# Patient Record
Sex: Female | Born: 1990 | Race: White | Hispanic: Yes | Smoking: Former smoker
Health system: Southern US, Community
[De-identification: ages and names within clinical notes are randomized; demographics above are authoritative.]

## PROBLEM LIST (undated history)

## (undated) ENCOUNTER — Inpatient Hospital Stay (HOSPITAL_COMMUNITY): Payer: Self-pay

## (undated) DIAGNOSIS — J45909 Unspecified asthma, uncomplicated: Secondary | ICD-10-CM

## (undated) DIAGNOSIS — O139 Gestational [pregnancy-induced] hypertension without significant proteinuria, unspecified trimester: Secondary | ICD-10-CM

## (undated) DIAGNOSIS — G43909 Migraine, unspecified, not intractable, without status migrainosus: Secondary | ICD-10-CM

## (undated) DIAGNOSIS — M1990 Unspecified osteoarthritis, unspecified site: Secondary | ICD-10-CM

## (undated) HISTORY — DX: Gestational (pregnancy-induced) hypertension without significant proteinuria, unspecified trimester: O13.9

## (undated) HISTORY — PX: TONSILLECTOMY/ADENOIDECTOMY/TURBINATE REDUCTION: SHX6126

## (undated) HISTORY — PX: TONSILLECTOMY AND ADENOIDECTOMY: SUR1326

---
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
## 2011-01-30 ENCOUNTER — Other Ambulatory Visit: Payer: Self-pay | Admitting: Family Medicine

## 2011-01-30 DIAGNOSIS — Z3682 Encounter for antenatal screening for nuchal translucency: Secondary | ICD-10-CM

## 2011-01-30 LAB — HIV ANTIBODY (ROUTINE TESTING W REFLEX): HIV: NONREACTIVE

## 2011-01-30 LAB — RUBELLA ANTIBODY, IGM: Rubella: IMMUNE

## 2011-01-30 LAB — HEPATITIS B SURFACE ANTIGEN: Hepatitis B Surface Ag: NEGATIVE

## 2011-01-30 LAB — ABO/RH: RH Type: POSITIVE

## 2011-02-15 ENCOUNTER — Encounter (HOSPITAL_COMMUNITY): Payer: Self-pay

## 2011-02-15 ENCOUNTER — Ambulatory Visit (HOSPITAL_COMMUNITY)
Admission: RE | Admit: 2011-02-15 | Discharge: 2011-02-15 | Disposition: A | Discharge status: Home / Self Care | Payer: Medicaid Other | Source: Ambulatory Visit | Attending: Family Medicine | Admitting: Family Medicine

## 2011-02-15 ENCOUNTER — Ambulatory Visit (HOSPITAL_COMMUNITY): Admission: RE | Admit: 2011-02-15 | Payer: Medicaid Other | Source: Ambulatory Visit

## 2011-02-15 ENCOUNTER — Other Ambulatory Visit: Payer: Self-pay | Admitting: Family Medicine

## 2011-02-15 VITALS — BP 119/80 | HR 65 | Wt 172.0 lb

## 2011-02-15 DIAGNOSIS — Z3682 Encounter for antenatal screening for nuchal translucency: Secondary | ICD-10-CM

## 2011-02-15 DIAGNOSIS — Z3689 Encounter for other specified antenatal screening: Secondary | ICD-10-CM | POA: Insufficient documentation

## 2011-02-15 NOTE — Progress Notes (Signed)
Ultrasound in [facility]/OBGYN/EPIC.  Follow up U/S scheduled 

## 2011-03-01 ENCOUNTER — Ambulatory Visit (HOSPITAL_COMMUNITY)
Admission: RE | Admit: 2011-03-01 | Discharge: 2011-03-01 | Disposition: A | Discharge status: Home / Self Care | Payer: Medicaid Other | Source: Ambulatory Visit | Attending: Family Medicine | Admitting: Family Medicine

## 2011-03-01 ENCOUNTER — Other Ambulatory Visit (HOSPITAL_COMMUNITY): Payer: Self-pay | Admitting: Obstetrics and Gynecology

## 2011-03-01 ENCOUNTER — Ambulatory Visit (HOSPITAL_COMMUNITY): Admission: RE | Admit: 2011-03-01 | Payer: Medicaid Other | Source: Ambulatory Visit

## 2011-03-01 VITALS — BP 126/84 | HR 76

## 2011-03-01 DIAGNOSIS — Z3682 Encounter for antenatal screening for nuchal translucency: Secondary | ICD-10-CM

## 2011-03-01 DIAGNOSIS — O351XX0 Maternal care for (suspected) chromosomal abnormality in fetus, not applicable or unspecified: Secondary | ICD-10-CM | POA: Insufficient documentation

## 2011-03-01 DIAGNOSIS — O3510X0 Maternal care for (suspected) chromosomal abnormality in fetus, unspecified, not applicable or unspecified: Secondary | ICD-10-CM | POA: Insufficient documentation

## 2011-03-01 DIAGNOSIS — Z3689 Encounter for other specified antenatal screening: Secondary | ICD-10-CM | POA: Insufficient documentation

## 2011-03-01 NOTE — Progress Notes (Signed)
Ultrasound in [facility]/OBGYN/EPIC.  Follow up U/S scheduled 

## 2011-03-05 LAB — CBC
HCT: 38.8
Hemoglobin: 13.4
MCV: 90.1
Platelets: 320
RDW: 13.1

## 2011-03-07 LAB — HEPATIC FUNCTION PANEL
ALT: 67 — ABNORMAL HIGH
AST: 81 — ABNORMAL HIGH
Bilirubin, Direct: <0.1
Total Bilirubin: 0.6

## 2011-03-07 LAB — CBC
HCT: 38.4
Hemoglobin: 13.2
MCV: 88.2
Platelets: 283
RDW: 13.5

## 2011-03-07 LAB — DIFFERENTIAL
Basophils Absolute: 0
Eosinophils Absolute: 0.2
Eosinophils Relative: 1
Lymphocytes Relative: 17 — ABNORMAL LOW
Lymphs Abs: 2.3
Monocytes Absolute: 0.8

## 2011-03-07 LAB — POCT I-STAT, CHEM 8
Calcium, Ion: 1.17
Glucose, Bld: 105 — ABNORMAL HIGH
HCT: 40
Hemoglobin: 13.6

## 2011-03-07 LAB — POCT PREGNANCY, URINE: Operator id: 277751

## 2011-03-07 NOTE — Progress Notes (Signed)
Encounter addended by: Marlana Latus, RN on: 03/07/2011 10:09 AM<BR>     Documentation filed: Episodes, Chief Complaint Section

## 2011-03-08 ENCOUNTER — Ambulatory Visit (HOSPITAL_COMMUNITY): Payer: Medicaid Other

## 2011-03-11 ENCOUNTER — Ambulatory Visit (HOSPITAL_COMMUNITY)
Admission: RE | Admit: 2011-03-11 | Discharge: 2011-03-11 | Disposition: A | Discharge status: Home / Self Care | Payer: Medicaid Other | Source: Ambulatory Visit | Attending: Family Medicine | Admitting: Family Medicine

## 2011-03-11 ENCOUNTER — Other Ambulatory Visit: Payer: Self-pay

## 2011-03-11 DIAGNOSIS — O3510X0 Maternal care for (suspected) chromosomal abnormality in fetus, unspecified, not applicable or unspecified: Secondary | ICD-10-CM | POA: Insufficient documentation

## 2011-03-11 DIAGNOSIS — Z3682 Encounter for antenatal screening for nuchal translucency: Secondary | ICD-10-CM

## 2011-03-11 DIAGNOSIS — Z3689 Encounter for other specified antenatal screening: Secondary | ICD-10-CM | POA: Insufficient documentation

## 2011-03-11 DIAGNOSIS — Z331 Pregnant state, incidental: Secondary | ICD-10-CM

## 2011-03-11 DIAGNOSIS — O351XX0 Maternal care for (suspected) chromosomal abnormality in fetus, not applicable or unspecified: Secondary | ICD-10-CM | POA: Insufficient documentation

## 2011-03-11 NOTE — Progress Notes (Signed)
Ultrasound in [facility]/OBGYN/EPIC.  Follow up U/S scheduled 

## 2011-03-11 NOTE — Progress Notes (Signed)
Encounter addended by: Marlana Latus, RN on: 03/11/2011  1:14 PM<BR>     Documentation filed: Charges VN

## 2011-04-22 ENCOUNTER — Other Ambulatory Visit: Payer: Self-pay | Admitting: Family Medicine

## 2011-04-22 ENCOUNTER — Ambulatory Visit (HOSPITAL_COMMUNITY)
Admission: RE | Admit: 2011-04-22 | Discharge: 2011-04-22 | Disposition: A | Discharge status: Home / Self Care | Payer: Medicaid Other | Source: Ambulatory Visit | Attending: Family Medicine | Admitting: Family Medicine

## 2011-04-22 ENCOUNTER — Other Ambulatory Visit (HOSPITAL_COMMUNITY): Payer: Self-pay | Admitting: Family Medicine

## 2011-04-22 DIAGNOSIS — E669 Obesity, unspecified: Secondary | ICD-10-CM | POA: Insufficient documentation

## 2011-04-22 DIAGNOSIS — Z331 Pregnant state, incidental: Secondary | ICD-10-CM

## 2011-04-22 DIAGNOSIS — O358XX0 Maternal care for other (suspected) fetal abnormality and damage, not applicable or unspecified: Secondary | ICD-10-CM | POA: Insufficient documentation

## 2011-04-22 DIAGNOSIS — Z1389 Encounter for screening for other disorder: Secondary | ICD-10-CM | POA: Insufficient documentation

## 2011-04-22 DIAGNOSIS — Z363 Encounter for antenatal screening for malformations: Secondary | ICD-10-CM | POA: Insufficient documentation

## 2011-05-17 ENCOUNTER — Encounter (HOSPITAL_COMMUNITY): Payer: Self-pay | Admitting: Physical Medicine and Rehabilitation

## 2011-05-17 ENCOUNTER — Emergency Department (HOSPITAL_COMMUNITY): Payer: Medicaid Other

## 2011-05-17 ENCOUNTER — Emergency Department (HOSPITAL_COMMUNITY)
Admission: EM | Admit: 2011-05-17 | Discharge: 2011-05-17 | Disposition: A | Discharge status: Home / Self Care | Payer: Medicaid Other

## 2011-05-17 ENCOUNTER — Emergency Department (HOSPITAL_COMMUNITY)
Admission: EM | Admit: 2011-05-17 | Discharge: 2011-05-17 | Disposition: A | Discharge status: Home / Self Care | Payer: Medicaid Other | Attending: Emergency Medicine | Admitting: Emergency Medicine

## 2011-05-17 DIAGNOSIS — R059 Cough, unspecified: Secondary | ICD-10-CM | POA: Insufficient documentation

## 2011-05-17 DIAGNOSIS — R05 Cough: Secondary | ICD-10-CM | POA: Insufficient documentation

## 2011-05-17 DIAGNOSIS — J069 Acute upper respiratory infection, unspecified: Secondary | ICD-10-CM | POA: Insufficient documentation

## 2011-05-17 DIAGNOSIS — R509 Fever, unspecified: Secondary | ICD-10-CM | POA: Insufficient documentation

## 2011-05-17 DIAGNOSIS — R109 Unspecified abdominal pain: Secondary | ICD-10-CM | POA: Insufficient documentation

## 2011-05-17 DIAGNOSIS — O99891 Other specified diseases and conditions complicating pregnancy: Secondary | ICD-10-CM | POA: Insufficient documentation

## 2011-05-17 DIAGNOSIS — J40 Bronchitis, not specified as acute or chronic: Secondary | ICD-10-CM | POA: Insufficient documentation

## 2011-05-17 DIAGNOSIS — R6889 Other general symptoms and signs: Secondary | ICD-10-CM | POA: Insufficient documentation

## 2011-05-17 LAB — BASIC METABOLIC PANEL
BUN: 6 mg/dL (ref 6–23)
CO2: 23 mEq/L (ref 19–32)
Chloride: 100 mEq/L (ref 96–112)
GFR calc Af Amer: >90 mL/min (ref >90)
Glucose, Bld: 101 mg/dL — ABNORMAL HIGH (ref 70–99)
Potassium: 3.7 mEq/L (ref 3.5–5.1)

## 2011-05-17 LAB — URINALYSIS, ROUTINE W REFLEX MICROSCOPIC
Bilirubin Urine: NEGATIVE
Ketones, ur: 15 mg/dL — AB
Nitrite: NEGATIVE
Protein, ur: NEGATIVE mg/dL
Urobilinogen, UA: 1 mg/dL (ref 0.0–1.0)

## 2011-05-17 LAB — URINE MICROSCOPIC-ADD ON

## 2011-05-17 MED ORDER — SODIUM CHLORIDE 0.9 % IV BOLUS (SEPSIS)
1000.0000 mL | Freq: Once | INTRAVENOUS | Status: AC
  Administered 2011-05-17: 1000 mL via INTRAVENOUS

## 2011-05-17 MED ORDER — BENZONATATE 100 MG PO CAPS: 100.0000 mg | ORAL_CAPSULE | Freq: Three times a day (TID) | ORAL | Status: AC

## 2011-05-17 NOTE — ED Provider Notes (Signed)
History     CSN: [id number] Arrival date & time: 05/17/2011  3:23 PM   First MD Initiated Contact with Patient 05/17/11 1752      Chief Complaint  Patient presents with  . URI  . Abdominal Pain    (Consider location/radiation/quality/duration/timing/severity/associated sxs/prior treatment) Patient is a 20 y.o. female presenting with URI and abdominal pain. The history is provided by the patient.  URI The primary symptoms include fever, cough and abdominal pain. Primary symptoms do not include headaches, nausea, vomiting or rash.  The illness is not associated with chills or congestion.  Abdominal Pain The primary symptoms of the illness include abdominal pain and fever. The primary symptoms of the illness do not include shortness of breath, nausea, vomiting or dysuria.  Symptoms associated with the illness do not include chills, diaphoresis or back pain.   the patient is a 20 year old, female, who is 82 weeks estimated gestational age, who presents to the hospital complaining of cough and sneezing, which is causing her to have abdominal pain.  She had a fever.  Prior to arrival.  She denies vomiting, diarrhea, or urinary tract symptoms.  She denies pain in her abdomen except when she coughs or sneezes.  She only takes prenatal vitamins.  She has no allergies to medications.  History reviewed. No pertinent past medical history.  Past Surgical History  Procedure Date  . Cholecystectomy   . Tonsillectomy     History reviewed. No pertinent family history.  History  Substance Use Topics  . Smoking status: Former Games developer  . Smokeless tobacco: Not on file  . Alcohol Use: No    OB History    Grav Para Term Preterm Abortions TAB SAB Ect Mult Living   2 1 1  0 0 0 0 0 0 1      Review of Systems  Constitutional: Positive for fever. Negative for chills and diaphoresis.  HENT: Negative for congestion and neck pain.   Eyes: Negative for redness.  Respiratory: Positive for cough.  Negative for chest tightness and shortness of breath.   Cardiovascular: Negative for chest pain.  Gastrointestinal: Positive for abdominal pain. Negative for nausea and vomiting.       Abdominal pain, only occurs when she coughs or sneezes  Genitourinary: Negative for dysuria.  Musculoskeletal: Negative for back pain.  Skin: Negative for rash.  Neurological: Negative for light-headedness, numbness and headaches.  Psychiatric/Behavioral: Negative for confusion.    Allergies  Review of patient's allergies indicates no known allergies.  Home Medications   Current Outpatient Rx  Name Route Sig Dispense Refill  . PRENATAL VITAMINS PO Oral Take by mouth.        BP 137/92  Pulse 112  Temp(Src) 98.1 F (36.7 C) (Oral)  Resp 18  SpO2 99%  LMP 11/23/2010  Physical Exam  Vitals reviewed. Constitutional: She is oriented to person, place, and time. She appears well-developed and well-nourished.  HENT:  Head: Normocephalic and atraumatic.  Eyes: Pupils are equal, round, and reactive to light.  Neck: Normal range of motion.  Cardiovascular: Regular rhythm and normal heart sounds.   No murmur heard.      Tachycardic  Pulmonary/Chest: Effort normal and breath sounds normal. No respiratory distress. She has no wheezes. She has no rales.  Abdominal:       gravid  Musculoskeletal: Normal range of motion. She exhibits no edema and no tenderness.  Neurological: She is alert and oriented to person, place, and time. No cranial nerve deficit.  Skin:  Skin is warm and dry. No rash noted. No erythema.  Psychiatric: She has a normal mood and affect. Her behavior is normal.    ED Course  Procedures (including critical care time)  20 year old, female, who is pregnant at approximately 72 weeks estimated gestational age.  Comes in with flulike symptoms.  We'll perform a chest x-ray to make sure she does not have a pneumonia since he tachycardic, old give her IV fluids, also do a blood and urine  tests, and determine whether or not.  She has signs of dehydration and ketones.  Labs Reviewed  URINALYSIS, ROUTINE W REFLEX MICROSCOPIC - Abnormal; Notable for the following:    Color, Urine AMBER (*) BIOCHEMICALS MAY BE AFFECTED BY COLOR   Ketones, ur 15 (*)    Leukocytes, UA TRACE (*)    All other components within normal limits  POCT PREGNANCY, URINE  URINE MICROSCOPIC-ADD ON  BASIC METABOLIC PANEL   I spoke with Dr. Vickey Sages.  She confirmed that I can give benzonatate to the pt to suppress her cough.    2 L IVF given.    MDM  URI Dehydration Pregnancy Nontoxic.  No signs of pneumonia.  No respiratory distress        Nicholes Stairs, MD 05/17/11 2204

## 2011-05-17 NOTE — ED Notes (Signed)
Pt presents to department for evaluation of cough, congestion, and abdominal pain. Symptoms started while sleeping around 4am. Pt states she has abdominal pain with coughing. Also states "I have a fever inside of me." states body aches and chills. Pt states she is x23 weeks pregnant. She is alert and oriented x4. No signs of distress at the time.

## 2011-05-17 NOTE — ED Notes (Signed)
FAMILY MEMBER IN WAITING CALLED BACK TO RM.

## 2011-06-11 NOTE — L&D Delivery Note (Signed)
Delivery Note At 6:51 PM a viable and healthy female was delivered via Vaginal, Spontaneous Delivery (Presentation: Left Occiput Anterior). After delivery of head, left axilla was grasped and rotated obliquely, at that point the anterior shoulder released and the baby delivered. APGAR: 9,9 ; weight 8lb 1 oz.  Placenta status: intact, .  Cord: 3 vessel with the following complications: none  Anesthesia: Epidural  Episiotomy: none Lacerations: 1st degree Est. Blood Loss (mL): 100  Mom to postpartum.  Baby to nursery-stable.  Drenda Freeze Cresenzo-Dishmon supervised the delivery.  Ala Dach 08/28/2011, 7:05 PM

## 2011-08-07 ENCOUNTER — Inpatient Hospital Stay (HOSPITAL_COMMUNITY)
Admission: AD | Admit: 2011-08-07 | Discharge: 2011-08-07 | Disposition: A | Discharge status: Home Health | Payer: Medicaid Other | Source: Ambulatory Visit | Attending: Obstetrics and Gynecology | Admitting: Obstetrics and Gynecology

## 2011-08-07 ENCOUNTER — Encounter (HOSPITAL_COMMUNITY): Payer: Self-pay | Admitting: *Deleted

## 2011-08-07 DIAGNOSIS — O479 False labor, unspecified: Secondary | ICD-10-CM

## 2011-08-07 DIAGNOSIS — R109 Unspecified abdominal pain: Secondary | ICD-10-CM | POA: Insufficient documentation

## 2011-08-07 DIAGNOSIS — O99891 Other specified diseases and conditions complicating pregnancy: Secondary | ICD-10-CM | POA: Insufficient documentation

## 2011-08-07 DIAGNOSIS — R1084 Generalized abdominal pain: Secondary | ICD-10-CM

## 2011-08-07 LAB — URINALYSIS, ROUTINE W REFLEX MICROSCOPIC
Bilirubin Urine: NEGATIVE
Glucose, UA: NEGATIVE mg/dL
Hgb urine dipstick: NEGATIVE
Ketones, ur: NEGATIVE mg/dL
Specific Gravity, Urine: <1.005 — ABNORMAL LOW (ref 1.005–1.030)
pH: 6 (ref 5.0–8.0)

## 2011-08-07 MED ORDER — NIFEDIPINE 10 MG PO CAPS: 10.0000 mg | ORAL_CAPSULE | Freq: Once | ORAL | Status: DC

## 2011-08-07 MED ORDER — NIFEDIPINE 10 MG PO CAPS
10.0000 mg | ORAL_CAPSULE | Freq: Once | ORAL | Status: AC
  Administered 2011-08-07: 10 mg via ORAL
  Filled 2011-08-07: qty 1

## 2011-08-07 NOTE — ED Provider Notes (Signed)
History     Chief Complaint  Patient presents with  . Abdominal Pain   HPI chief complaint is vaginal discharge. This 21 year old gravida 2 para 1001 at 34 weeks 3 days is seen for increased vaginal discharge gush of fluid or bleeding. She denies uterine contractions, she is concerned about the secretions. Last sexual activity is greater than one month. She has no history of preterm deliveries pregnancy care has been regular at the health department and was reported as normal without risk noted .  Pertinent Gynecological History: Menses: Pregnant Bleeding: None  History reviewed. No pertinent past medical history.  Past Surgical History  Procedure Date  . Cholecystectomy   . Tonsillectomy     History reviewed. No pertinent family history.  History  Substance Use Topics  . Smoking status: Former Games developer  . Smokeless tobacco: Never Used  . Alcohol Use: No    Allergies: No Known Allergies  Prescriptions prior to admission  Medication Sig Dispense Refill  . Prenatal Vit-Fe Fumarate-FA (PRENATAL MULTIVITAMIN) TABS Take 1 tablet by mouth every morning.        ROS Physical Exam    Blood pressure 134/89, pulse 96, resp. rate 20, last menstrual period 11/23/2010.  Physical ExamPhysical Examination: General appearance - alert, well appearing, and in no distress Mental status - normal mood, behavior, speech, dress, motor activity, and thought processes Abdomen - soft, nontender, nondistended, no masses or organomegaly Nontender uterus. Fetal monitoring shows reactive pattern category 1 fetal heart tracing there is no uterine activity Pelvic - VULVA: normal appearing vulva with no masses, tenderness or lesions, VAGINA: normal appearing vagina with normal color and discharge, no lesions, vaginal discharge - clear, white and copious, CERVIX: normal appearing cervix without discharge or lesions, soft multiparous digital exam shows cervix to be 1-2 cm posteriorly oriented soft 20  percent effaced, GC chlamydia and group B strep cultures were collected  UTERUS: enlarged to 34 week size, nontender   MAU Course  Procedures fetal monitoring and speculum exam, collection of cultures GC chlamydia and group B strep   MDM   Assessment and Plan   pregnancy 34 weeks 3 days, cervical ripening late preterm gestation, with no evidence of preterm labor, no evidence of membrane rupture.  Recommendations pelvic rest with avoidance of sexual activity Limited activity, weekly followup through the health department   reevaluation promptly here in MAU for any contractions bleeding or fluid Tiana Sivertson V 08/07/2011, 10:56 PM

## 2011-08-07 NOTE — Progress Notes (Signed)
Pt G2 P1 at 34.3wks having lower abd pain that comes and goes, denies bleeding.  Reports clear mucous today.

## 2011-08-09 LAB — GC/CHLAMYDIA PROBE AMP, GENITAL: Chlamydia, DNA Probe: NEGATIVE

## 2011-08-10 LAB — CULTURE, BETA STREP (GROUP B ONLY)

## 2011-08-21 LAB — STREP B DNA PROBE: GBS: NEGATIVE

## 2011-08-26 ENCOUNTER — Inpatient Hospital Stay (HOSPITAL_COMMUNITY)
Admission: AD | Admit: 2011-08-26 | Discharge: 2011-08-26 | Disposition: A | Discharge status: Home / Self Care | Payer: Medicaid Other | Source: Ambulatory Visit | Attending: Obstetrics & Gynecology | Admitting: Obstetrics & Gynecology

## 2011-08-26 ENCOUNTER — Encounter (HOSPITAL_COMMUNITY): Payer: Self-pay

## 2011-08-26 DIAGNOSIS — O133 Gestational [pregnancy-induced] hypertension without significant proteinuria, third trimester: Secondary | ICD-10-CM

## 2011-08-26 DIAGNOSIS — R1013 Epigastric pain: Secondary | ICD-10-CM | POA: Insufficient documentation

## 2011-08-26 DIAGNOSIS — O139 Gestational [pregnancy-induced] hypertension without significant proteinuria, unspecified trimester: Secondary | ICD-10-CM | POA: Insufficient documentation

## 2011-08-26 LAB — URIC ACID: Uric Acid, Serum: 3.6 mg/dL (ref 2.4–7.0)

## 2011-08-26 LAB — PROTEIN / CREATININE RATIO, URINE
Creatinine, Urine: 123.5 mg/dL
Protein Creatinine Ratio: 0.08 (ref 0.00–0.15)
Total Protein, Urine: 9.9 mg/dL

## 2011-08-26 LAB — URINALYSIS, ROUTINE W REFLEX MICROSCOPIC
Glucose, UA: NEGATIVE mg/dL
Hgb urine dipstick: NEGATIVE
Leukocytes, UA: NEGATIVE
Protein, ur: NEGATIVE mg/dL
pH: 7 (ref 5.0–8.0)

## 2011-08-26 LAB — COMPREHENSIVE METABOLIC PANEL
ALT: 16 U/L (ref 0–35)
AST: 18 U/L (ref 0–37)
Calcium: 9.4 mg/dL (ref 8.4–10.5)
Creatinine, Ser: 0.52 mg/dL (ref 0.50–1.10)
GFR calc Af Amer: >90 mL/min (ref >90)
Sodium: 135 mEq/L (ref 135–145)
Total Protein: 6.2 g/dL (ref 6.0–8.3)

## 2011-08-26 LAB — CBC
HCT: 36.2 % (ref 36.0–46.0)
Hemoglobin: 12.3 g/dL (ref 12.0–15.0)
MCH: 29.7 pg (ref 26.0–34.0)
MCHC: 34 g/dL (ref 30.0–36.0)
RBC: 4.14 MIL/uL (ref 3.87–5.11)

## 2011-08-26 MED ORDER — FAMOTIDINE 20 MG PO TABS
20.0000 mg | ORAL_TABLET | ORAL | Status: AC
  Administered 2011-08-26: 20 mg via ORAL
  Filled 2011-08-26: qty 1

## 2011-08-26 NOTE — MAU Note (Signed)
Patient states she had a sudden onset of upper abdominal pain about 30 minutes ago. Denies any contractions, leaking, or bleeding. Reports good fetal movement. No headaches but has had some "bubbles" before her eyes.

## 2011-08-26 NOTE — MAU Provider Note (Signed)
History     CSN: [id number]  Arrival date and time: 08/26/11 1830   First Provider Initiated Contact with Patient 08/26/11 1954     20 [id number].N.[id number]@ [redacted]w[redacted]d Chief Complaint  Patient presents with  . Abdominal Pain   HPI Pt presents with epigastric pain in upper abdomen/sternal area radiating to RUQ of abdomen.  This pain started about 1.5 hours ago.  She had "floating dots" in her vision 1 week ago that resolved.  She reports good fetal movement and denies contractions, LOF, vaginal bleeding, vaginal itching/burning/unusual discharge, urinary symptoms, h/a, dizziness, n/v, or fever/chills.    OB History    Grav Para Term Preterm Abortions TAB SAB Ect Mult Living   2 1 1  0 0 0 0 0 0 1      Past Medical History  Diagnosis Date  . No pertinent past medical history     Past Surgical History  Procedure Date  . Cholecystectomy   . Tonsillectomy   . Nasal septum surgery     Family History  Problem Relation Age of Onset  . Anesthesia problems Neg Hx     History  Substance Use Topics  . Smoking status: Former Games developer  . Smokeless tobacco: Never Used  . Alcohol Use: No    Allergies: No Known Allergies  Prescriptions prior to admission  Medication Sig Dispense Refill  . Prenatal Vit-Fe Fumarate-FA (PRENATAL MULTIVITAMIN) TABS Take 1 tablet by mouth every morning.        Review of Systems  Constitutional: Negative for fever, chills and malaise/fatigue.  Eyes: Negative for blurred vision.  Respiratory: Positive for shortness of breath. Negative for cough.   Cardiovascular: Negative for chest pain.  Gastrointestinal: Positive for heartburn and abdominal pain. Negative for nausea and vomiting.  Genitourinary: Negative for dysuria, urgency and frequency.  Musculoskeletal: Negative.   Neurological: Negative for dizziness and headaches.  Psychiatric/Behavioral: Negative for depression.   Physical Exam   Blood pressure 137/98, pulse 94, temperature 98.5 F (36.9 C),  temperature source Oral, resp. rate 20, height 5\' 5"  (1.651 m), weight 87.998 kg (194 lb), last menstrual period 11/23/2010, SpO2 98.00%.  Physical Exam  Nursing note and vitals reviewed. Constitutional: She is oriented to person, place, and time. She appears well-developed and well-nourished.  Neck: Normal range of motion.  Cardiovascular: Normal rate, regular rhythm and normal heart sounds.   Respiratory: Effort normal and breath sounds normal.  GI: Soft.  Musculoskeletal: Normal range of motion.  Neurological: She is alert and oriented to person, place, and time. She has normal reflexes.  Skin: Skin is warm and dry.  Psychiatric: She has a normal mood and affect. Her behavior is normal. Judgment and thought content normal.    MAU Course  Procedures U/A, P/C ratio, CBC, CMP    Assessment and Plan    LEFTWICH-KIRBY, LISA 08/26/2011, 7:54 PM   Care assumed by Dorathy Kinsman, CNM  Care of pt assumed at 2100.  Results for orders placed during the hospital encounter of 08/26/11 (from the past 24 hour(s))  URINALYSIS, ROUTINE W REFLEX MICROSCOPIC     Status: Normal   Collection Time   08/26/11  6:50 PM      Component Value Range   Color, Urine YELLOW  YELLOW    APPearance CLEAR  CLEAR    Specific Gravity, Urine 1.020  1.005 - 1.030    pH 7.0  5.0 - 8.0    Glucose, UA NEGATIVE  NEGATIVE (mg/dL)   Hgb urine dipstick NEGATIVE  NEGATIVE    Bilirubin Urine NEGATIVE  NEGATIVE    Ketones, ur NEGATIVE  NEGATIVE (mg/dL)   Protein, ur NEGATIVE  NEGATIVE (mg/dL)   Urobilinogen, UA 1.0  0.0 - 1.0 (mg/dL)   Nitrite NEGATIVE  NEGATIVE    Leukocytes, UA NEGATIVE  NEGATIVE   PROTEIN / CREATININE RATIO, URINE     Status: Normal   Collection Time   08/26/11  6:50 PM      Component Value Range   Creatinine, Urine 123.50     Total Protein, Urine 9.9     PROTEIN CREATININE RATIO 0.08  0.00 - 0.15   COMPREHENSIVE METABOLIC PANEL     Status: Abnormal   Collection Time   08/26/11  7:26 PM       Component Value Range   Sodium 135  135 - 145 (mEq/L)   Potassium 3.7  3.5 - 5.1 (mEq/L)   Chloride 101  96 - 112 (mEq/L)   CO2 22  19 - 32 (mEq/L)   Glucose, Bld 101 (*) 70 - 99 (mg/dL)   BUN 8  6 - 23 (mg/dL)   Creatinine, Ser 0.98  0.50 - 1.10 (mg/dL)   Calcium 9.4  8.4 - 11.9 (mg/dL)   Total Protein 6.2  6.0 - 8.3 (g/dL)   Albumin 2.5 (*) 3.5 - 5.2 (g/dL)   AST 18  0 - 37 (U/L)   ALT 16  0 - 35 (U/L)   Alkaline Phosphatase 303 (*) 39 - 117 (U/L)   Total Bilirubin 0.2 (*) 0.3 - 1.2 (mg/dL)   GFR calc non Af Amer >90  >90 (mL/min)   GFR calc Af Amer >90  >90 (mL/min)  CBC     Status: Normal   Collection Time   08/26/11  7:26 PM      Component Value Range   WBC 8.9  4.0 - 10.5 (K/uL)   RBC 4.14  3.87 - 5.11 (MIL/uL)   Hemoglobin 12.3  12.0 - 15.0 (g/dL)   HCT 14.7  82.9 - 56.2 (%)   MCV 87.4  78.0 - 100.0 (fL)   MCH 29.7  26.0 - 34.0 (pg)   MCHC 34.0  30.0 - 36.0 (g/dL)   RDW 13.0  86.5 - 78.4 (%)   Platelets 247  150 - 400 (K/uL)  URIC ACID     Status: Normal   Collection Time   08/26/11  7:26 PM      Component Value Range   Uric Acid, Serum 3.6  2.4 - 7.0 (mg/dL)   Pt reports mild, sharp, intermittent RUQ pain.   Patient Vitals for the past 24 hrs:  BP Temp Temp src Pulse Resp SpO2 Height Weight  08/26/11 2032 139/98 mmHg - - 89  - - - -  08/26/11 2017 142/112 mmHg - - 103  - - - -  08/26/11 2002 134/98 mmHg - - 104  - - - -  08/26/11 1947 128/91 mmHg - - 125  - - - -  08/26/11 1932 137/98 mmHg - - 94  - - - -  08/26/11 1917 145/92 mmHg - - 94  - - - -  08/26/11 1903 136/95 mmHg - - 104  - - - -  08/26/11 1841 137/102 mmHg 98.5 F (36.9 C) Oral 76  20  98 % 5\' 5"  (1.651 m) 87.998 kg (194 lb)   Dilation: 4 Effacement (%): 80 Cervical Position: Posterior Station: -3 Presentation: Vertex Exam by:: Ivonne Andrew, CNM  1. Gestational hypertension w/o  significant proteinuria in 3rd trimester    Discussed labs, BPs w/ Dr. Debroah Loop. Will send home w/ 24 hour urine  collections kit.  Instructed to return to [facility] 08/28/11. PIH and labor precautions Dot Been 08/26/2011 9:48 PM

## 2011-08-28 ENCOUNTER — Encounter (HOSPITAL_COMMUNITY): Payer: Self-pay | Admitting: Anesthesiology

## 2011-08-28 ENCOUNTER — Inpatient Hospital Stay (HOSPITAL_COMMUNITY)
Admission: AD | Admit: 2011-08-28 | Discharge: 2011-08-30 | DRG: 775 | Disposition: A | Discharge status: Home / Self Care | Payer: Medicaid Other | Attending: Obstetrics & Gynecology | Admitting: Obstetrics & Gynecology

## 2011-08-28 ENCOUNTER — Inpatient Hospital Stay (HOSPITAL_COMMUNITY): Payer: Medicaid Other | Admitting: Anesthesiology

## 2011-08-28 ENCOUNTER — Encounter (HOSPITAL_COMMUNITY): Payer: Self-pay | Admitting: *Deleted

## 2011-08-28 LAB — CBC
HCT: 36 % (ref 36.0–46.0)
MCHC: 34.4 g/dL (ref 30.0–36.0)
Platelets: 232 10*3/uL (ref 150–400)
RDW: 13.2 % (ref 11.5–15.5)
WBC: 10.1 10*3/uL (ref 4.0–10.5)

## 2011-08-28 LAB — COMPREHENSIVE METABOLIC PANEL
ALT: 17 U/L (ref 0–35)
AST: 19 U/L (ref 0–37)
Albumin: 2.4 g/dL — ABNORMAL LOW (ref 3.5–5.2)
Alkaline Phosphatase: 320 U/L — ABNORMAL HIGH (ref 39–117)
BUN: 8 mg/dL (ref 6–23)
Chloride: 101 mEq/L (ref 96–112)
Potassium: 4 mEq/L (ref 3.5–5.1)
Sodium: 134 mEq/L — ABNORMAL LOW (ref 135–145)
Total Bilirubin: 0.3 mg/dL (ref 0.3–1.2)
Total Protein: 5.9 g/dL — ABNORMAL LOW (ref 6.0–8.3)

## 2011-08-28 LAB — PROTEIN, URINE, 24 HOUR
Protein, 24H Urine: 80 mg/d (ref 50–100)
Urine Total Volume-UPROT: 2650 mL

## 2011-08-28 LAB — CREATININE CLEARANCE, URINE, 24 HOUR
Collection Interval-CRCL: 24 hours
Creatinine, 24H Ur: 1128 mg/d (ref 700–1800)
Creatinine: 0.53 mg/dL (ref 0.50–1.10)
Urine Total Volume-CRCL: 2650 mL

## 2011-08-28 MED ORDER — OXYTOCIN 20 UNITS IN LACTATED RINGERS INFUSION - SIMPLE: 125.0000 mL/h | Freq: Once | INTRAVENOUS | Status: DC

## 2011-08-28 MED ORDER — METHYLERGONOVINE MALEATE 0.2 MG/ML IJ SOLN: 0.2000 mg | INTRAMUSCULAR | Status: DC | PRN

## 2011-08-28 MED ORDER — OXYTOCIN BOLUS FROM INFUSION
500.0000 mL | Freq: Once | INTRAVENOUS | Status: DC
  Filled 2011-08-28: qty 500

## 2011-08-28 MED ORDER — OXYCODONE-ACETAMINOPHEN 5-325 MG PO TABS: 1.0000 | ORAL_TABLET | ORAL | Status: DC | PRN

## 2011-08-28 MED ORDER — LACTATED RINGERS IV SOLN: INTRAVENOUS | Status: DC

## 2011-08-28 MED ORDER — DIPHENHYDRAMINE HCL 25 MG PO CAPS: 25.0000 mg | ORAL_CAPSULE | Freq: Four times a day (QID) | ORAL | Status: DC | PRN

## 2011-08-28 MED ORDER — LACTATED RINGERS IV SOLN: 500.0000 mL | INTRAVENOUS | Status: DC | PRN

## 2011-08-28 MED ORDER — EPHEDRINE 5 MG/ML INJ: 10.0000 mg | INTRAVENOUS | Status: DC | PRN

## 2011-08-28 MED ORDER — SENNOSIDES-DOCUSATE SODIUM 8.6-50 MG PO TABS
2.0000 | ORAL_TABLET | Freq: Every day | ORAL | Status: DC
  Administered 2011-08-28 – 2011-08-29 (×2): 2 via ORAL

## 2011-08-28 MED ORDER — EPHEDRINE 5 MG/ML INJ
10.0000 mg | INTRAVENOUS | Status: DC | PRN
  Filled 2011-08-28: qty 4

## 2011-08-28 MED ORDER — FENTANYL 2.5 MCG/ML BUPIVACAINE 1/10 % EPIDURAL INFUSION (WH - ANES)
14.0000 mL/h | INTRAMUSCULAR | Status: DC
  Administered 2011-08-28: 14 mL/h via EPIDURAL
  Filled 2011-08-28: qty 60

## 2011-08-28 MED ORDER — OXYTOCIN BOLUS FROM INFUSION: 500.0000 mL | Freq: Once | INTRAVENOUS | Status: DC

## 2011-08-28 MED ORDER — ONDANSETRON HCL 4 MG/2ML IJ SOLN: 4.0000 mg | INTRAMUSCULAR | Status: DC | PRN

## 2011-08-28 MED ORDER — PHENYLEPHRINE 40 MCG/ML (10ML) SYRINGE FOR IV PUSH (FOR BLOOD PRESSURE SUPPORT): 80.0000 ug | PREFILLED_SYRINGE | INTRAVENOUS | Status: DC | PRN

## 2011-08-28 MED ORDER — IBUPROFEN 600 MG PO TABS: 600.0000 mg | ORAL_TABLET | Freq: Four times a day (QID) | ORAL | Status: DC | PRN

## 2011-08-28 MED ORDER — LIDOCAINE HCL (PF) 1 % IJ SOLN
30.0000 mL | INTRAMUSCULAR | Status: DC | PRN
  Filled 2011-08-28: qty 30

## 2011-08-28 MED ORDER — OXYCODONE-ACETAMINOPHEN 5-325 MG PO TABS
1.0000 | ORAL_TABLET | ORAL | Status: DC | PRN
  Administered 2011-08-28: 1 via ORAL
  Filled 2011-08-28: qty 1

## 2011-08-28 MED ORDER — ACETAMINOPHEN 325 MG PO TABS: 650.0000 mg | ORAL_TABLET | ORAL | Status: DC | PRN

## 2011-08-28 MED ORDER — ONDANSETRON HCL 4 MG/2ML IJ SOLN: 4.0000 mg | Freq: Four times a day (QID) | INTRAMUSCULAR | Status: DC | PRN

## 2011-08-28 MED ORDER — TETANUS-DIPHTH-ACELL PERTUSSIS 5-2.5-18.5 LF-MCG/0.5 IM SUSP: 0.5000 mL | Freq: Once | INTRAMUSCULAR | Status: DC

## 2011-08-28 MED ORDER — LACTATED RINGERS IV SOLN
500.0000 mL | Freq: Once | INTRAVENOUS | Status: AC
  Administered 2011-08-28: 1000 mL via INTRAVENOUS

## 2011-08-28 MED ORDER — FENTANYL 2.5 MCG/ML BUPIVACAINE 1/10 % EPIDURAL INFUSION (WH - ANES)
INTRAMUSCULAR | Status: DC | PRN
  Administered 2011-08-28: 14 mL/h via EPIDURAL

## 2011-08-28 MED ORDER — SIMETHICONE 80 MG PO CHEW: 80.0000 mg | CHEWABLE_TABLET | ORAL | Status: DC | PRN

## 2011-08-28 MED ORDER — LIDOCAINE HCL (PF) 1 % IJ SOLN: 30.0000 mL | INTRAMUSCULAR | Status: DC | PRN

## 2011-08-28 MED ORDER — IBUPROFEN 600 MG PO TABS
600.0000 mg | ORAL_TABLET | Freq: Four times a day (QID) | ORAL | Status: DC | PRN
  Administered 2011-08-28: 600 mg via ORAL
  Filled 2011-08-28: qty 1

## 2011-08-28 MED ORDER — FLEET ENEMA 7-19 GM/118ML RE ENEM: 1.0000 | ENEMA | Freq: Every day | RECTAL | Status: DC | PRN

## 2011-08-28 MED ORDER — CITRIC ACID-SODIUM CITRATE 334-500 MG/5ML PO SOLN: 30.0000 mL | ORAL | Status: DC | PRN

## 2011-08-28 MED ORDER — ONDANSETRON HCL 4 MG PO TABS: 4.0000 mg | ORAL_TABLET | ORAL | Status: DC | PRN

## 2011-08-28 MED ORDER — OXYTOCIN 20 UNITS IN LACTATED RINGERS INFUSION - SIMPLE
1.0000 m[IU]/min | INTRAVENOUS | Status: DC
  Administered 2011-08-28: 2 m[IU]/min via INTRAVENOUS
  Administered 2011-08-28: 999 m[IU]/min via INTRAVENOUS
  Filled 2011-08-28: qty 1000

## 2011-08-28 MED ORDER — METHYLERGONOVINE MALEATE 0.2 MG PO TABS: 0.2000 mg | ORAL_TABLET | ORAL | Status: DC | PRN

## 2011-08-28 MED ORDER — DIPHENHYDRAMINE HCL 50 MG/ML IJ SOLN: 12.5000 mg | INTRAMUSCULAR | Status: DC | PRN

## 2011-08-28 MED ORDER — LACTATED RINGERS IV SOLN
INTRAVENOUS | Status: DC
  Administered 2011-08-28 (×2): via INTRAVENOUS

## 2011-08-28 MED ORDER — DIBUCAINE 1 % RE OINT: 1.0000 "application " | TOPICAL_OINTMENT | RECTAL | Status: DC | PRN

## 2011-08-28 MED ORDER — PHENYLEPHRINE 40 MCG/ML (10ML) SYRINGE FOR IV PUSH (FOR BLOOD PRESSURE SUPPORT)
80.0000 ug | PREFILLED_SYRINGE | INTRAVENOUS | Status: DC | PRN
  Filled 2011-08-28: qty 5

## 2011-08-28 MED ORDER — PRENATAL MULTIVITAMIN CH
1.0000 | ORAL_TABLET | Freq: Every day | ORAL | Status: DC
  Administered 2011-08-29 – 2011-08-30 (×2): 1 via ORAL
  Filled 2011-08-28 (×2): qty 1

## 2011-08-28 MED ORDER — BISACODYL 10 MG RE SUPP: 10.0000 mg | Freq: Every day | RECTAL | Status: DC | PRN

## 2011-08-28 MED ORDER — WITCH HAZEL-GLYCERIN EX PADS: 1.0000 "application " | MEDICATED_PAD | CUTANEOUS | Status: DC | PRN

## 2011-08-28 MED ORDER — TERBUTALINE SULFATE 1 MG/ML IJ SOLN: 0.2500 mg | Freq: Once | INTRAMUSCULAR | Status: DC | PRN

## 2011-08-28 MED ORDER — BENZOCAINE-MENTHOL 20-0.5 % EX AERO: 1.0000 "application " | INHALATION_SPRAY | CUTANEOUS | Status: DC | PRN

## 2011-08-28 MED ORDER — LIDOCAINE HCL (PF) 1 % IJ SOLN
INTRAMUSCULAR | Status: DC | PRN
  Administered 2011-08-28: 6 mL
  Administered 2011-08-28: 8 mL

## 2011-08-28 MED ORDER — HYDROCODONE-ACETAMINOPHEN 5-325 MG PO TABS
ORAL_TABLET | ORAL | Status: AC
  Filled 2011-08-28: qty 1

## 2011-08-28 MED ORDER — IBUPROFEN 600 MG PO TABS
600.0000 mg | ORAL_TABLET | Freq: Four times a day (QID) | ORAL | Status: DC
  Administered 2011-08-29 – 2011-08-30 (×5): 600 mg via ORAL
  Filled 2011-08-28: qty 2
  Filled 2011-08-28 (×3): qty 1

## 2011-08-28 MED ORDER — FLEET ENEMA 7-19 GM/118ML RE ENEM: 1.0000 | ENEMA | RECTAL | Status: DC | PRN

## 2011-08-28 MED ORDER — LABETALOL HCL 5 MG/ML IV SOLN
10.0000 mg | INTRAVENOUS | Status: DC
  Filled 2011-08-28 (×25): qty 4

## 2011-08-28 MED ORDER — LABETALOL HCL 5 MG/ML IV SOLN
10.0000 mg | INTRAVENOUS | Status: DC | PRN
  Administered 2011-08-28: 10 mg via INTRAVENOUS
  Filled 2011-08-28: qty 4

## 2011-08-28 MED ORDER — LANOLIN HYDROUS EX OINT: TOPICAL_OINTMENT | CUTANEOUS | Status: DC | PRN

## 2011-08-28 MED ORDER — ZOLPIDEM TARTRATE 5 MG PO TABS: 5.0000 mg | ORAL_TABLET | Freq: Every evening | ORAL | Status: DC | PRN

## 2011-08-28 NOTE — ED Provider Notes (Addendum)
  History     CSN: [id number]  Arrival date and time: 08/28/11 4098   None     Chief Complaint  Patient presents with  . Labor Eval   HPI Mrs. Greenstein [id number] presents with SOB and with a specimen for a 24 urine collection. Mrs Bazaldua was seen her on 3/18 with RUQ pain and high blood pressures. At this point she was sent home to collect a 24 urine which brings her here today. She describes starting to feel short of breath at 2:40 am this morning (3 hours ago). She denies hxt of asthma or sleep apnea. She also denies any vaginal bleeding or rupture of membranes. She also describes feeling round ligament pain as well as cervical pressure. On 3/18, she was dilated to 4 cm. She has brought her 24 hour urine specimen today.   OB History    Grav Para Term Preterm Abortions TAB SAB Ect Mult Living   2 1 1  0 0 0 0 0 0 1      Past Medical History  Diagnosis Date  . No pertinent past medical history     Past Surgical History  Procedure Date  . Cholecystectomy   . Tonsillectomy   . Nasal septum surgery     Family History  Problem Relation Age of Onset  . Anesthesia problems Neg Hx     History  Substance Use Topics  . Smoking status: Former Games developer  . Smokeless tobacco: Never Used  . Alcohol Use: No    Allergies: No Known Allergies  Prescriptions prior to admission  Medication Sig Dispense Refill  . Prenatal Vit-Fe Fumarate-FA (PRENATAL MULTIVITAMIN) TABS Take 1 tablet by mouth every morning.        Review of Systems  All other systems reviewed and are negative.   Physical Exam   Blood pressure 112/71, pulse 104, temperature 97.5 F (36.4 C), temperature source Oral, resp. rate 20, height 5\' 5"  (1.651 m), weight 192 lb (87.091 kg), last menstrual period 11/23/2010, SpO2 99.00%.  Physical Exam  Constitutional: She appears well-developed and well-nourished. No distress.  HENT:  Head: Normocephalic and atraumatic.  Cardiovascular: Normal rate, regular rhythm and  normal heart sounds.  Exam reveals no gallop.   No murmur heard. Respiratory: Effort normal and breath sounds normal. No respiratory distress. She has no wheezes. She has no rales.  Skin: She is not diaphoretic.    MAU Course  Procedures  Labs and 24 hour urine pending.  Assessment and Plan  1. IUP  37. 3 2. Shortness of Breath   3. Elevated BPs   Labs and 24 hour urine pending. Plan dicussed with Dr. Marice Potter.   Iverson Alamin  08/28/2011, 6:02 AM

## 2011-08-28 NOTE — Anesthesia Preprocedure Evaluation (Signed)
Anesthesia Evaluation  Patient identified by MRN, date of birth, ID band Patient awake    Reviewed: Allergy & Precautions, H&P , NPO status , Patient's Chart, lab work & pertinent test results  Airway Mallampati: II TM Distance: >3 FB Neck ROM: full    Dental No notable dental hx.    Pulmonary neg pulmonary ROS,  breath sounds clear to auscultation  Pulmonary exam normal       Cardiovascular negative cardio ROS      Neuro/Psych negative neurological ROS  negative psych ROS   GI/Hepatic negative GI ROS, Neg liver ROS,   Endo/Other  negative endocrine ROS  Renal/GU negative Renal ROS  negative genitourinary   Musculoskeletal negative musculoskeletal ROS (+)   Abdominal Normal abdominal exam  (+)   Peds negative pediatric ROS (+)  Hematology negative hematology ROS (+)   Anesthesia Other Findings   Reproductive/Obstetrics (+) Pregnancy                           Anesthesia Physical Anesthesia Plan  ASA: II  Anesthesia Plan: Epidural   Post-op Pain Management:    Induction:   Airway Management Planned:   Additional Equipment:   Intra-op Plan:   Post-operative Plan:   Informed Consent: I have reviewed the patients History and Physical, chart, labs and discussed the procedure including the risks, benefits and alternatives for the proposed anesthesia with the patient or authorized representative who has indicated his/her understanding and acceptance.     Plan Discussed with:   Anesthesia Plan Comments:         Anesthesia Quick Evaluation  

## 2011-08-28 NOTE — H&P (Signed)
Catherine Porter is a 21 y.o. female presenting for Contractions.She was evaluated in MAU two days ago for headaches and seeing spots, and sent home with a 24 hour urine which she has brought back.    History OB History    Grav Para Term Preterm Abortions TAB SAB Ect Mult Living   2 1 1  0 0 0 0 0 0 1     Past Medical History  Diagnosis Date  . No pertinent past medical history    Past Surgical History  Procedure Date  . Cholecystectomy   . Tonsillectomy   . Nasal septum surgery    Family History: family history is negative for Anesthesia problems. Social History:  reports that she has quit smoking. She has never used smokeless tobacco. She reports that she does not drink alcohol or use illicit drugs.  ROS  Dilation: 5.5 Effacement (%): 70 Station: -2 Exam by:: Catherine Porter Blood pressure 127/82, pulse 82, temperature 96.8 F (36 C), temperature source Oral, resp. rate 20, height 5\' 5"  (1.651 m), weight 87.091 kg (192 lb), last menstrual period 11/23/2010, SpO2 99.00%. Exam Physical Exam   Please see H & P by  Catherine Kirk, MD Prenatal labs: ABO, Rh: A+ Antibody: Neg Rubella: Immune RPR:  Neg HBsAg: Neg HIV: Neg GBS: Neg  Lab Results  Component Value Date   WBC 10.1 08/28/2011   HGB 12.4 08/28/2011   HCT 36.0 08/28/2011   MCV 87.4 08/28/2011   PLT 232 08/28/2011   Lab Results  Component Value Date   ALT 17 08/28/2011   AST 19 08/28/2011   ALKPHOS 320* 08/28/2011   BILITOT 0.3 08/28/2011   Lab Results  Component Value Date   NA 134* 08/28/2011   K 4.0 08/28/2011   CL 101 08/28/2011   CO2 22 08/28/2011   Lab Results  Component Value Date   CREATININE 0.53 08/28/2011     Assessment/Plan: Admit to L & D  Catherine Porter 08/28/2011, 9:44 AM

## 2011-08-28 NOTE — Anesthesia Procedure Notes (Signed)
Epidural Patient location during procedure: OB Start time: 08/28/2011 5:28 PM End time: 08/28/2011 5:33 PM Reason for block: procedure for pain  Staffing Anesthesiologist: Sandrea Hughs Performed by: anesthesiologist   Preanesthetic Checklist Completed: patient identified, site marked, surgical consent, pre-op evaluation, timeout performed, IV checked, risks and benefits discussed and monitors and equipment checked  Epidural Patient position: sitting Prep: site prepped and draped and DuraPrep Patient monitoring: continuous pulse ox and blood pressure Approach: midline Injection technique: LOR air  Needle:  Needle type: Tuohy  Needle gauge: 17 G Needle length: 9 cm Needle insertion depth: 5 cm cm Catheter type: closed end flexible Catheter size: 19 Gauge Catheter at skin depth: 10 cm Test dose: negative and Other  Assessment Sensory level: T8 Events: blood not aspirated, injection not painful, no injection resistance, negative IV test and no paresthesia

## 2011-08-28 NOTE — Progress Notes (Signed)
Subjective: Feeling regular contractions now. Epidural recently placed, not working well yet.  Objective: BP 145/99  Pulse 79  Temp(Src) 98.2 F (36.8 C) (Oral)  Resp 20  Ht 5\' 4"  (1.626 m)  Wt 87.091 kg (192 lb)  BMI 32.96 kg/m2  SpO2 98%  LMP 11/23/2010      FHT:  FHR: 145 bpm, variability: moderate,  accelerations:  Present,  decelerations:  Absent UC:   regular, every 1-2 minutes SVE:   Dilation: Lip/rim Effacement (%): 100 Station: 0 Exam by:: k fields, rn  Labs: Lab Results  Component Value Date   WBC 10.1 08/28/2011   HGB 12.4 08/28/2011   HCT 36.0 08/28/2011   MCV 87.4 08/28/2011   PLT 232 08/28/2011    Assessment / Plan: Augmentation of labor, progressing well  Labor: Progressing well on pit and s/p AROM Preeclampsia:  no s/sx, mildly elevated BP Fetal Wellbeing:  Category I Pain Control:  Epidural I/D:  n/a Anticipated MOD:  NSVD  Ala Dach 08/28/2011, 6:30 PM

## 2011-08-28 NOTE — H&P (Signed)
Catherine Porter is a 21 y.o. female presenting for f/u from MAU with 24 hour urine. Maternal Medical History:  Reason for admission: Reason for Admission:   nausea  Catherine Porter is a 21 y.o. G2P1001 who presents at 37 weeks 5 days as a follow up to the MAU from a visit on 3/18 for headache, vision change and RUQ pain. She collected a 24 hour urine and returned it this morning. On recheck, she was 5.5cm dilated with regular contractions. [facility] Feeling contractions every 5-10 minutes. No headaches, visual changes, RUQ pain, loss of fluid, bleeding or peripheral edema. + fetal movement. F/u [facility]ian will be [facility]. She will breastfeed. Birth control will likely be Mirena, although she hasn't completely decided yet. EDD 09/15/11 by [place].  OB History    Grav Para Term Preterm Abortions TAB SAB Ect Mult Living   2 1 1  0 0 0 0 0 0 1     Past Medical History  Diagnosis Date  . No pertinent past medical history    Past Surgical History  Procedure Date  . Cholecystectomy   . Tonsillectomy   . Nasal septum surgery    Family History: family history is negative for Anesthesia problems. Social History:  reports that she has quit smoking. She has never used smokeless tobacco. She reports that she does not drink alcohol or use illicit drugs.  Review of Systems  Constitutional: Negative for fever.  Eyes: Negative for blurred vision, double vision and photophobia.  Respiratory: Positive for shortness of breath.   Cardiovascular: Negative for chest pain.  Gastrointestinal: Negative for nausea, vomiting and diarrhea.  Neurological: Negative for headaches.    Dilation: 5.5 Effacement (%): 70 Station: -2 Exam by:: jolynn Blood pressure 127/82, pulse 82, temperature 96.8 F (36 C), temperature source Oral, resp. rate 20, height 5\' 5"  (1.651 m), weight 87.091 kg (192 lb), last menstrual period 11/23/2010, SpO2 99.00%. Exam Physical  Exam  Constitutional: She is oriented to person, place, and time. She appears well-developed and well-nourished. No distress.  HENT:  Head: Normocephalic and atraumatic.  Eyes: Pupils are equal, round, and reactive to light.  Neck: Normal range of motion.  Cardiovascular: Normal rate, regular rhythm and normal heart sounds.   Respiratory: Effort normal.  GI: Soft. She exhibits distension (gravid).  Musculoskeletal: Normal range of motion.  Neurological: She is alert and oriented to person, place, and time.  Skin: Skin is warm and dry. She is not diaphoretic. No erythema.  Psychiatric: She has a normal mood and affect. Her behavior is normal. Thought content normal.    Prenatal labs: ABO, Rh:  A+ Antibody:  neg Rubella:  Immune RPR:   NR HBsAg:    HIV:    GBS:   neg 1 hr GTT 68, 86  Assessment/Plan: 21 y.o. G2P1001 who presents at 37 weeks 5 days Admit to labor and delivery Anticipate NSVD Monitor for s/sx of pre-eclampsia Labs wnl    Ala Dach 08/28/2011, 10:10 AM

## 2011-08-28 NOTE — Progress Notes (Signed)
Subjective: Patient is comfortable. Mild increase in pain/pressure. Still not feeling contractions regularly.  Objective: BP 119/83  Pulse 87  Temp(Src) 98.2 F (36.8 C) (Oral)  Resp 20  Ht 5\' 4"  (1.626 m)  Wt 87.091 kg (192 lb)  BMI 32.96 kg/m2  SpO2 99%  LMP 11/23/2010      FHT:  FHR: 145 bpm, variability: moderate,  accelerations:  Present,  decelerations:  Absent UC:   regular, every 2-4 minutes SVE:   Dilation: 6 Effacement (%): 90 Station: -1 Exam by:: k fields, rn  Labs: Lab Results  Component Value Date   WBC 10.1 08/28/2011   HGB 12.4 08/28/2011   HCT 36.0 08/28/2011   MCV 87.4 08/28/2011   PLT 232 08/28/2011    Assessment / Plan: Augmentation of labor, progressing well  Labor: Progressing on Pitocin, will continue to increase then AROM Preeclampsia:  no s/sx Fetal Wellbeing:  Category I Pain Control:  Labor support without medications I/D:  n/a Anticipated MOD:  NSVD  Ala Dach 08/28/2011, 3:26 PM

## 2011-08-28 NOTE — ED Provider Notes (Signed)
I have seen and examined this patient and I agree with the above. In addition she is getting some IV Labetalol x 1 or 2 doses due to elevated BPs. Will hold for now on radiology eval of SOB due to Sats of 99% and clear breath sounds. Cam Hai 7:22 AM 08/28/2011

## 2011-08-28 NOTE — Progress Notes (Signed)
Subjective: Patient comfortable. Would like epidural when pain increases, but not yet.  Objective: BP 130/87  Pulse 81  Temp(Src) 98.2 F (36.8 C) (Oral)  Resp 20  Ht 5\' 4"  (1.626 m)  Wt 87.091 kg (192 lb)  BMI 32.96 kg/m2  SpO2 99%  LMP 11/23/2010      FHT:  FHR: 145 bpm, variability: moderate,  accelerations:  Present,  decelerations: None UC:   regular, every 2-4 minutes SVE:   Dilation: 6.5 Effacement (%): 90 Station: -1 Exam by:: hunt, rn  Labs: Lab Results  Component Value Date   WBC 10.1 08/28/2011   HGB 12.4 08/28/2011   HCT 36.0 08/28/2011   MCV 87.4 08/28/2011   PLT 232 08/28/2011    Assessment / Plan: Augmentation of labor, progressing well  Labor: Progressing normally, performed AROM Preeclampsia:  no s/sx Fetal Wellbeing:  Category I Pain Control:  Labor support without medications I/D:  n/a Anticipated MOD:  NSVD  Ala Dach 08/28/2011, 4:33 PM

## 2011-08-28 NOTE — Progress Notes (Signed)
Subjective: Patient comfortable. Feeling irregular contractions, some increased pressure. Denies headaches or visual changes.  Objective: BP 130/83  Pulse 89  Temp(Src) 98.1 F (36.7 C) (Oral)  Resp 20  Ht 5\' 4"  (1.626 m)  Wt 87.091 kg (192 lb)  BMI 32.96 kg/m2  SpO2 99%  LMP 11/23/2010      FHT:  FHR: 150 bpm, variability: moderate,  accelerations:  Present,  decelerations:  Absent UC:   irregular, every 2-6 minutes SVE:   Dilation: 5.5 Effacement (%): 80 Station: -2 Exam by:: Salah Nakamura/l fields, rn  Labs: Lab Results  Component Value Date   WBC 10.1 08/28/2011   HGB 12.4 08/28/2011   HCT 36.0 08/28/2011   MCV 87.4 08/28/2011   PLT 232 08/28/2011    Assessment / Plan: Labor with slow progression, will augment with pitocin  Labor: Slow progression Preeclampsia:  no signs or symptoms of toxicity Fetal Wellbeing:  Category I Pain Control:  Labor support without medications I/D:  n/a Anticipated MOD:  NSVD  Ala Dach 08/28/2011, 12:58 PM

## 2011-08-29 NOTE — Progress Notes (Signed)
Post Partum Day 1 Subjective: no complaints, up ad lib, voiding and + flatus. Declines a circumcision.  Objective: Blood pressure 103/65, pulse 72, temperature 98.4 F (36.9 C), temperature source Oral, resp. rate 18, height 5\' 4"  (1.626 m), weight 87.091 kg (192 lb), last menstrual period 11/23/2010, SpO2 96.00%, unknown if currently breastfeeding.  Physical Exam:  General: alert, cooperative, appears stated age and no distress Lochia: appropriate Uterine Fundus: firm Incision: n/a DVT Evaluation: No evidence of DVT seen on physical exam. Negative Homan's sign.   Basename 08/28/11 0625 08/26/11 1926  HGB 12.4 12.3  HCT 36.0 36.2    Assessment/Plan: Plan for discharge tomorrow, Breastfeeding, Lactation consult and Contraception Mirena   LOS: 1 day   Ala Dach 08/29/2011, 7:33 AM

## 2011-08-29 NOTE — Anesthesia Postprocedure Evaluation (Signed)
Anesthesia Post Note  Patient: Catherine Porter  Procedure(s) Performed: * No procedures listed *  Anesthesia type: Epidural  Patient location: Mother/Baby  Post pain: Pain level controlled  Post assessment: Post-op Vital signs reviewed  Last Vitals:  Filed Vitals:   08/29/11 0200  BP: 103/65  Pulse: 72  Temp: 36.9 C  Resp: 18    Post vital signs: Reviewed  Level of consciousness: awake  Complications: No apparent anesthesia complications

## 2011-08-29 NOTE — Progress Notes (Signed)
UR chart review completed.  

## 2011-08-30 MED ORDER — IBUPROFEN 600 MG PO TABS: 600.0000 mg | ORAL_TABLET | Freq: Four times a day (QID) | ORAL | Status: AC

## 2011-08-30 NOTE — Discharge Instructions (Signed)
Parto vaginal °Cuidados luego °(Vaginal Delivery, Care After) °· Cambie el apósito cada vez que vaya al baño.  °· Límpiese suavemente con un papel tisú durante su estadía en el hospital, y siempre hágalo desde adelante hacia atrás. Puede utilizar un rociador con agua caliente del grifo o el tisú descartable, o ambos.  °· Coloque el apósito y el tisú sucios en el cesto de basura del baño, en la bolsa plástica de color rojo.  °· Durante su permanencia en el hospital, guarde los coágulos. Si elimina un coágulo, por favor no tire la cadena. También, si su flujo vaginal le parece excesivo, notifíquelo al personal de enfermería.  °· La primera vez que se levante de la cama después del parto, espere la ayuda de la enfermera. No se levante sola en ningún momento si se siente débil o mareada.  °· Flexione y extienda los tobillos vigorosamente, de modo que pueda sentir que las pantorrillas se endurecen, media docena de veces por hora, cuando está en la cama y despierta.  °· No se siente sobre un pie suyo ni deje las piernas colgando del borde de la cama ni mantenga una posición que dificulte la circulación de las piernas.  °· Muchas mujeres experimentan dolores de entuerto (contracciones uterinas leves) en los dos o tres días después del parto. Pídale a la enfermera un medicamento contra el dolor si lo necesita. Algunas veces la alimentación a pecho estimula los "entuertos"; si le ocurre esto, pida la medicación ½ - ¾ de hora antes de la próxima vez que alimente al bebé.  °· Para su protección y la de su bebé, le pedimos que no traspase las puertas dobles de la entrada de la unidad de obstetricia. No lleve al bebé en brazos por el corredor. Cuando saque o entre al bebé de la habitación, por favor colóquelo en el cochecito y empújelo.  °· Las mamás pueden tener a sus bebés en la habitación todo lo que deseen.  °Document Released: 05/27/2005 Document Revised: 05/16/2011 °ExitCare® Patient Information ©2012 [employer]. °

## 2011-08-30 NOTE — Discharge Summary (Signed)
I have seen and examined this patient in conjunction with Dr Lula Olszewski.  I have taken this history and performed the exam.  I agree with the note as written above and have made corrections as needed.   Catherine Porter 08/30/2011 8:54 AM

## 2011-08-30 NOTE — Discharge Summary (Signed)
  Obstetric Discharge Summary Reason for Admission: onset of labor Prenatal Procedures: ultrasound Intrapartum Procedures: spontaneous vaginal delivery Postpartum Procedures: none Complications-Operative and Postpartum: 1st degree perineal laceration, no sutures needed.  Hemoglobin  Date Value Range Status  08/28/2011 12.4  12.0-15.0 (g/dL) Final     HCT  Date Value Range Status  08/28/2011 36.0  36.0-46.0 (%) Final    Physical Exam:  General: alert, cooperative and no distress Lochia: appropriate Uterine Fundus: firm DVT Evaluation: No evidence of DVT seen on physical exam.  Discharge Diagnoses: Term Pregnancy-delivered  Discharge Information: Date: 08/30/2011 Activity: pelvic rest Diet: routine Medications: PNV and Ibuprofen Condition: stable Instructions: refer to practice specific booklet Discharge to: home Follow-up Information    Follow up with [facility]. Schedule an appointment as soon as possible for a visit in 6 weeks.         Newborn Data: Live born female  Birth Weight: 8 lb 1.5 oz (3670 g) APGAR: 9, 9  Home with mother.  Catherine Porter 08/30/2011, 7:34 AM

## 2011-08-31 NOTE — H&P (Signed)
Agree with above note.  Catherine Porter 08/31/2011 7:29 AM

## 2011-08-31 NOTE — H&P (Signed)
Agree with above note.  Albertus Chiarelli 08/31/2011 7:29 AM   

## 2011-11-13 ENCOUNTER — Emergency Department (HOSPITAL_COMMUNITY): Payer: Self-pay

## 2011-11-13 ENCOUNTER — Encounter (HOSPITAL_COMMUNITY): Payer: Self-pay | Admitting: *Deleted

## 2011-11-13 ENCOUNTER — Emergency Department (HOSPITAL_COMMUNITY)
Admission: EM | Admit: 2011-11-13 | Discharge: 2011-11-13 | Disposition: A | Discharge status: Home / Self Care | Payer: Self-pay | Attending: Emergency Medicine | Admitting: Emergency Medicine

## 2011-11-13 DIAGNOSIS — R0602 Shortness of breath: Secondary | ICD-10-CM | POA: Insufficient documentation

## 2011-11-13 DIAGNOSIS — J069 Acute upper respiratory infection, unspecified: Secondary | ICD-10-CM | POA: Insufficient documentation

## 2011-11-13 DIAGNOSIS — R062 Wheezing: Secondary | ICD-10-CM | POA: Insufficient documentation

## 2011-11-13 MED ORDER — ALBUTEROL SULFATE (5 MG/ML) 0.5% IN NEBU
5.0000 mg | INHALATION_SOLUTION | Freq: Once | RESPIRATORY_TRACT | Status: AC
  Administered 2011-11-13: 5 mg via RESPIRATORY_TRACT
  Filled 2011-11-13: qty 1

## 2011-11-13 MED ORDER — PREDNISONE 20 MG PO TABS: 60.0000 mg | ORAL_TABLET | Freq: Every day | ORAL | Status: AC

## 2011-11-13 MED ORDER — AEROCHAMBER PLUS W/MASK MISC
Status: AC
  Administered 2011-11-13: 1
  Filled 2011-11-13: qty 1

## 2011-11-13 MED ORDER — ALBUTEROL SULFATE HFA 108 (90 BASE) MCG/ACT IN AERS
2.0000 | INHALATION_SPRAY | RESPIRATORY_TRACT | Status: DC | PRN
  Administered 2011-11-13: 2 via RESPIRATORY_TRACT
  Filled 2011-11-13: qty 6.7

## 2011-11-13 MED ORDER — PREDNISONE 20 MG PO TABS
60.0000 mg | ORAL_TABLET | Freq: Once | ORAL | Status: AC
  Administered 2011-11-13: 60 mg via ORAL
  Filled 2011-11-13: qty 3

## 2011-11-13 NOTE — Discharge Instructions (Signed)
Infeccin de las vas areas superiores en los adultos  (Upper Respiratory Infection, Adult)  La infeccin respiratoria de las vas areas superiores se conoce tambin como resfro comn. Las vas areas superiores [employer] senos nasales, la garganta, la trquea, y los bronquios. Los bronquios son las vas areas que conducen el aire a los pulmones. La mayor parte de las personas mejora luego de una [place], Biomedical engineer los sntomas pueden durar [employer]. La tos residual puede durar ms.  CAUSAS  Varios tipos de virus pueden causar la infeccin de los tejidos que cubren las vas areas superiores. Los tejidos se irritan y se inflaman y se originan secreciones. Tambin es frecuente la produccin de moco. El resfro es contagioso. El virus se disemina fcilmente a otras personas por contacto oral. Aqu se incluyen los besos, el compartir un vaso y el toser o Engineering geologist. Tambin puede diseminarse tocndose la boca o la [place] y luego tocando una superficie que luego tocan Economist.  SNTOMAS  Los sntomas se desarrollan entre uno y [place] luego de Cytogeneticist en contacto con el virus. Pueden variar de Neomia Dear persona a otra. Incluyen:  Secrecin nasal.  Estornudos  Congestin nasal.  Irritacin de los senos nasales.  Dolor de Advertising copywriter.  Prdida de la voz (laringitis).  Tos.  Fatiga.  Dolores musculares.  Prdida del apetito.  Dolor de [place].  Fiebre no muy elevada.  DIAGNSTICO  Puede diagnosticarse a s mismo la infeccin respiratoria, segn los sntomas habituales, ya que la mayor parte de las personas se resfra dos o tres veces al ao. El profesional puede confirmarlo basndose en el examen fsico. Lo ms importante es que el profesional verifique que los sntomas no se deben a otra enfermedad como anginas, sinusitis, neumona, asma o epiglotitis. Para diagnosticar el resfrio comn, no es necesario que haga anlisis de [place], pruebas en la garganta o radiografas, pero en algunos casos  puede ser de utilidad para excluir otros problemas ms graves. El mdico decidir si necesita otras pruebas.  RIESGOS Y COMPLICACIONES  Tendr mayor riesgo de sufrir un resfro grave si consume cigarrillos, sufre una enfermedad cardaca (como insuficiencia cardaca) o pulmonar crnica (como asma) o si tiene un debilitamiento del sistema inmunolgico. Las personas muy jvenes o muy mayores tienen riesgo de sufrir infecciones ms graves. La sinusitis bacteriana, las infecciones del odo medio y la neumona bacteriana pueden complicar el resfro comn. El resfro puede exacerbar el asma y la enfermedad pulmonar obstructiva crnica. En algunos casos estas complicaciones requieren la atencin en un servicio de emergencias y pueden poner en peligro la vida.  PREVENCIN  La mejor manera de protegerse para no contraer un resfro es Pharmacologist una buena higiene. Evite el contacto bucal o de las manos con personas con sntomas de resfro. Si se produce el contacto, lvese las manos con frecuencia. No hay pruebas firmes que indiquen que la vitamina C, la vitamina E, la equincea o la actividad fsica reduzcan las posibilidades de tener una infeccin. Sin embargo, siempre se recomienda Insurance account manager y Winferd Humphrey buena nutricin.  TRATAMIENTO  El tratamiento est dirigido a Consulting civil engineer sntomas. Esta enfermedad no tiene [place]. Los antibiticos no son eficaces, ya que esta infeccin la causa un virus y no una bacteria. El tratamiento incluye:  Aumente la ingesta de lquidos. Consumo de bebidas deportivas, que proporcionan electrolitos,azcares e hidratacin.  Inhale vapor caliente (de un vaporizador o de la ducha).  Tomar sopa de pollo u otros lquidos claros, y [employer] buena nutricin.  Descanse lo suficiente.  Haga grgaras o coma pastillas para [employer].  Control de la fiebre con ibuprofeno o acetaminofen, segn las indicaciones del mdico.  Aumento del uso del inhalador, si sufre asma.  Las  pastillas y los geles de zinc durante las primeras 24 horas de iniciado el resfro comn, pueden disminuir la duracin y Paramedic la gravedad de los sntomas. Los medicamentos para Chief Technology Officer pueden disminuir la fiebre, Paramedic los dolores musculares y Chief Technology Officer de Advertising copywriter. Se dispone de una gran variedad de medicamentos de venta libre para tratar la congestin y la secrecin nasal. El profesional podr recomendarle inhalantes para los otros sntomas.  INSTRUCCIONES PARA EL CUIDADO DOMICILIARIO  Utilice los medicamentos de venta libre o de prescripcin para Chief Technology Officer, el malestar o la [place], segn se lo indique el profesional que lo asiste.  Utilice un vaporizador caliente o inhale vapor, haciendo salir agua de la ducha para aumentar la humedad [place]. Esto mantendr las secreciones hmedas y Community education officer ms fcil respirar.  Beba gran cantidad de lquido para mantener la orina de tono claro o color amarillo plido.  Descanse todo lo que pueda.  Regrese a su trabajo cuando la temperatura se haya normalizado, o cuando el profesional que lo asiste se lo indique. Quizs sea necesario que permanezca en su casa durante un tiempo ms prolongado para Buyer, retail a Economist. Tambin puede utilizar un barbijo y ser cuidadoso con el lavado de manos para evitar la diseminacin del virus.  SOLICITE ATENCIN MDICA SI:  Luego de los primeros das siente que empeora en vez de [place].  Necesita que Occupational psychologist brinde ms informacin relacionada con los medicamentos para [employer] sntomas.  Siente escalofros, le falta el aire o escupe moco de color marrn o rojo. Estos pueden ser sntomas de neumona.  Tiene una secrecin nasal de color amarillo o marrn, o siente dolor en el rostro, especialmente cuando se inclina hacia adelante. Estos pueden ser sntomas de sinusitis.  Tiene fiebre, siente el cuello hinchado, tiene dolor al tragar u observa manchas blancas en el fondo de la garganta. Estos pueden  ser sntomas de angina por estreptococo.  Romona Curls ATENCIN MDICA DE INMEDIATO SI:  Lance Muss.  Comienza a sentir Herbalist de cabeza intenso o persistente, dolor de odos, en el seno nasal o en el pecho.  Tiene tos y esta se prolonga demasiado, tose y escupe sangre, la mucosidad habitual se modifica (si tiene una enfermedad pulmonar crnica) o respira con dificultad.  Siente rigidez en el cuello o dolor de cabeza intenso.

## 2011-11-13 NOTE — ED Notes (Signed)
Pt c/o difficulty breathing for 2 days and coughing non-productive

## 2011-11-13 NOTE — ED Provider Notes (Signed)
History     CSN: [id number]  Arrival date & time 11/13/11  0000   First MD Initiated Contact with Patient 11/13/11 [phone]      Chief Complaint  Patient presents with  . Shortness of Breath    (Consider location/radiation/quality/duration/timing/severity/associated sxs/prior treatment) HPI Hx provided by patient. Having symptoms for the last few days with cough and difficulty breathing. Some sore throat from coughing. No fevers or chills. No nausea vomiting. No ear pain. No rashes. No sick contacts at home. No runny nose or eye irritation or history of allergies. She denies history of asthma. Symptoms moderate in severity. No aggravating or alleviating factors. No chest pain. No recent travel or antibiotics. Past Medical History  Diagnosis Date  . No pertinent past medical history   . Hypertension     Past Surgical History  Procedure Date  . Cholecystectomy   . Tonsillectomy   . Nasal septum surgery     Family History  Problem Relation Age of Onset  . Anesthesia problems Neg Hx     History  Substance Use Topics  . Smoking status: Former Games developer  . Smokeless tobacco: Never Used  . Alcohol Use: No    OB History    Grav Para Term Preterm Abortions TAB SAB Ect Mult Living   2 2 2  0 0 0 0 0 0 2      Review of Systems  Constitutional: Negative for fever and chills.  HENT: Negative for neck pain and neck stiffness.   Eyes: Negative for pain.  Respiratory: Positive for cough, shortness of breath and wheezing.   Cardiovascular: Negative for chest pain.  Gastrointestinal: Negative for abdominal pain.  Genitourinary: Negative for dysuria.  Musculoskeletal: Negative for back pain.  Skin: Negative for rash.  Neurological: Negative for headaches.  All other systems reviewed and are negative.    Allergies  Review of patient's allergies indicates no known allergies.  Home Medications   Current Outpatient Rx  Name Route Sig Dispense Refill  . LEVONORGESTREL 20 MCG/24HR  IU IUD Intrauterine 1 each by Intrauterine route once.    . ADULT MULTIVITAMIN W/MINERALS CH Oral Take 1 tablet by mouth daily.      BP 133/90  Pulse 89  Temp(Src) 98.4 F (36.9 C) (Oral)  Resp 18  SpO2 96%  LMP 10/23/2011  Physical Exam  Constitutional: She is oriented to person, place, and time. She appears well-developed and well-nourished.  HENT:  Head: Normocephalic and atraumatic.  Right Ear: External ear normal.  Left Ear: External ear normal.  Mouth/Throat: Oropharynx is clear and moist. No oropharyngeal exudate.  Eyes: Conjunctivae and EOM are normal. Pupils are equal, round, and reactive to light.  Neck: Trachea normal. Neck supple. No thyromegaly present.  Cardiovascular: Normal rate, regular rhythm, S1 normal, S2 normal and normal pulses.     No systolic murmur is present   No diastolic murmur is present  Pulses:      Radial pulses are 2+ on the right side, and 2+ on the left side.  Pulmonary/Chest: Effort normal. She has no rhonchi.       Bilateral expiratory wheezes present, no tachypnea or retractions. Mildly decreased air movement with prolonged expiration  Abdominal: Soft. Normal appearance and bowel sounds are normal. There is no tenderness. There is no CVA tenderness and negative Murphy's sign.  Musculoskeletal:       BLE:s Calves nontender, no cords or erythema, negative Homans sign  Neurological: She is alert and oriented to person, place, and  time. She has normal strength. No cranial nerve deficit or sensory deficit. GCS eye subscore is 4. GCS verbal subscore is 5. GCS motor subscore is 6.  Skin: Skin is warm and dry. No rash noted. She is not diaphoretic.  Psychiatric: Her speech is normal.       Cooperative and appropriate    ED Course  Procedures (including critical care time)  Labs Reviewed - No data to display Dg Chest 2 View  11/13/2011  *RADIOLOGY REPORT*  Clinical Data: Shortness of breath and wheezing.  CHEST - 2 VIEW  Comparison: 05/17/2011.   Findings: The cardiac silhouette, mediastinal and hilar contours are within normal limits and stable. The lungs are clear. Minimal peribronchial thickening.  No pleural effusions.  The bony thorax is intact.  IMPRESSION: Mild peribronchial thickening may suggest bronchitis.  No focal infiltrates.  Original Report Authenticated By: P. Loralie Champagne, M.D.     Albuterol treatment provided. Prednisone 60 mg.  Recheck 2:20 AM after medications- breathing feels much better and wheezes resolved. Chest x-ray reviewed as above. Room air pulse ox 96%. No hypoxia MDM   Nurse's notes reviewed. Vital signs reviewed.  Albuterol breathing treatment and prednisone as above. Chest x-ray reviewed as above.  Presentation suggests URI with wheezes. Plan discharge home with albuterol inhaler provided and prescription for five-day course prednisone. Reliable historian verbalizes understanding return precautions for worsening condition. Otherwise followup with primary care.         Sunnie Nielsen, MD 11/13/11 [phone]

## 2012-01-26 ENCOUNTER — Emergency Department (HOSPITAL_COMMUNITY)
Admission: EM | Admit: 2012-01-26 | Discharge: 2012-01-26 | Disposition: A | Discharge status: Home / Self Care | Payer: Self-pay | Attending: Emergency Medicine | Admitting: Emergency Medicine

## 2012-01-26 ENCOUNTER — Encounter (HOSPITAL_COMMUNITY): Payer: Self-pay | Admitting: *Deleted

## 2012-01-26 DIAGNOSIS — Z87891 Personal history of nicotine dependence: Secondary | ICD-10-CM | POA: Insufficient documentation

## 2012-01-26 DIAGNOSIS — Z8489 Family history of other specified conditions: Secondary | ICD-10-CM | POA: Insufficient documentation

## 2012-01-26 DIAGNOSIS — J45901 Unspecified asthma with (acute) exacerbation: Secondary | ICD-10-CM | POA: Insufficient documentation

## 2012-01-26 DIAGNOSIS — I10 Essential (primary) hypertension: Secondary | ICD-10-CM | POA: Insufficient documentation

## 2012-01-26 MED ORDER — ALBUTEROL SULFATE HFA 108 (90 BASE) MCG/ACT IN AERS
2.0000 | INHALATION_SPRAY | RESPIRATORY_TRACT | Status: DC | PRN
  Administered 2012-01-26: 2 via RESPIRATORY_TRACT
  Filled 2012-01-26: qty 6.7

## 2012-01-26 MED ORDER — IPRATROPIUM BROMIDE 0.02 % IN SOLN
RESPIRATORY_TRACT | Status: AC
  Filled 2012-01-26: qty 2.5

## 2012-01-26 MED ORDER — ALBUTEROL SULFATE (5 MG/ML) 0.5% IN NEBU
INHALATION_SOLUTION | RESPIRATORY_TRACT | Status: AC
  Filled 2012-01-26: qty 2

## 2012-01-26 NOTE — ED Provider Notes (Signed)
History     CSN: [id number]  Arrival date & time 01/26/12  0120   First MD Initiated Contact with Patient 01/26/12 0124      Chief Complaint  Patient presents with  . Shortness of Breath    (Consider location/radiation/quality/duration/timing/severity/associated sxs/prior treatment) HPI Comments: Patient states tonight, about 5:30 she noticed some shortness of breath and wheezing.  She tried using her inhaler, but found it was empty this resolved spontaneously, but about 9:30 she again developed shortness of breath, which progressed throughout the night until she called EMS who administered albuterol, Atrovent, for total resolution of her symptoms.  She is, concerned, because she's never had asthma before she was seen 11/13/2011 in this emergency department, and given the inhaler in conjunction with a URI and she has been using it intermittently since that time.  For shortness of breath.  She does not have a primary care physician.  The history is provided by the patient.    Past Medical History  Diagnosis Date  . No pertinent past medical history   . Hypertension     Past Surgical History  Procedure Date  . Cholecystectomy   . Tonsillectomy   . Nasal septum surgery     Family History  Problem Relation Age of Onset  . Anesthesia problems Neg Hx     History  Substance Use Topics  . Smoking status: Former Games developer  . Smokeless tobacco: Never Used  . Alcohol Use: No    OB History    Grav Para Term Preterm Abortions TAB SAB Ect Mult Living   2 2 2  0 0 0 0 0 0 2      Review of Systems  Constitutional: Negative for fever and chills.  HENT: Negative for rhinorrhea.   Respiratory: Positive for shortness of breath and wheezing. Negative for cough.   Gastrointestinal: Negative for nausea and vomiting.  Skin: Negative for rash.    Allergies  Review of patient's allergies indicates no known allergies.  Home Medications   Current Outpatient Rx  Name Route Sig  Dispense Refill  . ALBUTEROL SULFATE HFA 108 (90 BASE) MCG/ACT IN AERS Inhalation Inhale 2 puffs into the lungs every 6 (six) hours as needed. For shortness of breath    . IBUPROFEN 200 MG PO TABS Oral Take 400 mg by mouth every 6 (six) hours as needed. For pain    . LEVONORGESTREL 20 MCG/24HR IU IUD Intrauterine 1 each by Intrauterine route once.    . ADULT MULTIVITAMIN W/MINERALS CH Oral Take 1 tablet by mouth daily.      BP 128/99  Pulse 77  Temp 98.5 F (36.9 C) (Oral)  Resp 15  SpO2 100%  Physical Exam  Constitutional: She appears well-developed and well-nourished.  HENT:  Head: Normocephalic and atraumatic.  Neck: Normal range of motion.  Cardiovascular: Normal rate.   Pulmonary/Chest: Effort normal.       Patient was examined after she had received albuterol Atrovent nebulized treatment by EMS, and there is no active wheezing she is slightly tachycardic.  From her baseline    ED Course  Procedures (including critical care time)  Labs Reviewed - No data to display No results found.   1. Asthma attack       MDM   Reexamination approximately 30 minutes after completion of her nebulized treatment at that time.  I will decide whether she needs steroids, and a chest x-ray  Patient ambulated in the hallway.  She maintained her O2 sat at 99,  percent, she's no longer tachycardic.  She feels, like she's getting a full breath.  Will discharge patient home with an albuterol inhaler        Arman Filter, NP 01/26/12 0254  Arman Filter, NP 01/26/12 [phone]

## 2012-01-26 NOTE — ED Provider Notes (Signed)
Medical screening examination/treatment/procedure(s) were performed by non-physician practitioner and as supervising physician I was immediately available for consultation/collaboration.  Olivia Mackie, MD 01/26/12 [phone]

## 2012-05-03 ENCOUNTER — Encounter (HOSPITAL_COMMUNITY): Payer: Self-pay | Admitting: *Deleted

## 2012-05-03 ENCOUNTER — Emergency Department (HOSPITAL_COMMUNITY)
Admission: EM | Admit: 2012-05-03 | Discharge: 2012-05-03 | Disposition: A | Discharge status: Home / Self Care | Payer: Self-pay | Attending: Emergency Medicine | Admitting: Emergency Medicine

## 2012-05-03 DIAGNOSIS — R0602 Shortness of breath: Secondary | ICD-10-CM | POA: Insufficient documentation

## 2012-05-03 DIAGNOSIS — R0789 Other chest pain: Secondary | ICD-10-CM | POA: Insufficient documentation

## 2012-05-03 DIAGNOSIS — R062 Wheezing: Secondary | ICD-10-CM | POA: Insufficient documentation

## 2012-05-03 DIAGNOSIS — R05 Cough: Secondary | ICD-10-CM | POA: Insufficient documentation

## 2012-05-03 DIAGNOSIS — J45909 Unspecified asthma, uncomplicated: Secondary | ICD-10-CM | POA: Insufficient documentation

## 2012-05-03 DIAGNOSIS — Z87891 Personal history of nicotine dependence: Secondary | ICD-10-CM | POA: Insufficient documentation

## 2012-05-03 DIAGNOSIS — R059 Cough, unspecified: Secondary | ICD-10-CM | POA: Insufficient documentation

## 2012-05-03 DIAGNOSIS — I10 Essential (primary) hypertension: Secondary | ICD-10-CM | POA: Insufficient documentation

## 2012-05-03 HISTORY — DX: Unspecified asthma, uncomplicated: J45.909

## 2012-05-03 MED ORDER — PREDNISONE (PAK) 10 MG PO TABS: 10.0000 mg | ORAL_TABLET | Freq: Every day | ORAL | Status: DC

## 2012-05-03 MED ORDER — PREDNISONE 20 MG PO TABS
60.0000 mg | ORAL_TABLET | Freq: Once | ORAL | Status: AC
  Administered 2012-05-03: 60 mg via ORAL
  Filled 2012-05-03: qty 3

## 2012-05-03 MED ORDER — ALBUTEROL SULFATE (5 MG/ML) 0.5% IN NEBU
5.0000 mg | INHALATION_SOLUTION | Freq: Once | RESPIRATORY_TRACT | Status: AC
  Administered 2012-05-03: 5 mg via RESPIRATORY_TRACT
  Filled 2012-05-03: qty 1

## 2012-05-03 MED ORDER — ALBUTEROL SULFATE HFA 108 (90 BASE) MCG/ACT IN AERS
2.0000 | INHALATION_SPRAY | RESPIRATORY_TRACT | Status: DC | PRN
  Administered 2012-05-03: 2 via RESPIRATORY_TRACT
  Filled 2012-05-03: qty 6.7

## 2012-05-03 NOTE — ED Provider Notes (Signed)
Medical screening examination/treatment/procedure(s) were performed by non-physician practitioner and as supervising physician I was immediately available for consultation/collaboration.   Jeydi Klingel H Juanantonio Stolar, MD 05/03/12 1524 

## 2012-05-03 NOTE — ED Provider Notes (Signed)
History     CSN: [id number]  Arrival date & time 05/03/12  1018   First MD Initiated Contact with Patient 05/03/12 1044      Chief Complaint  Patient presents with  . Shortness of Breath  . Asthma    (Consider location/radiation/quality/duration/timing/severity/associated sxs/prior treatment) HPI Comments: Pt with hx asthma reports typical flare of her asthma.  States she has been coughing (nonproductive), wheezing, SOB, with associated chest tightness since last night.  Symptoms began gradually, worsening this morning.  Pt is out of her albuterol inhaler and has been unable to follow up with a primary care office (was refused by "[address]" where she says she was referred last visit.  Denies fevers, sore throat, other upper respiratory symptoms.    Patient is a 21 y.o. female presenting with shortness of breath and asthma. The history is provided by the patient.  Shortness of Breath  Associated symptoms include cough, shortness of breath and wheezing. Pertinent negatives include no fever, no rhinorrhea and no sore throat. Her past medical history is significant for asthma.  Asthma Associated symptoms include coughing. Pertinent negatives include no abdominal pain, chills, congestion, fever, nausea, sore throat or vomiting.    Past Medical History  Diagnosis Date  . No pertinent past medical history   . Hypertension   . Asthma     Past Surgical History  Procedure Date  . Cholecystectomy   . Tonsillectomy   . Nasal septum surgery     Family History  Problem Relation Age of Onset  . Anesthesia problems Neg Hx     History  Substance Use Topics  . Smoking status: Former Games developer  . Smokeless tobacco: Never Used  . Alcohol Use: No    OB History    Grav Para Term Preterm Abortions TAB SAB Ect Mult Living   2 2 2  0 0 0 0 0 0 2      Review of Systems  Constitutional: Negative for fever and chills.  HENT: Negative for ear pain, congestion, sore throat, rhinorrhea  and sinus pressure.   Respiratory: Positive for cough, chest tightness, shortness of breath and wheezing.   Gastrointestinal: Negative for nausea, vomiting, abdominal pain and diarrhea.    Allergies  Fish allergy  Home Medications   Current Outpatient Rx  Name  Route  Sig  Dispense  Refill  . ALBUTEROL SULFATE HFA 108 (90 BASE) MCG/ACT IN AERS   Inhalation   Inhale 2 puffs into the lungs every 6 (six) hours as needed. For shortness of breath         . IBUPROFEN 200 MG PO TABS   Oral   Take 400 mg by mouth every 6 (six) hours as needed. For pain         . LEVONORGESTREL 20 MCG/24HR IU IUD   Intrauterine   1 each by Intrauterine route once.         . ADULT MULTIVITAMIN W/MINERALS CH   Oral   Take 1 tablet by mouth daily.           BP 126/71  Pulse 65  Temp 97.8 F (36.6 C) (Oral)  Resp 22  SpO2 99%  Physical Exam  Nursing note and vitals reviewed. Constitutional: She appears well-developed and well-nourished. No distress.  HENT:  Head: Normocephalic and atraumatic.  Neck: Neck supple.  Cardiovascular: Normal rate and regular rhythm.   Pulmonary/Chest: Breath sounds normal. Tachypnea noted. No respiratory distress. She has no decreased breath sounds. She has no wheezes.  She has no rhonchi. She has no rales.       Pt examined following nebulizer treatment.  Her lungs are now clear though she is tachypneic.    Abdominal: Soft. She exhibits no distension. There is no tenderness. There is no rebound and no guarding.  Neurological: She is alert.  Skin: She is not diaphoretic.    ED Course  Procedures (including critical care time)  Labs Reviewed - No data to display No results found.  11:45 AM Pt reports complete relief.  Lungs CTAB, moving air well.  Tachypnea has resolved.  Pt declines further neb treatments.    1. Asthma     MDM  Pt with hx asthma, out of her albuterol inhaler and without PCP.  Gradual worsening since yesterday.  Improved dramatically  with one nebulizer treatment.  Doubt pneumonia or other acute lung pathology, doubt PE.  Given prednisone here.  Pt d/c home with inhaler rx for steroid taper, PCP resources for follow up.   Discussed diagnosis and plan with patient.  Pt given return precautions.  Pt verbalizes understanding and agrees with plan.          [place], [place] 05/03/12 1306

## 2012-05-03 NOTE — ED Notes (Signed)
Pt reports having asthma, feeling sob, audible wheezing noted at triage. Pt has recently run out of her inhaler. spo2 99% at triage, able to speak in full sentences.

## 2012-06-10 HISTORY — PX: LAPAROSCOPIC CHOLECYSTECTOMY: SUR755

## 2012-10-06 ENCOUNTER — Emergency Department (INDEPENDENT_AMBULATORY_CARE_PROVIDER_SITE_OTHER)
Admission: EM | Admit: 2012-10-06 | Discharge: 2012-10-06 | Disposition: A | Discharge status: Home / Self Care | Payer: Self-pay | Source: Home / Self Care | Attending: Emergency Medicine | Admitting: Emergency Medicine

## 2012-10-06 ENCOUNTER — Encounter (HOSPITAL_COMMUNITY): Payer: Self-pay | Admitting: Emergency Medicine

## 2012-10-06 DIAGNOSIS — R0789 Other chest pain: Secondary | ICD-10-CM

## 2012-10-06 DIAGNOSIS — J45909 Unspecified asthma, uncomplicated: Secondary | ICD-10-CM

## 2012-10-06 DIAGNOSIS — R071 Chest pain on breathing: Secondary | ICD-10-CM

## 2012-10-06 MED ORDER — CETIRIZINE HCL 10 MG PO CAPS: 10.0000 mg | ORAL_CAPSULE | Freq: Every day | ORAL | Status: DC

## 2012-10-06 MED ORDER — DICLOFENAC SODIUM 75 MG PO TBEC: 75.0000 mg | DELAYED_RELEASE_TABLET | Freq: Two times a day (BID) | ORAL | Status: DC

## 2012-10-06 MED ORDER — ALBUTEROL SULFATE HFA 108 (90 BASE) MCG/ACT IN AERS: 1.0000 | INHALATION_SPRAY | Freq: Four times a day (QID) | RESPIRATORY_TRACT | Status: DC | PRN

## 2012-10-06 NOTE — ED Provider Notes (Addendum)
History     CSN: [id number]  Arrival date & time 10/06/12  1611   First MD Initiated Contact with Patient 10/06/12 1726      Chief Complaint  Patient presents with  . Medication Refill    (Consider location/radiation/quality/duration/timing/severity/associated sxs/prior treatment) HPI Comments: Patient presents urgent care requesting refills for her will asthma medicines, today she had a anxiety attack because he couldn't find her inhaler. Sure of breath and wheezing after a coughing spell. No she is sore on the right upper portion of her right chest. It hurts when she takes a deep breath. And when she touches the area. Patient denies any fevers for the symptoms   Past Medical History  Diagnosis Date  . No pertinent past medical history   . Hypertension   . Asthma     Past Surgical History  Procedure Laterality Date  . Cholecystectomy    . Tonsillectomy    . Nasal septum surgery      Family History  Problem Relation Age of Onset  . Anesthesia problems Neg Hx     History  Substance Use Topics  . Smoking status: Former Games developer  . Smokeless tobacco: Never Used  . Alcohol Use: No    OB History   Grav Para Term Preterm Abortions TAB SAB Ect Mult Living   2 2 2  0 0 0 0 0 0 2      Review of Systems  Constitutional: Negative for fever, activity change and appetite change.  Respiratory: Positive for cough, chest tightness, shortness of breath and wheezing.   Cardiovascular: Negative for chest pain, palpitations and leg swelling.  Skin: Negative for color change, pallor and rash.    Allergies  Fish allergy  Home Medications   Current Outpatient Rx  Name  Route  Sig  Dispense  Refill  . albuterol (PROVENTIL HFA;VENTOLIN HFA) 108 (90 BASE) MCG/ACT inhaler   Inhalation   Inhale 2 puffs into the lungs every 6 (six) hours as needed. For shortness of breath         . levonorgestrel (MIRENA) 20 MCG/24HR IUD   Intrauterine   1 each by Intrauterine route once.           Marland Kitchen albuterol (PROVENTIL HFA;VENTOLIN HFA) 108 (90 BASE) MCG/ACT inhaler   Inhalation   Inhale 1-2 puffs into the lungs every 6 (six) hours as needed for wheezing.   1 Inhaler   2   . Cetirizine HCl (ZYRTEC ALLERGY) 10 MG CAPS   Oral   Take 1 capsule (10 mg total) by mouth daily.   30 capsule   0   . diclofenac (VOLTAREN) 75 MG EC tablet   Oral   Take 1 tablet (75 mg total) by mouth 2 (two) times daily.   7 tablet   0   . Multiple Vitamin (MULITIVITAMIN WITH MINERALS) TABS   Oral   Take 1 tablet by mouth daily.         . predniSONE (STERAPRED UNI-PAK) 10 MG tablet   Oral   Take 1 tablet (10 mg total) by mouth daily. Day 1: take 6 tabs.  Day 2: 5 tabs  Day 3: 4 tabs  Day 4: 3 tabs  Day 5: 2 tabs  Day 6: 1 tab   21 tablet   0     BP 114/71  Pulse 56  Temp(Src) 98 F (36.7 C) (Oral)  Resp 16  SpO2 100%  Breastfeeding? No  Physical Exam  Nursing note and vitals reviewed.  Constitutional: Vital signs are normal. She appears well-developed and well-nourished.  Non-toxic appearance. She does not have a sickly appearance. She does not appear ill. No distress.  HENT:  Head: Normocephalic.  Right Ear: Tympanic membrane normal.  Left Ear: Tympanic membrane normal.  Nose: Nose normal. Right sinus exhibits no frontal sinus tenderness. Left sinus exhibits no frontal sinus tenderness.  Mouth/Throat: Uvula is midline and oropharynx is clear and moist. No oropharyngeal exudate, posterior oropharyngeal edema, posterior oropharyngeal erythema or tonsillar abscesses.  Eyes: Conjunctivae are normal.  Neck: Neck supple.  Pulmonary/Chest: Breath sounds normal. No respiratory distress. She has no decreased breath sounds. She has no wheezes. She has no rhonchi. She has no rales. She exhibits no mass, no tenderness, no laceration, no crepitus, no edema, no deformity, no swelling and no retraction.    Neurological: She is alert.  Skin: No rash noted. No erythema.    ED Course   Procedures (including critical care time)  Labs Reviewed - No data to display No results found.   1. Asthma   2. Chest wall pain       MDM  Prescription for albuterol. Patient is in no respiratory distress looks comfortable exam was consistent with musculoskeletal chest wall pain. Have been prescribed a course of diclofenac use for 7 days.        Jimmie Molly, MD 10/06/12 1803  Jimmie Molly, MD 10/06/12 2031

## 2012-10-06 NOTE — ED Notes (Signed)
Pt is here primarily to get her ventolin med refilled  States she had an anxiety attack this pm around 1400 b/c she ran out of her Ventolin EMS came to her house but was not admitted.  Currently she has a sharp pain on the right side of chest that is constant and hurts to apply pressure and to take deep breaths Denies at the moment any SOB, wheezing, f/vn/d States she is more calm now than earlier in the pm.   She is alert and oriented w/no signs of acute distress.

## 2012-12-25 ENCOUNTER — Encounter (HOSPITAL_COMMUNITY): Payer: Self-pay | Admitting: *Deleted

## 2012-12-25 ENCOUNTER — Emergency Department (HOSPITAL_COMMUNITY)
Admission: EM | Admit: 2012-12-25 | Discharge: 2012-12-25 | Disposition: A | Discharge status: Home / Self Care | Payer: Medicaid Other | Attending: Emergency Medicine | Admitting: Emergency Medicine

## 2012-12-25 DIAGNOSIS — Z79899 Other long term (current) drug therapy: Secondary | ICD-10-CM | POA: Insufficient documentation

## 2012-12-25 DIAGNOSIS — Z87891 Personal history of nicotine dependence: Secondary | ICD-10-CM | POA: Insufficient documentation

## 2012-12-25 DIAGNOSIS — I10 Essential (primary) hypertension: Secondary | ICD-10-CM | POA: Insufficient documentation

## 2012-12-25 DIAGNOSIS — Z760 Encounter for issue of repeat prescription: Secondary | ICD-10-CM | POA: Insufficient documentation

## 2012-12-25 DIAGNOSIS — R05 Cough: Secondary | ICD-10-CM | POA: Insufficient documentation

## 2012-12-25 DIAGNOSIS — J45901 Unspecified asthma with (acute) exacerbation: Secondary | ICD-10-CM | POA: Insufficient documentation

## 2012-12-25 DIAGNOSIS — R0602 Shortness of breath: Secondary | ICD-10-CM | POA: Insufficient documentation

## 2012-12-25 DIAGNOSIS — R059 Cough, unspecified: Secondary | ICD-10-CM | POA: Insufficient documentation

## 2012-12-25 MED ORDER — ALBUTEROL SULFATE HFA 108 (90 BASE) MCG/ACT IN AERS: 1.0000 | INHALATION_SPRAY | Freq: Four times a day (QID) | RESPIRATORY_TRACT | Status: DC | PRN

## 2012-12-25 MED ORDER — ALBUTEROL SULFATE HFA 108 (90 BASE) MCG/ACT IN AERS
2.0000 | INHALATION_SPRAY | Freq: Once | RESPIRATORY_TRACT | Status: AC
  Administered 2012-12-25: 2 via RESPIRATORY_TRACT
  Filled 2012-12-25: qty 6.7

## 2012-12-25 MED ORDER — ALBUTEROL SULFATE (5 MG/ML) 0.5% IN NEBU
5.0000 mg | INHALATION_SOLUTION | Freq: Once | RESPIRATORY_TRACT | Status: AC
  Administered 2012-12-25: 5 mg via RESPIRATORY_TRACT
  Filled 2012-12-25: qty 1

## 2012-12-25 NOTE — ED Provider Notes (Signed)
This chart was scribed for Iona Coach, a non-physician practitioner working with Doug Sou, MD by Lewanda Rife, ED Scribe. This patient was seen in room TR09C/TR09C and the patient's care was started at 2215.     History    CSN: [id number] Arrival date & time 12/25/12  2125  First MD Initiated Contact with Patient 12/25/12 2207     Chief Complaint  Patient presents with  . Wheezing  . Cough   (Consider location/radiation/quality/duration/timing/severity/associated sxs/prior Treatment) The history is provided by the patient.   HPI Comments: Catherine Porter is a 22 y.o. female who presents to the Emergency Department complaining of exacerbation of chronic asthma onset PTA. Reports moderate non-productive cough, and shortness of breath. Reports running out of inhaler. Denies associated rhinorrhea, fever, sore throat, and recent illness. Reports using her albuterol inhaler 1 time every other day. Reports symptoms are aggravated by pollen and alleviated by albuterol inhaler. Denies significant PMH.   Past Medical History  Diagnosis Date  . No pertinent past medical history   . Hypertension   . Asthma    Past Surgical History  Procedure Laterality Date  . Cholecystectomy    . Tonsillectomy    . Nasal septum surgery     Family History  Problem Relation Age of Onset  . Anesthesia problems Neg Hx    History  Substance Use Topics  . Smoking status: Former Games developer  . Smokeless tobacco: Never Used  . Alcohol Use: No   OB History   Grav Para Term Preterm Abortions TAB SAB Ect Mult Living   2 2 2  0 0 0 0 0 0 2     Review of Systems  Constitutional: Negative for fever.  HENT: Negative for sore throat and rhinorrhea.   Respiratory: Positive for cough and shortness of breath.   Allergic/Immunologic: Positive for environmental allergies.  Psychiatric/Behavioral: Negative for confusion.  All other systems reviewed and are negative.   A complete 10 system  review of systems was obtained and all systems are negative except as noted in the HPI and PMH.     Allergies  Fish allergy  Home Medications   Current Outpatient Rx  Name  Route  Sig  Dispense  Refill  . albuterol (PROVENTIL HFA;VENTOLIN HFA) 108 (90 BASE) MCG/ACT inhaler   Inhalation   Inhale 2 puffs into the lungs every 6 (six) hours as needed. For shortness of breath         . levonorgestrel (MIRENA) 20 MCG/24HR IUD   Intrauterine   1 each by Intrauterine route once.         . Multiple Vitamin (MULITIVITAMIN WITH MINERALS) TABS   Oral   Take 1 tablet by mouth daily.         Marland Kitchen EXPIRED: Cetirizine HCl (ZYRTEC ALLERGY) 10 MG CAPS   Oral   Take 1 capsule (10 mg total) by mouth daily.   30 capsule   0    BP 133/66  Pulse 71  Temp(Src) 98 F (36.7 C) (Oral)  Resp 16  SpO2 98% Physical Exam  Nursing note and vitals reviewed. Constitutional: She is oriented to person, place, and time. She appears well-developed and well-nourished. No distress.  HENT:  Head: Normocephalic and atraumatic.  Eyes: EOM are normal.  Neck: Neck supple. No tracheal deviation present.  Cardiovascular: Normal rate, regular rhythm and intact distal pulses.   No murmur heard. Pulmonary/Chest: Effort normal and breath sounds normal. No respiratory distress. She has no wheezes. She has  no rales.  Musculoskeletal: Normal range of motion.  Neurological: She is alert and oriented to person, place, and time.  Skin: Skin is warm and dry.  Psychiatric: She has a normal mood and affect. Her behavior is normal.    ED Course  Procedures (including critical care time) Medications  albuterol (PROVENTIL) (5 MG/ML) 0.5% nebulizer solution 5 mg (5 mg Nebulization Given 12/25/12 2142)  2142 given Albuterol inhaler with moderate relief of symptoms  Labs Reviewed - No data to display No results found.   1. Asthma attack     MDM  Pt with asthma attack, ran out of inhaler. Upon presentation in ER,  given a neb treatment which resolved all her symptoms. On my exam, which was after the neb treatment, pt has no complaints. Lungs are clear. VS normal. Given inhaler in ED. Home with follow up as needed.   Filed Vitals:   12/25/12 2130  BP: 133/66  Pulse: 71  Temp: 98 F (36.7 C)  TempSrc: Oral  Resp: 16  SpO2: 98%   I personally performed the services described in this documentation, which was scribed in my presence. The recorded information has been reviewed and is accurate.   Lottie Mussel, PA-C 12/26/12 0019

## 2012-12-25 NOTE — ED Notes (Addendum)
Here for wheezing, describes sx as mild. Out of albuterol. Uses regularly, more at night. Went to use albuterol after being at the park with her kids and HFA was on "0". Also mentions dry cough, recent cold sx "runny nose".has been using "tylenol cold", (denies: fever, nvd, cough production). LS wheezing. Alert, NAD, calm, interactive, speaking in clear complete sentences. Last seen for asthma attack at [facility] in April. Had prednisone and albuterol at that time. Denies recent CXR.

## 2012-12-26 NOTE — ED Provider Notes (Signed)
Medical screening examination/treatment/procedure(s) were performed by non-physician practitioner and as supervising physician I was immediately available for consultation/collaboration.  Fonnie Crookshanks, MD 12/26/12 0224 

## 2013-03-17 ENCOUNTER — Emergency Department (HOSPITAL_COMMUNITY)
Admission: EM | Admit: 2013-03-17 | Discharge: 2013-03-17 | Disposition: A | Discharge status: Home / Self Care | Payer: Medicaid Other | Attending: Emergency Medicine | Admitting: Emergency Medicine

## 2013-03-17 ENCOUNTER — Encounter (HOSPITAL_COMMUNITY): Payer: Self-pay | Admitting: Emergency Medicine

## 2013-03-17 DIAGNOSIS — J4541 Moderate persistent asthma with (acute) exacerbation: Secondary | ICD-10-CM

## 2013-03-17 DIAGNOSIS — I10 Essential (primary) hypertension: Secondary | ICD-10-CM | POA: Insufficient documentation

## 2013-03-17 DIAGNOSIS — Z87891 Personal history of nicotine dependence: Secondary | ICD-10-CM | POA: Insufficient documentation

## 2013-03-17 DIAGNOSIS — Z79899 Other long term (current) drug therapy: Secondary | ICD-10-CM | POA: Insufficient documentation

## 2013-03-17 DIAGNOSIS — J45901 Unspecified asthma with (acute) exacerbation: Secondary | ICD-10-CM | POA: Insufficient documentation

## 2013-03-17 MED ORDER — ALBUTEROL SULFATE (5 MG/ML) 0.5% IN NEBU
5.0000 mg | INHALATION_SOLUTION | Freq: Once | RESPIRATORY_TRACT | Status: AC
  Administered 2013-03-17: 5 mg via RESPIRATORY_TRACT
  Filled 2013-03-17: qty 1

## 2013-03-17 MED ORDER — FLUTICASONE-SALMETEROL 250-50 MCG/DOSE IN AEPB: 1.0000 | INHALATION_SPRAY | Freq: Two times a day (BID) | RESPIRATORY_TRACT | Status: DC

## 2013-03-17 MED ORDER — ALBUTEROL SULFATE HFA 108 (90 BASE) MCG/ACT IN AERS
1.0000 | INHALATION_SPRAY | RESPIRATORY_TRACT | Status: DC | PRN
  Administered 2013-03-17: 2 via RESPIRATORY_TRACT
  Filled 2013-03-17: qty 6.7

## 2013-03-17 MED ORDER — IPRATROPIUM BROMIDE 0.02 % IN SOLN
0.5000 mg | Freq: Once | RESPIRATORY_TRACT | Status: AC
  Administered 2013-03-17: 0.5 mg via RESPIRATORY_TRACT
  Filled 2013-03-17: qty 2.5

## 2013-03-17 NOTE — ED Provider Notes (Addendum)
CSN: [id number]     Arrival date & time 03/17/13  1853 History   First MD Initiated Contact with Patient 03/17/13 1937     Chief Complaint  Patient presents with  . Shortness of Breath   (Consider location/radiation/quality/duration/timing/severity/associated sxs/prior Treatment) Patient is a 22 y.o. female presenting with shortness of breath. The history is provided by the patient.  Shortness of Breath Severity:  Moderate Onset quality:  Sudden Duration:  2 hours Timing:  Constant Progression:  Resolved Chronicity:  Recurrent Context: weather changes   Context: not URI   Relieved by:  Inhaler (ran out of her inhaler today so did not have a back up) Worsened by:  Activity Ineffective treatments:  None tried Associated symptoms: cough and wheezing   Associated symptoms: no abdominal pain, no chest pain, no ear pain, no fever, no rash, no sore throat and no sputum production   Risk factors: no recent alcohol use, no oral contraceptive use, no recent surgery and no tobacco use     Past Medical History  Diagnosis Date  . No pertinent past medical history   . Hypertension   . Asthma    Past Surgical History  Procedure Laterality Date  . Cholecystectomy    . Tonsillectomy    . Nasal septum surgery     Family History  Problem Relation Age of Onset  . Anesthesia problems Neg Hx    History  Substance Use Topics  . Smoking status: Former Games developer  . Smokeless tobacco: Never Used  . Alcohol Use: No   OB History   Grav Para Term Preterm Abortions TAB SAB Ect Mult Living   2 2 2  0 0 0 0 0 0 2     Review of Systems  Constitutional: Negative for fever.  HENT: Negative for ear pain and sore throat.   Respiratory: Positive for cough, shortness of breath and wheezing. Negative for sputum production.   Cardiovascular: Negative for chest pain.  Gastrointestinal: Negative for abdominal pain.  Skin: Negative for rash.  All other systems reviewed and are negative.    Allergies   Fish allergy  Home Medications   Current Outpatient Rx  Name  Route  Sig  Dispense  Refill  . albuterol (PROVENTIL HFA;VENTOLIN HFA) 108 (90 BASE) MCG/ACT inhaler   Inhalation   Inhale 2 puffs into the lungs every 6 (six) hours as needed. For shortness of breath         . aspirin-acetaminophen-caffeine (EXCEDRIN MIGRAINE) 250-250-65 MG per tablet   Oral   Take 2 tablets by mouth every 6 (six) hours as needed for pain.         . cetirizine (ZYRTEC) 10 MG tablet   Oral   Take 10 mg by mouth daily as needed for allergies.         Marland Kitchen ibuprofen (ADVIL,MOTRIN) 200 MG tablet   Oral   Take 600 mg by mouth every 6 (six) hours as needed for pain.         Marland Kitchen levonorgestrel (MIRENA) 20 MCG/24HR IUD   Intrauterine   1 each by Intrauterine route continuous.          Marland Kitchen tetrahydrozoline 0.05 % ophthalmic solution   Both Eyes   Place 2 drops into both eyes daily as needed (for redness).          BP 114/82  Pulse 66  Temp(Src) 97.8 F (36.6 C)  Resp 24  SpO2 98% Physical Exam  Nursing note and vitals reviewed. Constitutional: She  is oriented to person, place, and time. She appears well-developed and well-nourished. No distress.  HENT:  Head: Normocephalic and atraumatic.  Mouth/Throat: Oropharynx is clear and moist.  Eyes: Conjunctivae and EOM are normal. Pupils are equal, round, and reactive to light.  Neck: Normal range of motion. Neck supple.  Cardiovascular: Normal rate, regular rhythm and intact distal pulses.   No murmur heard. Pulmonary/Chest: Effort normal and breath sounds normal. No respiratory distress. She has no wheezes. She has no rales.  Abdominal: Soft. She exhibits no distension. There is no tenderness. There is no rebound and no guarding.  Musculoskeletal: Normal range of motion. She exhibits no edema and no tenderness.  Neurological: She is alert and oriented to person, place, and time.  Skin: Skin is warm and dry. No rash noted. No erythema.   Psychiatric: She has a normal mood and affect. Her behavior is normal.    ED Course  Procedures (including critical care time) Labs Review Labs Reviewed - No data to display Imaging Review No results found.  MDM   1. Asthma exacerbation, non-allergic, moderate persistent     Pt with typical asthma exacerbation  Symptoms ran out of inhaler.  No infectious sx, productive cough or other complaints.  Wheezing on exam.  Given albuterol/atrovent in triage and now no wheezing and feeling great.  Pt would probably benefit from advair as she is using inhaler 4or more times a week.  Given ppx.    Gwyneth Sprout, MD 03/17/13 2014  Gwyneth Sprout, MD 03/17/13 2016

## 2013-03-17 NOTE — ED Notes (Signed)
The  Pt is c/o sob today she has asthma and her inhaler ran out yesterday.  No recent cold

## 2013-03-17 NOTE — ED Notes (Signed)
Pt reports running out of inhaler yesterday. Hx of asthma. States today she was working and was around cat dander, and around 1730 SOB became increased. States she uses inhaler every other day. Lung sounds clear, 99% RA. MD Plunkett at bedside.

## 2013-03-23 ENCOUNTER — Encounter (HOSPITAL_COMMUNITY): Payer: Self-pay | Admitting: Emergency Medicine

## 2013-03-23 ENCOUNTER — Emergency Department (HOSPITAL_COMMUNITY): Payer: Medicaid Other

## 2013-03-23 ENCOUNTER — Emergency Department (HOSPITAL_COMMUNITY)
Admission: EM | Admit: 2013-03-23 | Discharge: 2013-03-23 | Disposition: A | Discharge status: Home / Self Care | Payer: Medicaid Other | Attending: Emergency Medicine | Admitting: Emergency Medicine

## 2013-03-23 DIAGNOSIS — Z79899 Other long term (current) drug therapy: Secondary | ICD-10-CM | POA: Insufficient documentation

## 2013-03-23 DIAGNOSIS — I10 Essential (primary) hypertension: Secondary | ICD-10-CM | POA: Insufficient documentation

## 2013-03-23 DIAGNOSIS — N949 Unspecified condition associated with female genital organs and menstrual cycle: Secondary | ICD-10-CM | POA: Insufficient documentation

## 2013-03-23 DIAGNOSIS — Z87891 Personal history of nicotine dependence: Secondary | ICD-10-CM | POA: Insufficient documentation

## 2013-03-23 DIAGNOSIS — J45909 Unspecified asthma, uncomplicated: Secondary | ICD-10-CM | POA: Insufficient documentation

## 2013-03-23 DIAGNOSIS — R109 Unspecified abdominal pain: Secondary | ICD-10-CM | POA: Insufficient documentation

## 2013-03-23 DIAGNOSIS — N938 Other specified abnormal uterine and vaginal bleeding: Secondary | ICD-10-CM | POA: Insufficient documentation

## 2013-03-23 DIAGNOSIS — N83209 Unspecified ovarian cyst, unspecified side: Secondary | ICD-10-CM | POA: Insufficient documentation

## 2013-03-23 DIAGNOSIS — Z3202 Encounter for pregnancy test, result negative: Secondary | ICD-10-CM | POA: Insufficient documentation

## 2013-03-23 DIAGNOSIS — Z30431 Encounter for routine checking of intrauterine contraceptive device: Secondary | ICD-10-CM | POA: Insufficient documentation

## 2013-03-23 LAB — URINALYSIS, ROUTINE W REFLEX MICROSCOPIC
Bilirubin Urine: NEGATIVE
Glucose, UA: NEGATIVE mg/dL
Ketones, ur: NEGATIVE mg/dL
pH: 5.5 (ref 5.0–8.0)

## 2013-03-23 LAB — CBC WITH DIFFERENTIAL/PLATELET
Basophils Absolute: 0 10*3/uL (ref 0.0–0.1)
Eosinophils Relative: 3 % (ref 0–5)
HCT: 41 % (ref 36.0–46.0)
Hemoglobin: 13.9 g/dL (ref 12.0–15.0)
MCH: 30.3 pg (ref 26.0–34.0)
MCHC: 33.9 g/dL (ref 30.0–36.0)
Monocytes Relative: 6 % (ref 3–12)
Neutro Abs: 10.1 10*3/uL — ABNORMAL HIGH (ref 1.7–7.7)

## 2013-03-23 LAB — POCT PREGNANCY, URINE: Preg Test, Ur: NEGATIVE

## 2013-03-23 NOTE — ED Provider Notes (Signed)
CSN: [id number]     Arrival date & time 03/23/13  4098 History   First MD Initiated Contact with Patient 03/23/13 0703     Chief Complaint  Patient presents with  . Abdominal Pain   (Consider location/radiation/quality/duration/timing/severity/associated sxs/prior Treatment) HPI Patient presents with concern of abdominal pain.  Pain began without clear pressure of approximately 4 hours ago.  Since onset the pain is waxing/waning occurring and using without clear exacerbating or alleviating or precipitating factors.  The pain is severe, sharp, lower abdominal radiation to the sides equally.  There is concurrent vaginal spotting, no new discharge. No new nausea, vomiting, diarrhea.   Past Medical History  Diagnosis Date  . No pertinent past medical history   . Hypertension   . Asthma    Past Surgical History  Procedure Laterality Date  . Cholecystectomy    . Tonsillectomy    . Nasal septum surgery     Family History  Problem Relation Age of Onset  . Anesthesia problems Neg Hx    History  Substance Use Topics  . Smoking status: Former Games developer  . Smokeless tobacco: Never Used  . Alcohol Use: No   OB History   Grav Para Term Preterm Abortions TAB SAB Ect Mult Living   2 2 2  0 0 0 0 0 0 2     Review of Systems  Constitutional:       Per HPI, otherwise negative  HENT:       Per HPI, otherwise negative  Respiratory:       Per HPI, otherwise negative  Cardiovascular:       Per HPI, otherwise negative  Gastrointestinal: Positive for abdominal pain. Negative for nausea and vomiting.  Endocrine:       Negative aside from HPI  Genitourinary: Positive for vaginal bleeding. Negative for hematuria, flank pain, decreased urine volume, vaginal pain and pelvic pain.          Musculoskeletal:       Per HPI, otherwise negative  Skin: Negative.   Neurological: Negative for syncope.    Allergies  Fish allergy  Home Medications   Current Outpatient Rx  Name  Route  Sig   Dispense  Refill  . albuterol (PROVENTIL HFA;VENTOLIN HFA) 108 (90 BASE) MCG/ACT inhaler   Inhalation   Inhale 2 puffs into the lungs every 6 (six) hours as needed. For shortness of breath         . aspirin-acetaminophen-caffeine (EXCEDRIN MIGRAINE) 250-250-65 MG per tablet   Oral   Take 2 tablets by mouth every 6 (six) hours as needed for pain.         . cetirizine (ZYRTEC) 10 MG tablet   Oral   Take 10 mg by mouth daily as needed for allergies.         . Fluticasone-Salmeterol (ADVAIR DISKUS) 250-50 MCG/DOSE AEPB   Inhalation   Inhale 1 puff into the lungs 2 (two) times daily.   60 each   1   . ibuprofen (ADVIL,MOTRIN) 200 MG tablet   Oral   Take 600 mg by mouth every 6 (six) hours as needed for pain.         Marland Kitchen levonorgestrel (MIRENA) 20 MCG/24HR IUD   Intrauterine   1 each by Intrauterine route continuous.          Marland Kitchen tetrahydrozoline 0.05 % ophthalmic solution   Both Eyes   Place 2 drops into both eyes daily as needed (for redness).  BP 130/89  Pulse 92  Temp(Src) 98.2 F (36.8 C) (Oral)  Resp 16  SpO2 100% Physical Exam  Nursing note and vitals reviewed. Constitutional: She is oriented to person, place, and time. She appears well-developed and well-nourished. No distress.  HENT:  Head: Normocephalic and atraumatic.  Eyes: Conjunctivae and EOM are normal.  Cardiovascular: Normal rate and regular rhythm.   Pulmonary/Chest: Effort normal and breath sounds normal. No stridor. No respiratory distress.  Abdominal: She exhibits no distension. There is tenderness in the suprapubic area. There is guarding. There is no rigidity, no rebound and no CVA tenderness.  Musculoskeletal: She exhibits no edema.  Neurological: She is alert and oriented to person, place, and time. No cranial nerve deficit.  Skin: Skin is warm and dry.  Psychiatric: She has a normal mood and affect.    ED Course  Procedures (including critical care time) Labs Review Labs  Reviewed  URINALYSIS, ROUTINE W REFLEX MICROSCOPIC  CBC WITH DIFFERENTIAL   Imaging Review No results found.  EKG Interpretation   None      ultrasound demonstrates right adnexal cyst, no other notable findings.  Vitals, labs were reassuring after EEG monitoring for several hours.  MDM  No diagnosis found. The patient presents with new mild right lower abdominal pain, vaginal spotting.  On exam she is awake alert, with no peritoneal abdomen, no fever, no hypertension, no tachycardia, and little suspicion for acute systemic pathology.  However, with her intrauterine device, there is some suspicion for either migration or infection.  Patient's evaluation was largely reassuring.  Ultrasound did demonstrate an ovarian cyst, though the patient is appropriate for further evaluation and management as an outpatient.  She was discharged in stable condition [facility].    Gerhard Munch, MD 03/23/13 1118

## 2013-03-23 NOTE — ED Notes (Signed)
EMT at bedside collecting CBC.

## 2013-03-23 NOTE — ED Notes (Signed)
Patient reports having Mid right sided abd pain that she states "feel like contractions." Pt reports that it now radiates to her left side. Pt reports the pain comes in 10-15 minute intervals. Pt reports having spotting this morning and normal vaginal discharge. Pt has an IUD in place x1 year. Patients LBM was yesterday, but reports having trouble going.

## 2013-03-27 ENCOUNTER — Inpatient Hospital Stay (HOSPITAL_COMMUNITY)
Admission: AD | Admit: 2013-03-27 | Discharge: 2013-03-28 | Disposition: A | Discharge status: Home / Self Care | Payer: Self-pay | Source: Ambulatory Visit | Attending: Family Medicine | Admitting: Family Medicine

## 2013-03-27 DIAGNOSIS — R1033 Periumbilical pain: Secondary | ICD-10-CM | POA: Insufficient documentation

## 2013-03-27 DIAGNOSIS — R197 Diarrhea, unspecified: Secondary | ICD-10-CM | POA: Insufficient documentation

## 2013-03-27 DIAGNOSIS — N83201 Unspecified ovarian cyst, right side: Secondary | ICD-10-CM

## 2013-03-27 DIAGNOSIS — K529 Noninfective gastroenteritis and colitis, unspecified: Secondary | ICD-10-CM

## 2013-03-27 DIAGNOSIS — R1110 Vomiting, unspecified: Secondary | ICD-10-CM | POA: Insufficient documentation

## 2013-03-27 DIAGNOSIS — N83209 Unspecified ovarian cyst, unspecified side: Secondary | ICD-10-CM | POA: Insufficient documentation

## 2013-03-27 NOTE — MAU Note (Signed)
Was seen at hosp several days ago and told had ovarian cyst. Had diarrhea and vomited earlier and saw blood in emesis. Was having abd pain and thinks vomited due to the pain.

## 2013-03-28 ENCOUNTER — Encounter (HOSPITAL_COMMUNITY): Payer: Self-pay | Admitting: *Deleted

## 2013-03-28 ENCOUNTER — Inpatient Hospital Stay (HOSPITAL_COMMUNITY): Payer: Medicaid Other

## 2013-03-28 DIAGNOSIS — N83209 Unspecified ovarian cyst, unspecified side: Secondary | ICD-10-CM

## 2013-03-28 LAB — COMPREHENSIVE METABOLIC PANEL
ALT: 37 U/L — ABNORMAL HIGH (ref 0–35)
BUN: 7 mg/dL (ref 6–23)
CO2: 28 mEq/L (ref 19–32)
Calcium: 9.3 mg/dL (ref 8.4–10.5)
Creatinine, Ser: 0.69 mg/dL (ref 0.50–1.10)
GFR calc Af Amer: >90 mL/min (ref >90)
GFR calc non Af Amer: >90 mL/min (ref >90)
Glucose, Bld: 87 mg/dL (ref 70–99)

## 2013-03-28 LAB — CBC WITH DIFFERENTIAL/PLATELET
Eosinophils Absolute: 0.3 10*3/uL (ref 0.0–0.7)
Eosinophils Relative: 3 % (ref 0–5)
HCT: 40.2 % (ref 36.0–46.0)
Lymphocytes Relative: 36 % (ref 12–46)
Lymphs Abs: 3 10*3/uL (ref 0.7–4.0)
MCH: 30.2 pg (ref 26.0–34.0)
MCV: 87.4 fL (ref 78.0–100.0)
Monocytes Absolute: 0.6 10*3/uL (ref 0.1–1.0)
RBC: 4.6 MIL/uL (ref 3.87–5.11)
WBC: 8.3 10*3/uL (ref 4.0–10.5)

## 2013-03-28 LAB — URINALYSIS, ROUTINE W REFLEX MICROSCOPIC
Bilirubin Urine: NEGATIVE
Ketones, ur: NEGATIVE mg/dL
Leukocytes, UA: NEGATIVE
Nitrite: NEGATIVE
Specific Gravity, Urine: 1.015 (ref 1.005–1.030)
Urobilinogen, UA: 0.2 mg/dL (ref 0.0–1.0)

## 2013-03-28 LAB — URINE MICROSCOPIC-ADD ON

## 2013-03-28 MED ORDER — ONDANSETRON HCL 4 MG PO TABS
8.0000 mg | ORAL_TABLET | Freq: Once | ORAL | Status: AC
Start: 2013-03-28 — End: 2013-03-28
  Administered 2013-03-28: 8 mg via ORAL
  Filled 2013-03-28 (×2): qty 2

## 2013-03-28 MED ORDER — TRAMADOL HCL 50 MG PO TABS: 50.0000 mg | ORAL_TABLET | Freq: Four times a day (QID) | ORAL | Status: DC | PRN

## 2013-03-28 MED ORDER — TRAMADOL HCL 50 MG PO TABS
50.0000 mg | ORAL_TABLET | Freq: Once | ORAL | Status: AC
  Administered 2013-03-28: 50 mg via ORAL
  Filled 2013-03-28: qty 1

## 2013-03-28 MED ORDER — ONDANSETRON HCL 4 MG PO TABS: 4.0000 mg | ORAL_TABLET | Freq: Three times a day (TID) | ORAL | Status: DC | PRN

## 2013-03-28 NOTE — MAU Provider Note (Signed)
History     CSN: [id number]  Arrival date and time: 03/27/13 2349   First Provider Initiated Contact with Patient 03/28/13 0042      Chief Complaint  Patient presents with  . Cyst   HPI This is a 22 y.o. female who presents with c/o onset of vomiting and diarrhea for 2 days. Saw a "little bit of blood" in emesis. Has a known 4.5cm simple ovarian cyst on right. Pain now is mid-abdominal, does not have much pain on RLQ.  Also worried because she had one episode of spotting.  Has Mirena IUD x 1 year.  [place] from ER visit showed IUD in place.  Has appt in clinic in November.   RN Note: Was seen at hosp several days ago and told had ovarian cyst. Had diarrhea and vomited earlier and saw blood in emesis. Was having abd pain and thinks vomited due to the pain.       OB History   Grav Para Term Preterm Abortions TAB SAB Ect Mult Living   2 2 2  0 0 0 0 0 0 2      Past Medical History  Diagnosis Date  . No pertinent past medical history   . Hypertension   . Asthma     Past Surgical History  Procedure Laterality Date  . Cholecystectomy    . Tonsillectomy    . Nasal septum surgery      Family History  Problem Relation Age of Onset  . Anesthesia problems Neg Hx     History  Substance Use Topics  . Smoking status: Former Games developer  . Smokeless tobacco: Never Used  . Alcohol Use: No    Allergies:  Allergies  Allergen Reactions  . Fish Allergy Swelling    Prescriptions prior to admission  Medication Sig Dispense Refill  . albuterol (PROVENTIL HFA;VENTOLIN HFA) 108 (90 BASE) MCG/ACT inhaler Inhale 2 puffs into the lungs every 6 (six) hours as needed. For shortness of breath      . levonorgestrel (MIRENA) 20 MCG/24HR IUD 1 each by Intrauterine route continuous.         Review of Systems  Constitutional: Negative for fever, chills and malaise/fatigue.  Gastrointestinal: Positive for nausea, vomiting, abdominal pain (periumbical) and diarrhea. Negative for constipation.   Genitourinary: Negative for dysuria.  Neurological: Negative for dizziness and weakness.   Physical Exam   Blood pressure 127/80, pulse 83, temperature 98.5 F (36.9 C), height 5\' 5"  (1.651 m), weight 90.447 kg (199 lb 6.4 oz), SpO2 99.00%.  Physical Exam  Constitutional: She is oriented to person, place, and time. She appears well-developed and well-nourished. No distress.  Cardiovascular: Normal rate.   Respiratory: Effort normal.  GI: Soft. She exhibits no distension and no mass. There is tenderness (very slight tenderness periumbilical, none RLQ). There is no rebound and no guarding.  Genitourinary: Uterus normal. Vaginal discharge (small spotting) found.  Right adnexae tender with bimanual exam   Musculoskeletal: Normal range of motion.  Neurological: She is alert and oriented to person, place, and time.  Skin: Skin is warm and dry.  Psychiatric: She has a normal mood and affect.    MAU Course  Procedures  MDM In light of movement of pain to umbilicus, and new N/V/D, will recheck CBC to rule out infection/appy and check limited US to r/o torsion.  Results for orders placed during the hospital encounter of 03/27/13 (from the past 24 hour(s))  URINALYSIS, ROUTINE W REFLEX MICROSCOPIC     Status: Abnormal  Collection Time    03/28/13 12:05 AM      Result Value Range   Color, Urine YELLOW  YELLOW   APPearance CLEAR  CLEAR   Specific Gravity, Urine 1.015  1.005 - 1.030   pH 8.0  5.0 - 8.0   Glucose, UA NEGATIVE  NEGATIVE mg/dL   Hgb urine dipstick SMALL (*) NEGATIVE   Bilirubin Urine NEGATIVE  NEGATIVE   Ketones, ur NEGATIVE  NEGATIVE mg/dL   Protein, ur NEGATIVE  NEGATIVE mg/dL   Urobilinogen, UA 0.2  0.0 - 1.0 mg/dL   Nitrite NEGATIVE  NEGATIVE   Leukocytes, UA NEGATIVE  NEGATIVE  URINE MICROSCOPIC-ADD ON     Status: Abnormal   Collection Time    03/28/13 12:05 AM      Result Value Range   Squamous Epithelial / LPF FEW (*) RARE   WBC, UA 0-2  <3 WBC/hpf   RBC /  HPF 3-6  <3 RBC/hpf   Bacteria, UA FEW (*) RARE   Urine-Other MUCOUS PRESENT    CBC WITH DIFFERENTIAL     Status: None   Collection Time    03/28/13  1:20 AM      Result Value Range   WBC 8.3  4.0 - 10.5 K/uL   RBC 4.60  3.87 - 5.11 MIL/uL   Hemoglobin 13.9  12.0 - 15.0 g/dL   HCT 30.8  65.7 - 84.6 %   MCV 87.4  78.0 - 100.0 fL   MCH 30.2  26.0 - 34.0 pg   MCHC 34.6  30.0 - 36.0 g/dL   RDW 96.2  95.2 - 84.1 %   Platelets 248  150 - 400 K/uL   Neutrophils Relative % 53  43 - 77 %   Neutro Abs 4.4  1.7 - 7.7 K/uL   Lymphocytes Relative 36  12 - 46 %   Lymphs Abs 3.0  0.7 - 4.0 K/uL   Monocytes Relative 7  3 - 12 %   Monocytes Absolute 0.6  0.1 - 1.0 K/uL   Eosinophils Relative 3  0 - 5 %   Eosinophils Absolute 0.3  0.0 - 0.7 K/uL   Basophils Relative 1  0 - 1 %   Basophils Absolute 0.0  0.0 - 0.1 K/uL  COMPREHENSIVE METABOLIC PANEL     Status: Abnormal   Collection Time    03/28/13  1:20 AM      Result Value Range   Sodium 138  135 - 145 mEq/L   Potassium 4.0  3.5 - 5.1 mEq/L   Chloride 101  96 - 112 mEq/L   CO2 28  19 - 32 mEq/L   Glucose, Bld 87  70 - 99 mg/dL   BUN 7  6 - 23 mg/dL   Creatinine, Ser 3.24  0.50 - 1.10 mg/dL   Calcium 9.3  8.4 - 40.1 mg/dL   Total Protein 7.0  6.0 - 8.3 g/dL   Albumin 3.7  3.5 - 5.2 g/dL   AST 22  0 - 37 U/L   ALT 37 (*) 0 - 35 U/L   Alkaline Phosphatase 100  39 - 117 U/L   Total Bilirubin 0.2 (*) 0.3 - 1.2 mg/dL   GFR calc non Af Amer >90  >90 mL/min   GFR calc Af Amer >90  >90 mL/min   US Transvaginal Non-ob  03/28/2013   CLINICAL DATA:  Known right ovarian cyst. Vomiting, diarrhea, pain. Rule out torsion.  EXAM: TRANSVAGINAL ULTRASOUND OF PELVIS  DOPPLER  ULTRASOUND OF OVARIES  TECHNIQUE: Transvaginal ultrasound examination of the pelvis was performed including evaluation of the uterus, ovaries, adnexal regions, and pelvic cul-de-sac.  Color and duplex Doppler ultrasound was utilized to evaluate blood flow to the ovaries.  COMPARISON:   03/23/2013  FINDINGS: Uterus  Measurements: 8 x 4 x 4 cm. No fibroids or other mass visualized. There is a IUD which appears normally position, with side bar fully extended and flush with the fundic endometrial canal.  Endometrium  Thickness: Difficult to discern due to IUD with shadowing.  Right ovary  Measurements: 6.3 x 4.5 x 4.4 cm. Asymmetric enlargement due to a 5.9 x 4 by 4.2 cm simple appearing cyst. The ovarian parenchyma around the cyst does not appear edematous.  Left Ovary  3 x 1.7 x 2.2 cm. Normal appearance/no adnexal mass.  Pulsed Doppler evaluation demonstrates normal low-resistance arterial and venous waveforms in both ovaries.  Other: Small volume free fluid.    IMPRESSION: 1. No sonographic evidence for ovarian torsion. 2. Simple appearing 6 cm right ovarian cyst. This is almost certainly benign, but follow up ultrasound is recommended in 1 year according to the Society of Radiologists in Ultrasound2010 Consensus Conference Statement (D Lenis Noon et al. Management of Asymptomatic Ovarian and Other Adnexal Cysts Imaged at [place]: Society of Radiologists in Ultrasound Consensus Conference Statement 2010. Radiology 256 (Sept 2010): 943-954.). 3. Normally positioned IUD.     Electronically Signed   By: Tiburcio Pea M.D.   On: 03/28/2013 02:18   [place] Art/ven Flow Abd Pelv Doppler  03/28/2013   CLINICAL DATA:  Known right ovarian cyst. Vomiting, diarrhea, pain. Rule out torsion.  EXAM: TRANSVAGINAL ULTRASOUND OF PELVIS  DOPPLER ULTRASOUND OF OVARIES  TECHNIQUE: Transvaginal ultrasound examination of the pelvis was performed including evaluation of the uterus, ovaries, adnexal regions, and pelvic cul-de-sac.  Color and duplex Doppler ultrasound was utilized to evaluate blood flow to the ovaries.  COMPARISON:  03/23/2013  FINDINGS: Uterus  Measurements: 8 x 4 x 4 cm. No fibroids or other mass visualized. There is a IUD which appears normally position, with side bar fully extended and flush with the  fundic endometrial canal.  Endometrium  Thickness: Difficult to discern due to IUD with shadowing.  Right ovary  Measurements: 6.3 x 4.5 x 4.4 cm. Asymmetric enlargement due to a 5.9 x 4 by 4.2 cm simple appearing cyst. The ovarian parenchyma around the cyst does not appear edematous.  Left Ovary  3 x 1.7 x 2.2 cm. Normal appearance/no adnexal mass.  Pulsed Doppler evaluation demonstrates normal low-resistance arterial and venous waveforms in both ovaries.  Other: Small volume free fluid.    IMPRESSION: 1. No sonographic evidence for ovarian torsion. 2. Simple appearing 6 cm right ovarian cyst. This is almost certainly benign, but follow up ultrasound is recommended in 1 year according to the Society of Radiologists in Ultrasound2010 Consensus Conference Statement (D Lenis Noon et al. Management of Asymptomatic Ovarian and Other Adnexal Cysts Imaged at [place]: Society of Radiologists in Ultrasound Consensus Conference Statement 2010. Radiology 256 (Sept 2010): 943-954.). 3. Normally positioned IUD.     Electronically Signed   By: Tiburcio Pea M.D.   On: 03/28/2013 02:18    Assessment and Plan  A:  Periumbilical pain, probably related to gastroenteritis      Ovarian simple cyst, no evidence of torsion  P:  Discussed results       Reviewed supportive care for gastroenteritis       Rx Tramadol and Zofran  Come back if pain worsens       [facility]   [facility] 03/28/2013, 12:58 AM

## 2013-03-28 NOTE — MAU Provider Note (Signed)
Chart reviewed and agree with management and plan.  

## 2013-03-29 LAB — POCT PREGNANCY, URINE: Preg Test, Ur: NEGATIVE

## 2013-03-30 NOTE — Progress Notes (Signed)
ED CM received call from patient regarding medication being too expensive. Pt stated, she was started on  Advair Diskus 250/50, and  received a prescription in [facility] ED  on 03/23/13. Pt reports she tried to fill the prescription, but too expensive. Patient reports that she does not have insurance. Discussed the trail offer on Advair Diskus, patient appreciative and agrees with plan. Coupon printed and faxed to [employer] on [address]. Received fax confirmation report. Notified patient, and  instructed patient to check with pharmacy when ready. Pt verbalized understanding. No further CM needs identified. Pt also reminded to f/u with [facility] and [facility], information provided over the phone.

## 2013-05-05 ENCOUNTER — Encounter: Payer: Medicaid Other | Admitting: Obstetrics & Gynecology

## 2013-05-20 ENCOUNTER — Other Ambulatory Visit: Payer: Self-pay | Admitting: Emergency Medicine

## 2013-05-20 ENCOUNTER — Ambulatory Visit: Payer: Self-pay | Attending: Internal Medicine | Admitting: Internal Medicine

## 2013-05-20 ENCOUNTER — Telehealth: Payer: Self-pay | Admitting: *Deleted

## 2013-05-20 ENCOUNTER — Encounter: Payer: Self-pay | Admitting: Internal Medicine

## 2013-05-20 ENCOUNTER — Encounter (INDEPENDENT_AMBULATORY_CARE_PROVIDER_SITE_OTHER): Payer: Self-pay

## 2013-05-20 VITALS — BP 133/88 | HR 80 | Temp 98.7°F | Resp 14 | Ht 64.0 in | Wt 201.6 lb

## 2013-05-20 DIAGNOSIS — J45901 Unspecified asthma with (acute) exacerbation: Secondary | ICD-10-CM | POA: Insufficient documentation

## 2013-05-20 DIAGNOSIS — Z0000 Encounter for general adult medical examination without abnormal findings: Secondary | ICD-10-CM | POA: Insufficient documentation

## 2013-05-20 DIAGNOSIS — J45909 Unspecified asthma, uncomplicated: Secondary | ICD-10-CM

## 2013-05-20 DIAGNOSIS — I10 Essential (primary) hypertension: Secondary | ICD-10-CM

## 2013-05-20 MED ORDER — LORATADINE 10 MG PO TABS: 10.0000 mg | ORAL_TABLET | Freq: Every day | ORAL | Status: DC | PRN

## 2013-05-20 MED ORDER — MONTELUKAST SODIUM 10 MG PO TABS: 10.0000 mg | ORAL_TABLET | Freq: Every day | ORAL | Status: DC

## 2013-05-20 MED ORDER — ALBUTEROL SULFATE HFA 108 (90 BASE) MCG/ACT IN AERS: 2.0000 | INHALATION_SPRAY | Freq: Four times a day (QID) | RESPIRATORY_TRACT | Status: DC | PRN

## 2013-05-20 NOTE — Telephone Encounter (Signed)
Scripts called in [facility] [address] per pt request

## 2013-05-20 NOTE — Telephone Encounter (Signed)
Contacted pt to inform her that her prescriptions were left in the clinic. Left a message on her voicemail.

## 2013-05-20 NOTE — Progress Notes (Unsigned)
Patient ID: Catherine Porter, female   DOB: November 26, 1990, 22 y.o.   MRN: [id number] Patient Demographics  Catherine Porter, is a 22 y.o. female  [id number]  [id number]  DOB - 09-15-1990  Chief Complaint  Patient presents with  . Establish Care        Subjective:   Catherine Porter today is here to establish primary care.  Patient is a 22 year old female with history of asthma presented to the clinic to establish care. Patient notes that she had noticed some wheezing 3 days ago, especially when going up and down the flight of stairs to her third-floor apartment. Otherwise she has been doing fairly well.  Patient has No headache, No chest pain, No abdominal pain - No Nausea, No new weakness tingling or numbness, No Cough - SOB.   Objective:    Filed Vitals:   05/20/13 1407  BP: 133/88  Pulse: 80  Temp: 98.7 F (37.1 C)  TempSrc: Oral  Resp: 14  Height: 5\' 4"  (1.626 m)  Weight: 201 lb 9.6 oz (91.445 kg)  SpO2: 98%     ALLERGIES:   Allergies  Allergen Reactions  . Fish Allergy Swelling    PAST MEDICAL HISTORY: Past Medical History  Diagnosis Date  . No pertinent past medical history   . Hypertension   . Asthma     PAST SURGICAL HISTORY: Past Surgical History  Procedure Laterality Date  . Cholecystectomy    . Tonsillectomy    . Nasal septum surgery      FAMILY HISTORY: Family History  Problem Relation Age of Onset  . Anesthesia problems Neg Hx     MEDICATIONS AT HOME: Prior to Admission medications   Medication Sig Start Date End Date Taking? Authorizing Provider  albuterol (PROVENTIL HFA;VENTOLIN HFA) 108 (90 BASE) MCG/ACT inhaler Inhale 2 puffs into the lungs every 6 (six) hours as needed for wheezing or shortness of breath. 05/20/13   Ripudeep Jenna Luo, MD  levonorgestrel (MIRENA) 20 MCG/24HR IUD 1 each by Intrauterine route continuous.     Historical Provider, MD  loratadine (CLARITIN) 10 MG tablet Take 1 tablet (10 mg total) by mouth daily as needed  for allergies or rhinitis. 05/20/13   Ripudeep Jenna Luo, MD  montelukast (SINGULAIR) 10 MG tablet Take 1 tablet (10 mg total) by mouth at bedtime. 05/20/13   Ripudeep Jenna Luo, MD  ondansetron (ZOFRAN) 4 MG tablet Take 1 tablet (4 mg total) by mouth every 8 (eight) hours as needed for nausea. 03/28/13   Aviva Signs, CNM  traMADol (ULTRAM) 50 MG tablet Take 1 tablet (50 mg total) by mouth every 6 (six) hours as needed for pain. 03/28/13   Aviva Signs, CNM    REVIEW OF SYSTEMS:  Constitutional:   No   Fevers, chills, fatigue.  HEENT:    No headaches, Sore throat,   Cardio-vascular: No chest pain,  Orthopnea, swelling in lower extremities, anasarca, palpitations  GI:  No abdominal pain, nausea, vomiting, diarrhea  Resp: No shortness of breath,  No coughing up of blood.No cough.No wheezing.  Skin:  no rash or lesions.  GU:  no dysuria, change in color of urine, no urgency or frequency.  No flank pain.  Musculoskeletal: No joint pain or swelling.  No decreased range of motion.  No back pain.  Psych: No change in mood or affect. No depression or anxiety.  No memory loss.   Exam  General appearance :Awake, alert, NAD, Speech Clear. HEENT: Atraumatic and Normocephalic, PERLA Neck:  supple, no JVD. No cervical lymphadenopathy.  Chest: clear to auscultation bilaterally, no wheezing, rales or rhonchi CVS: S1 S2 regular, no murmurs.  Abdomen: soft, NBS, NT, ND, no gaurding, rigidity or rebound. Extremities: No cyanosis, clubbing, B/L Lower Ext shows no edema,  Neurology: Awake alert, and oriented X 3, CN II-XII intact, Non focal Skin:No Rash or lesions Wounds: N/A    Data Review   Basic Metabolic Panel: No results found for this basename: NA, K, CL, CO2, GLUCOSE, BUN, CREATININE, CALCIUM, MG, PHOS,  in the last 168 hours Liver Function Tests: No results found for this basename: AST, ALT, ALKPHOS, BILITOT, PROT, ALBUMIN,  in the last 168 hours  CBC: No results found  for this basename: WBC, NEUTROABS, HGB, HCT, MCV, PLT,  in the last 168 hours ------------------------------------------------------------------------------------------------------------------ No results found for this basename: HGBA1C,  in the last 72 hours ------------------------------------------------------------------------------------------------------------------ No results found for this basename: CHOL, HDL, LDLCALC, TRIG, CHOLHDL, LDLDIRECT,  in the last 72 hours ------------------------------------------------------------------------------------------------------------------ No results found for this basename: TSH, T4TOTAL, FREET3, T3FREE, THYROIDAB,  in the last 72 hours ------------------------------------------------------------------------------------------------------------------ No results found for this basename: VITAMINB12, FOLATE, FERRITIN, TIBC, IRON, RETICCTPCT,  in the last 72 hours  Coagulation profile  No results found for this basename: INR, PROTIME,  in the last 168 hours    Assessment & Plan   Active Problems: Chronic asthma - Currently stable, no wheezing - Placed on albuterol inhaler 2 puffs every 6 hours as needed for wheezing - Placed on Singulair 10 mg daily, Claritin daily as needed -She occasionally smokes, counseled to quit smoking.   Preventive health screening - Patient reports that she had the Pap smear this year - Flu shot today   Recommendations: Labs reviewed from October 2014, fairly normal  Follow-up in 4 months   RAI,RIPUDEEP M.D. 05/20/2013, 2:26 PM

## 2013-09-04 ENCOUNTER — Encounter (HOSPITAL_COMMUNITY): Payer: Self-pay | Admitting: Emergency Medicine

## 2013-09-04 ENCOUNTER — Emergency Department (HOSPITAL_COMMUNITY): Payer: Self-pay

## 2013-09-04 ENCOUNTER — Emergency Department (HOSPITAL_COMMUNITY)
Admission: EM | Admit: 2013-09-04 | Discharge: 2013-09-04 | Disposition: A | Discharge status: Home / Self Care | Payer: Self-pay | Attending: Emergency Medicine | Admitting: Emergency Medicine

## 2013-09-04 DIAGNOSIS — J45901 Unspecified asthma with (acute) exacerbation: Secondary | ICD-10-CM | POA: Insufficient documentation

## 2013-09-04 DIAGNOSIS — F17200 Nicotine dependence, unspecified, uncomplicated: Secondary | ICD-10-CM | POA: Insufficient documentation

## 2013-09-04 DIAGNOSIS — Z79899 Other long term (current) drug therapy: Secondary | ICD-10-CM | POA: Insufficient documentation

## 2013-09-04 DIAGNOSIS — I10 Essential (primary) hypertension: Secondary | ICD-10-CM | POA: Insufficient documentation

## 2013-09-04 MED ORDER — PREDNISONE 20 MG PO TABS
60.0000 mg | ORAL_TABLET | Freq: Once | ORAL | Status: AC
  Administered 2013-09-04: 60 mg via ORAL
  Filled 2013-09-04: qty 3

## 2013-09-04 MED ORDER — ALBUTEROL SULFATE (2.5 MG/3ML) 0.083% IN NEBU
2.5000 mg | INHALATION_SOLUTION | Freq: Once | RESPIRATORY_TRACT | Status: AC
  Administered 2013-09-04: 2.5 mg via RESPIRATORY_TRACT
  Filled 2013-09-04: qty 3

## 2013-09-04 MED ORDER — PREDNISONE 10 MG PO TABS: 20.0000 mg | ORAL_TABLET | Freq: Every day | ORAL | Status: DC

## 2013-09-04 MED ORDER — HYDROCODONE-ACETAMINOPHEN 5-325 MG PO TABS
1.0000 | ORAL_TABLET | Freq: Once | ORAL | Status: AC
  Administered 2013-09-04: 1 via ORAL
  Filled 2013-09-04: qty 1

## 2013-09-04 MED ORDER — IPRATROPIUM-ALBUTEROL 0.5-2.5 (3) MG/3ML IN SOLN
3.0000 mL | Freq: Once | RESPIRATORY_TRACT | Status: AC
  Administered 2013-09-04: 3 mL via RESPIRATORY_TRACT
  Filled 2013-09-04: qty 3

## 2013-09-04 MED ORDER — IPRATROPIUM BROMIDE 0.02 % IN SOLN
0.5000 mg | Freq: Once | RESPIRATORY_TRACT | Status: AC
Start: 2013-09-04 — End: 2013-09-04
  Administered 2013-09-04: 0.5 mg via RESPIRATORY_TRACT
  Filled 2013-09-04: qty 2.5

## 2013-09-04 MED ORDER — ALBUTEROL SULFATE (2.5 MG/3ML) 0.083% IN NEBU: 5.0000 mg | INHALATION_SOLUTION | Freq: Once | RESPIRATORY_TRACT | Status: DC

## 2013-09-04 MED ORDER — ALBUTEROL SULFATE (2.5 MG/3ML) 0.083% IN NEBU
5.0000 mg | INHALATION_SOLUTION | Freq: Once | RESPIRATORY_TRACT | Status: AC
  Administered 2013-09-04: 5 mg via RESPIRATORY_TRACT
  Filled 2013-09-04: qty 6

## 2013-09-04 MED ORDER — IPRATROPIUM BROMIDE 0.02 % IN SOLN: 0.5000 mg | Freq: Once | RESPIRATORY_TRACT | Status: DC

## 2013-09-04 NOTE — ED Notes (Signed)
Pt ambulated to BR at this time.

## 2013-09-04 NOTE — ED Provider Notes (Signed)
CSN: [id number]     Arrival date & time 09/04/13  1741 History   First MD Initiated Contact with Patient 09/04/13 1742     Chief Complaint  Patient presents with  . Shortness of Breath  . Cough     (Consider location/radiation/quality/duration/timing/severity/associated sxs/prior Treatment) HPI  Patient to the ED with complaints of cough and asthma exacerbation. She reports it started last night and has been progressively getting worse, she has been using her inhaler which helps for a few seconds but then the asthma returns. She is supposed to be on Advair but has not been using it. She says that this happens to her often but she is unsure of what exacerbates it. She was at work moving around a lot when her symptoms became even more severe therefore she decided to go to the ER for treatment. Denies fevers, nausea, vomiting, diarrhea, sore throat, ear pain, abdominal pain.  Past Medical History  Diagnosis Date  . No pertinent past medical history   . Hypertension   . Asthma    Past Surgical History  Procedure Laterality Date  . Cholecystectomy    . Tonsillectomy    . Nasal septum surgery     Family History  Problem Relation Age of Onset  . Anesthesia problems Neg Hx    History  Substance Use Topics  . Smoking status: Light Tobacco Smoker  . Smokeless tobacco: Never Used     Comment: occasional  . Alcohol Use: No     Comment: occasional   OB History   Grav Para Term Preterm Abortions TAB SAB Ect Mult Living   2 2 2  0 0 0 0 0 0 2     Review of Systems  The patient denies anorexia, fever, weight loss,, vision loss, decreased hearing, hoarseness, chest pain, syncope,  peripheral edema, balance deficits, hemoptysis, abdominal pain, melena, hematochezia, severe indigestion/heartburn, hematuria, incontinence, genital sores, muscle weakness, suspicious skin lesions, transient blindness, difficulty walking, depression, unusual weight change, abnormal bleeding, enlarged lymph  nodes, angioedema, and breast masses.   Allergies  Fish allergy  Home Medications   Current Outpatient Rx  Name  Route  Sig  Dispense  Refill  . albuterol (PROVENTIL HFA;VENTOLIN HFA) 108 (90 BASE) MCG/ACT inhaler   Inhalation   Inhale 2 puffs into the lungs every 6 (six) hours as needed for wheezing or shortness of breath.   1 Inhaler   5   . levonorgestrel (MIRENA) 20 MCG/24HR IUD   Intrauterine   1 each by Intrauterine route continuous.          . predniSONE (DELTASONE) 10 MG tablet   Oral   Take 2 tablets (20 mg total) by mouth daily.   21 tablet   0     Prednisone dose pack directions:   6 tabs on day ...    BP 140/102  Pulse 81  Temp(Src) 97.9 F (36.6 C) (Oral)  Resp 25  SpO2 98% Physical Exam  Nursing note and vitals reviewed. Constitutional: She appears well-developed and well-nourished. No distress.  HENT:  Head: Normocephalic and atraumatic.  Eyes: Pupils are equal, round, and reactive to light.  Neck: Normal range of motion. Neck supple.  Cardiovascular: Normal rate and regular rhythm.   Pulmonary/Chest: Effort normal. No accessory muscle usage. No respiratory distress. She has decreased breath sounds (diffuse). She has no wheezes. She has no rhonchi. She has no rales.  Abdominal: Soft.  Neurological: She is alert.  Skin: Skin is warm and dry.  ED Course  Procedures (including critical care time) Labs Review Labs Reviewed - No data to display Imaging Review Dg Chest 2 View  09/04/2013   CLINICAL DATA:  Asthma exacerbation.  Right upper chest pain.  EXAM: CHEST  2 VIEW  COMPARISON:  DG CHEST 2 VIEW dated 11/13/2011  FINDINGS: The heart size and mediastinal contours are within normal limits. Both lungs are clear. The visualized skeletal structures are unremarkable.  IMPRESSION: No active cardiopulmonary disease.   Electronically Signed   By: Charlett NoseKevin  Dover M.D.   On: 09/04/2013 18:41     EKG Interpretation None      MDM   Final diagnoses:   Asthma exacerbation    After two rounds of Nebulizer treatments she is feeling much better. Pain medication and steroids given as well. Pt wants to restart taking her Advair but i recommend she see her PCP for that. Will start on oral steroid taper.  Chest xray normal.  23 y.o.Catherine Neptuneaynez M Giel's evaluation in the Emergency Department is complete. It has been determined that no acute conditions requiring further emergency intervention are present at this time. The patient/guardian have been advised of the diagnosis and plan. We have discussed signs and symptoms that warrant return to the ED, such as changes or worsening in symptoms.  Vital signs are stable at discharge. Filed Vitals:   09/04/13 1744  BP: 140/102  Pulse: 81  Temp: 97.9 F (36.6 C)  Resp: 25    Patient/guardian has voiced understanding and agreed to follow-up with the PCP or specialist.    Dorthula Matasiffany G Baley Lorimer, PA-C 09/04/13 1927

## 2013-09-04 NOTE — Discharge Instructions (Signed)
Asma - Adultos   (Asthma, Adult)   El asma es una enfermedad que afecta los pulmones. Se caracteriza por la inflamación y estrechamiento de las vías respiratorias, así como aumento de la producción mucosa. La causa del estrechamiento es el edema y los espasmos musculares que se producen dentro de los conductos por los que pasa el aire. Cuando esto sucede, la respiración puede ser difícil y puede tener tos, sibilancias y falta de aire. Aprender más sobre su enfermedad le ayudará a manejarla mejor. El asma no puede curarse, pero los medicamentos y los cambios en el estilo de vida lo ayudarán a controlarla. Puede ser un problema menor para algunas personas, pero si no se controla puede conducir a un ataque potencialmente mortal. El asma puede cambiar con el tiempo. Es importante trabajar con su médico para controlar sus síntomas.  CAUSAS   La causa exacta es desconocida. Se cree que la causa son factores heredados (genéticosy la exposición a factores ambientales. En el asma hay irritación e hinchazón (inflamación) de las vías respiratorias. Puede originarse debido a alergias, infecciones virales del pulmón o irritantes presentes en el aire. Las reacciones alérgicas pueden causar sibilancias inmediatamente o varias horas después de una exposición. Los desencadenantes del asma son diferentes para cada persona. Es importante prestar atención y saber qué lo desencadena.   Algunos desencadenantes comunes para los ataques de asma son:   · La caspa que eliminan los animales de la piel, el pelo o las plumas de los animales.  · Los ácaros del polvo que se encuentran en el polvo de la casa.  · Cucarachas.  · El pólen de los árboles o el césped.  · El moho.  · El humo del cigarrillo o del tabaco. NO DEBE PERMITIRSE QUE SE FUME EN LOS HOGARES DE PERSONAS CON ASMA. Las personas asmáticas no deben fumar y no deben estar cerca de fumadores.  · Sustancias contaminantes como el polvo, limpiadores hogareños, sprays para el cabello,  aerosoles, vapores de pintura, sustancias químicas fuertes u olores intensos.  · El aire frío o los cambios climáticos. El aire frío puede causar inflamación. El viento aumenta la cantidad de moho y polen del aire. No hay ningún clima particular que sea el mejor para un asmático.  · Emociones fuertes, como llorar o reir intensamente.  · Estrés.  · Ciertos medicamentos como la aspirina o los betabloqueantes.  · Los sulfitos que se encuentran en medicamentos y bebidas como frutas desecadas y el vino.  · Enfermedades infecciosas o inflamatorias como la gripe, el resfrío o una inflamación de las membranas nasales (rinitis).  · Reflujo gastroesofágico (ERGE). El ERGE es una enfermedad del estómago en el que los ácidos vuelven a la garganta (esófago).  · Ejercicios o actividad extenuante. Algunos medicamentos administrados antes del ejercicio permiten que la mayoría de las personas puedan participar en los deportes.  SÍNTOMAS   · Falta de aire.  · Opresión o dolor en el pecho.  · Dificultad para dormir debido a la tos, sibilancias o falta de aire.  · Un silbido o sibilancias con la espiración.  · Tos o sibilancias que empeoran cuando usted:  · Tiene un virus (como un resfriado o gripe).  · Sufre de alergias.  · Está expuesto a ciertos vapores o sustancias químicas.  · La práctica de ejercicios.  Los signos de que su asma está empeorando son:   · Signos y síntomas de asma más frecuentes y molestos.  · Aumenta la dificultad para respirar. Esto puede   medirse con un medidor de flujo máximo, que es un dispositivo simple que se usa para comprobar como están funcionando los pulmones.  · La necesidad cada vez más frecuente de utilizar un inhalador de alivio rápido.  DIAGNÓSTICO   El diagnóstico se realiza revisando su historia clínica a través de un examen físico y con algunos estudios. Algunos estudios de la función pulmonar pueden ayudar con el diagnóstico.   TRATAMIENTO   El asma no se cura. Sin embargo, en la mayoría de los  adultos puede controlarse con tratamiento. Además de evitar los desencadenantes, será necesario que use medicamentos. Hay 2 tipos de medicamento que se utilizan en el tratamiento para el asma: los medicamentos controladores (reducen la inflamación y los síntomas) y los medicamentos de alivio o de rescate ( alivian los síntomas durante los ataques agudos). Es posible que necesite medicamentos todos los días para controlar el asma. Los medicamentos controladores más eficaces para el asma son los corticoides inhalados (reducen la inflamación). Otros medicamentos de control a largo plazo incluyen:   · Antagonistas de los receptores de leucotrienos (bloquea una vía de inflamación).  · Beta2-agonistas de acción prolongada (relajan los músculos de las vías respiratorias durante al menos 12 horas) con un corticoides inhalado.  · El cromoglicato o nedocromil sódico (modifica la capacidad de ciertas células inflamatorias para liberar los productos químicos que causa inflamación).  · Los inmunomoduladores (alteran el sistema inmunológico para prevenir los síntomas del asma).  · La teofilina (relaja los músculos de las vías respiratorias).  Puede ser posible que necesite beta2-agonistas de corta acción para aliviar los síntomas del asma durante un ataque agudo.   Usted debe saber qué hacer durante un ataque agudo. Los inhalantes son eficaces cuando se utilizan adecuadamente. Lea las instrucciones para saber como usar los medicamentos correctamente, y hable con su médico si tiene dudas. Concurra a los controles regularmente para asegurarse que el asma está bien controlado. Si no está bien controlado, si ha sido hospitalizado por asma o si se necesitan múltiples medicamentos o dosis medias a altas de corticoides inhalados para controlarlo, pida que lo deriven a un especialista en asma.   INSTRUCCIONES PARA EL CUIDADO EN EL HOGAR   · Tome todos los medicamentos según le indicó su médico.  · Controle el ambiente hogareño en los  siguientes modos para prevenir los ataques de asma:  · Cambie el filtro de la calefacción y del aire acondicionado al menos una vez al mes.  · Coloque un filtro o estopa sobre la ventilación de la calefacción o del aire acondicionado.  · Limite el uso de hogares o estufas a leña.  · No fume. No permanezca en lugares en los que otras personas fuman.  · Elimine las plagas (cucarachas, ratones) y sus excrementos.  · Si observa moho en una planta, elimínela.  · Limpie los pisos y elimine el polvo una vez por semana. Utilice productos sin perfume. Utilice una aspiradora con filtros HEPA, siempre que le sea posible. Si la aspiradora o las tareas de limpieza son los desencadenantes de su asma, trate de encontrar a otra persona para hacer estas tareas.  · Los pisos de su casa deben ser de madera, baldosas o vinilo. Las alfombras pueden retener la caspa y el polvo.  · Use almohadas, mantas y cubre colchones antialérgicos.  · Lave las sábanas y mantas todas las semanas con agua caliente, y séquelas con calor.  · Use mantas de poliéster o algodón de tejido apretado.  · No use cobertores   sobre la cama.  · Limpie el baño y la cocina con lavandina y píntelos con pintura antihongos.  · Lávese las manos con frecuencia.  · Converse con el médico acerca del plan de acción para controlar los ataques de asma. Incluye el uso de un medidor de flujo, que mide la gravedad del ataque, y medicamentos que ayuden a detener el ataque. Un plan de acción que lo ayude a minimizar o detener los ataques sin necesidad de solicitar atención médica.  · Mantenga la calma durante el ataque.  · Cuente siempre con un plan para solicitar atención médica. Debe incluir la forma de contactar a su médico y, en caso de un ataque grave, comunicarse con el servicio de emergencias de su localidad (911 en los [place]).  SOLICITE ATENCIÓN MÉDICA SI:   · Respira con dificultad aún cuando toma la medicina para prevenir los ataques.  · Elimina flema cada vez más  espesa.  · Su esputo cambia de un color claro o blanco a un color amarillo, verde, gris o sanguinolento.  · Tiene trastornos ocasionados por la medicina que está tomando (como urticaria, picazón, hinchazón o dificultades respiratorias).  · Necesita un medicamento aliviador más de 2 o 3 veces por semana.  · Su flujo máximo aún está en 50-79% del mejor valor personal después de seguir el plan de acción durante 1 hora.  SOLICITE ATENCIÓN MÉDICA DE INMEDIATO SI:   · Le falta el aire, incluso en reposo.  · Le falta el aire aún cuando hace muy poca actividad física.  · Tiene dificultad para comer, beber o hablar debido a los síntomas del asma.  · Siente dolor en el pecho o el corazón palpita muy rápido.  · Tiene los labios o las uñas de tono azulado.  · Usted se siente mareado, débil o se desmaya.  · Tiene fiebre o síntomas que persisten durante más de 2 o 3 días.  · Tiene fiebre y los síntomas empeoran de manera súbita.  · Usted parece empeorar y no responde al tratamiento durante un ataque de asma.  · Su flujo máximo es menor del 50% del mejor valor personal.  ASEGÚRESE DE QUE:   · Comprende estas instrucciones.  · Controlará su enfermedad.  · Solicitará ayuda de inmediato si no mejora o si empeora.  Document Released: 05/27/2005 Document Revised: 05/13/2012  [employer]® Patient Information ©2014 [employer].

## 2013-09-04 NOTE — ED Provider Notes (Signed)
Medical screening examination/treatment/procedure(s) were performed by non-physician practitioner and as supervising physician I was immediately available for consultation/collaboration.    Celene KrasJon R Audrielle Vankuren, MD 09/04/13 [phone]

## 2013-09-04 NOTE — ED Notes (Signed)
Pt states she began to have cough and sob last night. States she used her inhaler 10x without relief. Pt has expiratory wheezing throughout and speaking in short sentences.

## 2013-09-20 ENCOUNTER — Ambulatory Visit: Payer: Self-pay | Admitting: Internal Medicine

## 2013-10-06 ENCOUNTER — Encounter (HOSPITAL_COMMUNITY): Payer: Self-pay | Admitting: Emergency Medicine

## 2013-10-06 ENCOUNTER — Emergency Department (HOSPITAL_COMMUNITY): Payer: Self-pay

## 2013-10-06 ENCOUNTER — Inpatient Hospital Stay (HOSPITAL_COMMUNITY)
Admission: EM | Admit: 2013-10-06 | Discharge: 2013-10-09 | DRG: 202 | Disposition: A | Discharge status: Home / Self Care | Payer: Self-pay | Attending: Internal Medicine | Admitting: Internal Medicine

## 2013-10-06 DIAGNOSIS — E874 Mixed disorder of acid-base balance: Secondary | ICD-10-CM | POA: Diagnosis present

## 2013-10-06 DIAGNOSIS — R05 Cough: Secondary | ICD-10-CM | POA: Diagnosis present

## 2013-10-06 DIAGNOSIS — E876 Hypokalemia: Secondary | ICD-10-CM

## 2013-10-06 DIAGNOSIS — D72829 Elevated white blood cell count, unspecified: Secondary | ICD-10-CM

## 2013-10-06 DIAGNOSIS — I10 Essential (primary) hypertension: Secondary | ICD-10-CM

## 2013-10-06 DIAGNOSIS — E872 Acidosis, unspecified: Secondary | ICD-10-CM

## 2013-10-06 DIAGNOSIS — E669 Obesity, unspecified: Secondary | ICD-10-CM | POA: Diagnosis present

## 2013-10-06 DIAGNOSIS — J45901 Unspecified asthma with (acute) exacerbation: Principal | ICD-10-CM

## 2013-10-06 DIAGNOSIS — R059 Cough, unspecified: Secondary | ICD-10-CM | POA: Diagnosis present

## 2013-10-06 DIAGNOSIS — F17200 Nicotine dependence, unspecified, uncomplicated: Secondary | ICD-10-CM | POA: Diagnosis present

## 2013-10-06 DIAGNOSIS — Z6834 Body mass index (BMI) 34.0-34.9, adult: Secondary | ICD-10-CM

## 2013-10-06 HISTORY — DX: Migraine, unspecified, not intractable, without status migrainosus: G43.909

## 2013-10-06 HISTORY — DX: Unspecified osteoarthritis, unspecified site: M19.90

## 2013-10-06 LAB — URINALYSIS, ROUTINE W REFLEX MICROSCOPIC
Bilirubin Urine: NEGATIVE
Bilirubin Urine: NEGATIVE
GLUCOSE, UA: NEGATIVE mg/dL
Glucose, UA: NEGATIVE mg/dL
Hgb urine dipstick: NEGATIVE
Hgb urine dipstick: NEGATIVE
Ketones, ur: NEGATIVE mg/dL
Ketones, ur: 15 mg/dL — AB
Leukocytes, UA: NEGATIVE
Leukocytes, UA: NEGATIVE
Nitrite: NEGATIVE
Nitrite: NEGATIVE
PROTEIN: NEGATIVE mg/dL
Protein, ur: NEGATIVE mg/dL
Specific Gravity, Urine: 1.009 (ref 1.005–1.030)
Specific Gravity, Urine: 1.018 (ref 1.005–1.030)
Urobilinogen, UA: 0.2 mg/dL (ref 0.0–1.0)
Urobilinogen, UA: 0.2 mg/dL (ref 0.0–1.0)
pH: 6 (ref 5.0–8.0)
pH: 5 (ref 5.0–8.0)

## 2013-10-06 LAB — BASIC METABOLIC PANEL
BUN: 7 mg/dL (ref 6–23)
CALCIUM: 9.5 mg/dL (ref 8.4–10.5)
CO2: 16 meq/L — AB (ref 19–32)
Chloride: 96 mEq/L (ref 96–112)
Creatinine, Ser: 0.53 mg/dL (ref 0.50–1.10)
GFR calc Af Amer: >90 mL/min (ref >90)
GFR calc non Af Amer: >90 mL/min (ref >90)
GLUCOSE: 129 mg/dL — AB (ref 70–99)
Potassium: 3.5 mEq/L — ABNORMAL LOW (ref 3.7–5.3)
SODIUM: 135 meq/L — AB (ref 137–147)

## 2013-10-06 LAB — RAPID URINE DRUG SCREEN, HOSP PERFORMED
Amphetamines: NOT DETECTED
Amphetamines: NOT DETECTED
Barbiturates: NOT DETECTED
Barbiturates: NOT DETECTED
Benzodiazepines: NOT DETECTED
Benzodiazepines: NOT DETECTED
Cocaine: NOT DETECTED
Cocaine: NOT DETECTED
Opiates: NOT DETECTED
Opiates: NOT DETECTED
Tetrahydrocannabinol: NOT DETECTED
Tetrahydrocannabinol: NOT DETECTED

## 2013-10-06 LAB — CBC WITH DIFFERENTIAL/PLATELET
Basophils Absolute: 0 10*3/uL (ref 0.0–0.1)
Basophils Relative: 0 % (ref 0–1)
Eosinophils Absolute: 0.1 10*3/uL (ref 0.0–0.7)
Eosinophils Relative: 0 % (ref 0–5)
HCT: 44.5 % (ref 36.0–46.0)
Hemoglobin: 15.5 g/dL — ABNORMAL HIGH (ref 12.0–15.0)
LYMPHS ABS: 1.2 10*3/uL (ref 0.7–4.0)
Lymphocytes Relative: 7 % — ABNORMAL LOW (ref 12–46)
MCH: 31.3 pg (ref 26.0–34.0)
MCHC: 34.8 g/dL (ref 30.0–36.0)
MCV: 89.9 fL (ref 78.0–100.0)
Monocytes Absolute: 0.3 10*3/uL (ref 0.1–1.0)
Monocytes Relative: 2 % — ABNORMAL LOW (ref 3–12)
Neutro Abs: 14.8 10*3/uL — ABNORMAL HIGH (ref 1.7–7.7)
Neutrophils Relative %: 91 % — ABNORMAL HIGH (ref 43–77)
Platelets: 250 10*3/uL (ref 150–400)
RBC: 4.95 MIL/uL (ref 3.87–5.11)
RDW: 13.1 % (ref 11.5–15.5)
WBC: 16.4 10*3/uL — ABNORMAL HIGH (ref 4.0–10.5)

## 2013-10-06 LAB — CBC
HCT: 44 % (ref 36.0–46.0)
Hemoglobin: 15 g/dL (ref 12.0–15.0)
MCH: 31 pg (ref 26.0–34.0)
MCHC: 34.1 g/dL (ref 30.0–36.0)
MCV: 90.9 fL (ref 78.0–100.0)
Platelets: 258 10*3/uL (ref 150–400)
RBC: 4.84 MIL/uL (ref 3.87–5.11)
RDW: 13.1 % (ref 11.5–15.5)
WBC: 17.3 10*3/uL — ABNORMAL HIGH (ref 4.0–10.5)

## 2013-10-06 LAB — I-STAT CHEM 8, ED
BUN: 6 mg/dL (ref 6–23)
Calcium, Ion: 1.2 mmol/L (ref 1.12–1.23)
Chloride: 98 mEq/L (ref 96–112)
Creatinine, Ser: 0.7 mg/dL (ref 0.50–1.10)
Glucose, Bld: 143 mg/dL — ABNORMAL HIGH (ref 70–99)
HCT: 49 % — ABNORMAL HIGH (ref 36.0–46.0)
Hemoglobin: 16.7 g/dL — ABNORMAL HIGH (ref 12.0–15.0)
Potassium: 3.4 mEq/L — ABNORMAL LOW (ref 3.7–5.3)
Sodium: 140 mEq/L (ref 137–147)
TCO2: 22 mmol/L (ref 0–100)

## 2013-10-06 LAB — HIV ANTIBODY (ROUTINE TESTING W REFLEX): HIV 1&2 Ab, 4th Generation: NONREACTIVE

## 2013-10-06 LAB — MAGNESIUM: Magnesium: 1.9 mg/dL (ref 1.5–2.5)

## 2013-10-06 MED ORDER — IPRATROPIUM-ALBUTEROL 0.5-2.5 (3) MG/3ML IN SOLN
3.0000 mL | Freq: Four times a day (QID) | RESPIRATORY_TRACT | Status: DC
  Administered 2013-10-07 (×4): 3 mL via RESPIRATORY_TRACT
  Filled 2013-10-06 (×4): qty 3

## 2013-10-06 MED ORDER — IPRATROPIUM BROMIDE 0.02 % IN SOLN
0.5000 mg | Freq: Once | RESPIRATORY_TRACT | Status: AC
  Administered 2013-10-06: 0.5 mg via RESPIRATORY_TRACT

## 2013-10-06 MED ORDER — ACETAMINOPHEN 650 MG RE SUPP: 650.0000 mg | Freq: Four times a day (QID) | RECTAL | Status: DC | PRN

## 2013-10-06 MED ORDER — PREDNISONE 20 MG PO TABS
60.0000 mg | ORAL_TABLET | Freq: Once | ORAL | Status: AC
  Administered 2013-10-06: 60 mg via ORAL
  Filled 2013-10-06: qty 3

## 2013-10-06 MED ORDER — ALBUTEROL SULFATE HFA 108 (90 BASE) MCG/ACT IN AERS
2.0000 | INHALATION_SPRAY | RESPIRATORY_TRACT | Status: DC | PRN
  Administered 2013-10-06: 2 via RESPIRATORY_TRACT
  Filled 2013-10-06: qty 6.7

## 2013-10-06 MED ORDER — IPRATROPIUM BROMIDE 0.02 % IN SOLN
RESPIRATORY_TRACT | Status: AC
  Filled 2013-10-06: qty 2.5

## 2013-10-06 MED ORDER — ALBUTEROL SULFATE (2.5 MG/3ML) 0.083% IN NEBU
2.5000 mg | INHALATION_SOLUTION | Freq: Once | RESPIRATORY_TRACT | Status: AC
  Administered 2013-10-06: 2.5 mg via RESPIRATORY_TRACT

## 2013-10-06 MED ORDER — SODIUM CHLORIDE 0.9 % IJ SOLN
3.0000 mL | Freq: Two times a day (BID) | INTRAMUSCULAR | Status: DC
  Administered 2013-10-06 – 2013-10-08 (×4): 3 mL via INTRAVENOUS

## 2013-10-06 MED ORDER — ACETAMINOPHEN 325 MG PO TABS: 650.0000 mg | ORAL_TABLET | Freq: Four times a day (QID) | ORAL | Status: DC | PRN

## 2013-10-06 MED ORDER — ALBUTEROL SULFATE (2.5 MG/3ML) 0.083% IN NEBU
5.0000 mg | INHALATION_SOLUTION | Freq: Once | RESPIRATORY_TRACT | Status: AC
  Administered 2013-10-06: 2.5 mg via RESPIRATORY_TRACT

## 2013-10-06 MED ORDER — HYDROCODONE-HOMATROPINE 5-1.5 MG/5ML PO SYRP
5.0000 mL | ORAL_SOLUTION | ORAL | Status: DC | PRN
Start: 2013-10-06 — End: 2013-10-09
  Administered 2013-10-06 – 2013-10-07 (×4): 5 mL via ORAL
  Filled 2013-10-06 (×4): qty 5

## 2013-10-06 MED ORDER — DM-GUAIFENESIN ER 30-600 MG PO TB12
1.0000 | ORAL_TABLET | Freq: Two times a day (BID) | ORAL | Status: DC
  Administered 2013-10-06 – 2013-10-09 (×6): 1 via ORAL
  Filled 2013-10-06 (×9): qty 1

## 2013-10-06 MED ORDER — DIPHENHYDRAMINE HCL 25 MG PO CAPS
25.0000 mg | ORAL_CAPSULE | Freq: Every evening | ORAL | Status: DC | PRN
  Administered 2013-10-06: 25 mg via ORAL
  Filled 2013-10-06: qty 1

## 2013-10-06 MED ORDER — IPRATROPIUM-ALBUTEROL 0.5-2.5 (3) MG/3ML IN SOLN
3.0000 mL | RESPIRATORY_TRACT | Status: DC
  Administered 2013-10-06 (×2): 3 mL via RESPIRATORY_TRACT
  Filled 2013-10-06 (×2): qty 3

## 2013-10-06 MED ORDER — ALBUTEROL SULFATE (2.5 MG/3ML) 0.083% IN NEBU
INHALATION_SOLUTION | RESPIRATORY_TRACT | Status: AC
  Administered 2013-10-06: 11:00:00 2.5 mg via RESPIRATORY_TRACT
  Filled 2013-10-06: qty 6

## 2013-10-06 MED ORDER — ALBUTEROL SULFATE (2.5 MG/3ML) 0.083% IN NEBU
2.5000 mg | INHALATION_SOLUTION | RESPIRATORY_TRACT | Status: DC
  Administered 2013-10-06: 2.5 mg via RESPIRATORY_TRACT
  Filled 2013-10-06: qty 3

## 2013-10-06 MED ORDER — METHYLPREDNISOLONE SODIUM SUCC 125 MG IJ SOLR
80.0000 mg | Freq: Two times a day (BID) | INTRAMUSCULAR | Status: DC
  Administered 2013-10-06 – 2013-10-07 (×2): 80 mg via INTRAVENOUS
  Filled 2013-10-06 (×3): qty 1.28

## 2013-10-06 MED ORDER — ENOXAPARIN SODIUM 40 MG/0.4ML ~~LOC~~ SOLN
40.0000 mg | SUBCUTANEOUS | Status: DC
  Administered 2013-10-06 – 2013-10-08 (×3): 40 mg via SUBCUTANEOUS
  Filled 2013-10-06 (×4): qty 0.4

## 2013-10-06 MED ORDER — POTASSIUM CHLORIDE CRYS ER 20 MEQ PO TBCR
40.0000 meq | EXTENDED_RELEASE_TABLET | ORAL | Status: AC
  Administered 2013-10-06 (×2): 40 meq via ORAL
  Filled 2013-10-06 (×3): qty 2

## 2013-10-06 MED ORDER — IPRATROPIUM-ALBUTEROL 0.5-2.5 (3) MG/3ML IN SOLN
3.0000 mL | RESPIRATORY_TRACT | Status: DC | PRN
  Administered 2013-10-08: 3 mL via RESPIRATORY_TRACT
  Filled 2013-10-06 (×2): qty 3

## 2013-10-06 MED ORDER — IBUPROFEN 800 MG PO TABS
800.0000 mg | ORAL_TABLET | Freq: Once | ORAL | Status: AC
  Administered 2013-10-06: 800 mg via ORAL
  Filled 2013-10-06: qty 1

## 2013-10-06 MED ORDER — ALBUTEROL (5 MG/ML) CONTINUOUS INHALATION SOLN
10.0000 mg | INHALATION_SOLUTION | RESPIRATORY_TRACT | Status: DC
Start: 2013-10-06 — End: 2013-10-06
  Administered 2013-10-06: 10 mg via RESPIRATORY_TRACT
  Filled 2013-10-06: qty 20

## 2013-10-06 NOTE — ED Notes (Signed)
PA aware that pt is requesting something for pain. States that her chest is sore from coughing.

## 2013-10-06 NOTE — Progress Notes (Signed)
Patient trasfered from [facility] to [phone] via stretcher; alert and oriented x 4; no complaints of pain; IV saline locked in LAC; skin intact. Orient patient to room and unit; watch safety video; gave patient care guide; instructed how to use the call bell and  fall risk precautions. Will continue to monitor the patient.

## 2013-10-06 NOTE — ED Provider Notes (Signed)
CSN: [id number]     Arrival date & time 10/06/13  1019 History   First MD Initiated Contact with Patient 10/06/13 1023     Chief Complaint  Patient presents with  . Asthma     (Consider location/radiation/quality/duration/timing/severity/associated sxs/prior Treatment) The history is provided by the patient. No language interpreter was used.   Pt is an obese 23yo female with hx of asthma and HTN presenting with SOB, c/o gradually worsening SOB and wheezing, onset 3 days ago, worse today. States she has used her albuterol inhaler daily for last 3 days. Denies any other asthma medications. States she was initially on advair but a pulmonologist took her off since she did not have "wheezing" when she was seen.  Concerned her albuterol inhaler is not helping. Reports previous ER visits for asthma and previously prescribed prednisone for asthma exacerbation, denies needed to be admitted for asthma, states she usually gets to go home after tx in ER.  Denies fever, n/v/d. Denies chest pain. Denies sick contacts or recent travel. Denies hx of blood clots, leg pain or swelling.   Past Medical History  Diagnosis Date  . No pertinent past medical history   . Preeclampsia   . Asthma    Past Surgical History  Procedure Laterality Date  . Cholecystectomy  2014  . Tonsillectomy    . Nasal septum surgery     Family History  Problem Relation Age of Onset  . Anesthesia problems Neg Hx    History  Substance Use Topics  . Smoking status: Light Tobacco Smoker  . Smokeless tobacco: Never Used     Comment: occasional, last one 1 month  . Alcohol Use: Yes     Comment: occasional   OB History   Grav Para Term Preterm Abortions TAB SAB Ect Mult Living   2 2 2  0 0 0 0 0 0 2     Obstetric Comments   2 children, one boy and girl     Review of Systems  Constitutional: Negative for fever and chills.  HENT: Negative for congestion.   Respiratory: Positive for cough, chest tightness, shortness of  breath and wheezing. Negative for stridor.   Cardiovascular: Negative for chest pain.  Gastrointestinal: Negative for nausea, vomiting, abdominal pain and diarrhea.  All other systems reviewed and are negative.     Allergies  Fish allergy  Home Medications   Prior to Admission medications   Medication Sig Start Date End Date Taking? Authorizing Provider  albuterol (PROVENTIL HFA;VENTOLIN HFA) 108 (90 BASE) MCG/ACT inhaler Inhale 2 puffs into the lungs every 6 (six) hours as needed for wheezing or shortness of breath. 05/20/13   Ripudeep Jenna LuoK Rai, MD  levonorgestrel (MIRENA) 20 MCG/24HR IUD 1 each by Intrauterine route continuous.     Historical Provider, MD  predniSONE (DELTASONE) 10 MG tablet Take 2 tablets (20 mg total) by mouth daily. 09/04/13   Tiffany Irine SealG Greene, PA-C   BP 137/74  Pulse 102  Temp(Src) 98.4 F (36.9 C) (Oral)  Resp 25  Ht 5\' 4"  (1.626 m)  Wt 201 lb (91.173 kg)  BMI 34.48 kg/m2  SpO2 93% Physical Exam  Nursing note and vitals reviewed. Constitutional: She appears well-developed and well-nourished.  Pt sitting up in exam bed, non-rebreather in place, appears mild to moderate SOB  HENT:  Head: Normocephalic and atraumatic.  Eyes: Conjunctivae are normal. No scleral icterus.  Neck: Normal range of motion.  Cardiovascular: Normal rate, regular rhythm and normal heart sounds.   Pulmonary/Chest:  She is in respiratory distress ( pt speaking in short phrases). She has wheezes. She has no rales. She exhibits no tenderness.  Diffuse inspiratory and expiratory wheeze. Speaking in 3-4 work sentences.   Abdominal: Soft. Bowel sounds are normal. She exhibits no distension and no mass. There is no tenderness. There is no rebound and no guarding.  Musculoskeletal: Normal range of motion.  Neurological: She is alert.  Skin: Skin is warm.    ED Course  Procedures (including critical care time) Labs Review Labs Reviewed  CBC - Abnormal; Notable for the following:    WBC  17.3 (*)    All other components within normal limits  I-STAT CHEM 8, ED - Abnormal; Notable for the following:    Potassium 3.4 (*)    Glucose, Bld 143 (*)    Hemoglobin 16.7 (*)    HCT 49.0 (*)    All other components within normal limits  BASIC METABOLIC PANEL  CBC WITH DIFFERENTIAL  URINALYSIS, ROUTINE W REFLEX MICROSCOPIC  URINE RAPID DRUG SCREEN (HOSP PERFORMED)  HIV ANTIBODY (ROUTINE TESTING)    Imaging Review Dg Chest 2 View  10/06/2013   CLINICAL DATA:  Cough, shortness of breath  EXAM: CHEST  2 VIEW  COMPARISON:  09/04/2013  FINDINGS: The heart size and mediastinal contours are within normal limits. Both lungs are clear. The visualized skeletal structures are unremarkable.  IMPRESSION: No active cardiopulmonary disease.   Electronically Signed   By: Elige KoHetal  Patel   On: 10/06/2013 11:13     EKG Interpretation None      MDM   Final diagnoses:  Asthma exacerbation    Pt with hx of asthma presenting with exacerbation w/o relief of albuterol inhaler x3 days.  Pt started on duoneb tx and given prednisone. Pt still has diffuse inspiratory and expiratory wheeze. CXR: no active cardiopulmonary disease.  Will start pt on continuous 1hr neb.    12:50 PM Pt states she feels better after 1hr neb, but states she also feels very jittery. Lungs: inspiratory and expiratory wheeze still present, however less severe.  Ambulatory O2 is 97%, Pulse-154, once pt returned to room HR-131.  Will allow pt to relax in ED to make sure HR continues to go down. Likely due to albuterol as pt was not tachycardic upon arrival.    Pt's HR was improving, plan was to discharge pt home, however, with ambulation, pt's O2 continued to go down, 91% on RA, Pulse-134. Discussed pt with Dr. Rubin PayorPickering who also examined pt, pt will need to be admitted to observation tele for further tx of asthma exacerbation.   2:54 PM Consulted with internal medicine who agreed to come see pt to admit her to tele bed. Pt stable at  this time.    Junius Finnerrin O'Malley, PA-C 10/06/13 1529

## 2013-10-06 NOTE — ED Notes (Signed)
Water given to patient by provider.

## 2013-10-06 NOTE — ED Notes (Signed)
PA at bedside.

## 2013-10-06 NOTE — ED Notes (Signed)
Onset 3 days ago refilled albuterol inhaler and since then states using inhaler constantly for wheezing and shortness of breath. Concerned not feeling any relief. Patient has audible expiratory wheezing upon arrival.  Airway intact bilateral equal chest rise and fall.

## 2013-10-06 NOTE — H&P (Signed)
Date: 10/06/2013               Patient Name:  Catherine Porter MRN: [id number]  DOB: 24-Jul-1990 Age / Sex: 23 y.o., female   PCP: Jeanann Lewandowsky, MD         Medical Service: Internal Medicine Teaching Service         Attending Physician: Dr. Jonah Blue, DO    First Contact: Dr. Carlynn Purl Pager: [phone]  Second Contact: Dr. Darden Palmer Pager: [phone]       After Hours (After 5p/  First Contact Pager: [phone]  weekends / holidays): Second Contact Pager: [phone]   Chief Complaint: SOB  History of Present Illness: Catherine Porter is a 23 yo Hispanic female with PMH of Adult onset Asthma, gestational hypertension.  She reports she started to have increased SOB on Friday (4 days PTA) associated with wheezing, she refilled her inhaler at that time and has been using it 5-8 times a day with little relief.  She reports that her SOB has gotten worse to the point that it was difficult to sleep last night.  She waiting to come to the ED until this morning as she has two young children to take care of and wanted to wait until her mother could be there to take care of them.  In the ED she was found to have diffuse wheezing.  She was treated with duonebs, prednisone, she had improvement in her SOB however was noted to have continued wheezing and tachycardia, IMTS was asked to admit for further treatment and observation.  Of note she reports she was diagnosed 2.5 years ago with Asthma, was previously well controlled with Advair and Albuterol PRN.  However advair was very expensive and her PCP discontinued it as she was doing well without it.  Since that time she has had 3 ED visit.  She has never been admitted for asthma, never been intubated.  Meds: Current Facility-Administered Medications  Medication Dose Route Frequency Provider Last Rate Last Dose  . albuterol (PROVENTIL) (2.5 MG/3ML) 0.083% nebulizer solution 2.5 mg  2.5 mg Nebulization Q4H Nathan R. Pickering, MD      . ipratropium  (ATROVENT) 0.02 % nebulizer solution            Current Outpatient Prescriptions  Medication Sig Dispense Refill  . albuterol (PROVENTIL HFA;VENTOLIN HFA) 108 (90 BASE) MCG/ACT inhaler Inhale 2 puffs into the lungs every 6 (six) hours as needed for wheezing or shortness of breath.  1 Inhaler  5  . levonorgestrel (MIRENA) 20 MCG/24HR IUD 1 each by Intrauterine route continuous.         Allergies: Allergies as of 10/06/2013 - Review Complete 10/06/2013  Allergen Reaction Noted  . Fish allergy Swelling 05/03/2012   Past Medical History  Diagnosis Date  . No pertinent past medical history   . Hypertension   . Asthma    Past Surgical History  Procedure Laterality Date  . Cholecystectomy    . Tonsillectomy    . Nasal septum surgery     Family History  Problem Relation Age of Onset  . Anesthesia problems Neg Hx    History   Social History  . Marital Status: Married    Spouse Name: N/A    Number of Children: N/A  . Years of Education: N/A   Occupational History  . Not on file.   Social History Main Topics  . Smoking status: Light Tobacco Smoker  . Smokeless tobacco: Never Used  Comment: occasional  . Alcohol Use: No     Comment: occasional  . Drug Use: No  . Sexual Activity: Yes    Birth Control/ Protection: None   Other Topics Concern  . Not on file   Social History Narrative  . No narrative on file    Review of Systems: Review of Systems  Constitutional: Negative for fever, chills, weight loss and malaise/fatigue.  HENT: Negative for congestion and sore throat.   Eyes: Negative for blurred vision.  Respiratory: Positive for cough, sputum production (mucus), shortness of breath and wheezing. Negative for hemoptysis.   Cardiovascular: Positive for chest pain (now resolved after duoneb treatment).  Gastrointestinal: Negative for heartburn, abdominal pain, diarrhea and constipation.  Genitourinary: Negative for dysuria and frequency.  Musculoskeletal:  Negative for myalgias.  Neurological: Negative for dizziness, focal weakness and headaches.  Psychiatric/Behavioral: Negative for substance abuse.  All other systems reviewed and are negative.    Physical Exam: Blood pressure 128/81, pulse 134, temperature 98.4 F (36.9 C), temperature source Oral, resp. rate 24, height 5\' 4"  (1.626 m), weight 201 lb (91.173 kg), SpO2 91.00%. Physical Exam  Nursing note and vitals reviewed. Constitutional: She is oriented to person, place, and time and well-developed, well-nourished, and in no distress. No distress.  Speaking in full sentences  HENT:  Head: Normocephalic and atraumatic.  Eyes: EOM are normal.  Neck: Normal range of motion. Neck supple.  Cardiovascular: Normal rate, regular rhythm, normal heart sounds and intact distal pulses.   No murmur heard. Pulmonary/Chest: Effort normal. No respiratory distress. She has wheezes (diffuse). She exhibits no tenderness.  Abdominal: Soft. Bowel sounds are normal. She exhibits no distension. There is no tenderness. There is no rebound.  Musculoskeletal: She exhibits edema (trace).  Neurological: She is alert and oriented to person, place, and time.  Skin: Skin is warm and dry.  Psychiatric: Mood, memory, affect and judgment normal.     Lab results: Basic Metabolic Panel:  Recent Labs  16/03/9603/29/15 1429  NA 140  K 3.4*  CL 98  GLUCOSE 143*  BUN 6  CREATININE 0.70   CBC:  Recent Labs  10/06/13 1412 10/06/13 1429  WBC 17.3*  --   HGB 15.0 16.7*  HCT 44.0 49.0*  MCV 90.9  --   PLT 258  --    Imaging results:  Dg Chest 2 View  10/06/2013   CLINICAL DATA:  Cough, shortness of breath  EXAM: CHEST  2 VIEW  COMPARISON:  09/04/2013  FINDINGS: The heart size and mediastinal contours are within normal limits. Both lungs are clear. The visualized skeletal structures are unremarkable.  IMPRESSION: No active cardiopulmonary disease.   Electronically Signed   By: Elige KoHetal  Patel   On: 10/06/2013 11:13     Assessment & Plan by Problem:   Asthma exacerbation -Admit to Telemetry (observation) - Solumedrol 80mg  IV BID -Duoneb Q4 scheduled and Q2 PRN - Hycodan for cough - Monitor peak flow - Consult Care management for medication affordability - Check ABG if worsens.  Anion Gap Metabolic Acidosis - Likely due to rapid correction of Respiratory Alkalosis (asthma hyperventilation) - Repeat BMP, Lactic Acid in AM  Hypokalemia - Replaced -Check Mag  Leukocytosis -WBC of 17.3, may be due to stress reaction. No signs of infection (CXR no infiltrate, U/A negative)  Dispo: Disposition is deferred at this time, awaiting improvement of current medical problems. Anticipated discharge in approximately 1 day(s).   The patient does have a current PCP ([name], MD) and  does not [facility] after discharge.  The patient does not have transportation limitations that hinder transportation to clinic appointments.  Signed: Carlynn PurlErik Taresa [place], DO 10/06/2013, 2:55 PM

## 2013-10-06 NOTE — ED Notes (Signed)
Patient ambulated around ED hallway steady gait. Heart rate 154 pulse oximetry 97% room air. Patient stated and visible tremors after respiratory treatment. Provider notified.

## 2013-10-07 LAB — CBC
HEMATOCRIT: 44.9 % (ref 36.0–46.0)
Hemoglobin: 15 g/dL (ref 12.0–15.0)
MCH: 30.5 pg (ref 26.0–34.0)
MCHC: 33.4 g/dL (ref 30.0–36.0)
MCV: 91.3 fL (ref 78.0–100.0)
PLATELETS: 272 10*3/uL (ref 150–400)
RBC: 4.92 MIL/uL (ref 3.87–5.11)
RDW: 13.3 % (ref 11.5–15.5)
WBC: 17.5 10*3/uL — AB (ref 4.0–10.5)

## 2013-10-07 LAB — LACTIC ACID, PLASMA: Lactic Acid, Venous: 1.3 mmol/L (ref 0.5–2.2)

## 2013-10-07 LAB — BASIC METABOLIC PANEL
BUN: 7 mg/dL (ref 6–23)
CO2: 20 mEq/L (ref 19–32)
CREATININE: 0.48 mg/dL — AB (ref 0.50–1.10)
Calcium: 9.7 mg/dL (ref 8.4–10.5)
Chloride: 101 mEq/L (ref 96–112)
GFR calc Af Amer: >90 mL/min (ref >90)
GLUCOSE: 130 mg/dL — AB (ref 70–99)
Potassium: 4.7 mEq/L (ref 3.7–5.3)
Sodium: 138 mEq/L (ref 137–147)

## 2013-10-07 LAB — GLUCOSE, CAPILLARY: Glucose-Capillary: 144 mg/dL — ABNORMAL HIGH (ref 70–99)

## 2013-10-07 MED ORDER — LORATADINE 10 MG PO TABS
10.0000 mg | ORAL_TABLET | Freq: Every day | ORAL | Status: DC
  Administered 2013-10-07 – 2013-10-09 (×3): 10 mg via ORAL
  Filled 2013-10-07 (×3): qty 1

## 2013-10-07 MED ORDER — PREDNISONE 50 MG PO TABS
60.0000 mg | ORAL_TABLET | Freq: Once | ORAL | Status: AC
  Administered 2013-10-08: 60 mg via ORAL
  Filled 2013-10-07: qty 1

## 2013-10-07 NOTE — H&P (Signed)
INTERNAL MEDICINE TEACHING SERVICE Attending Admission Note  Date: 10/07/2013  Patient name: Catherine PeriDaynez M Birkey  Medical record number: [id number]  Date of birth: 09/16/1990    I have seen and evaluated Catherine Porter and discussed their care with the Residency Team.  23 yr old woman with hx asthma, gestational hypertension, who presented with SOB. She admits to recent increased use of her albuterol inhaler. She states she was previously on Advair but this was stopped due to cost and subsequently she did well without it. She has no hx of intubations. She was noted to have a WBC of 17k on admission without recent steroid tx. CXR without infiltrate.  She was treated with scheduled duoneb treatments and IV solumedrol.  This morning, she feels slightly better. Denies productive cough. She feels better on O2 via [place] but has no evidence of hypoxia. On exam, she has minimal bilateral experitory wheeze. WBC is unchanged today.  At this time, may change to oral prednisone 60 mg daily and continue nebulizer tx with Duoneb. I don't suspect PNA. Her leukocytosis likely stress demargination. Monitor PEF's. Continue to monitor her, ambulate, and wean off O2.  Jonah BlueAlejandro Keishia Ground, DO, FACP [facility] 10/07/2013, 10:56 AM

## 2013-10-07 NOTE — Discharge Summary (Signed)
Name: Catherine PeriDaynez M Lookabaugh MRN: [id number] DOB: 08/27/1990 22 y.o. PCP: [name], MD  Date of Admission: 10/06/2013 10:20 AM Date of Discharge: 10/09/2013 Attending Physician: Earl LagosNischal Narendra, MD  Discharge Diagnosis: Principal Problem:   Asthma exacerbation  Discharge Medications:   Medication List         albuterol 108 (90 BASE) MCG/ACT inhaler  Commonly known as:  PROVENTIL HFA;VENTOLIN HFA  Inhale 1-2 puffs into the lungs every 6 (six) hours as needed for wheezing or shortness of breath.     dextromethorphan-guaiFENesin 30-600 MG per 12 hr tablet  Commonly known as:  MUCINEX DM  Take 1 tablet by mouth 2 (two) times daily.     Fluticasone-Salmeterol 250-50 MCG/DOSE Aepb  Commonly known as:  ADVAIR DISKUS  Inhale 1 puff into the lungs daily.     levonorgestrel 20 MCG/24HR IUD  Commonly known as:  MIRENA  1 each by Intrauterine route continuous.     loratadine 10 MG tablet  Commonly known as:  CLARITIN  Take 1 tablet (10 mg total) by mouth daily.     pantoprazole 40 MG tablet  Commonly known as:  PROTONIX  Take 1 tablet (40 mg total) by mouth daily.     predniSONE 20 MG tablet  Commonly known as:  DELTASONE  Take 3 tablets (60 mg total) by mouth once. Take 3 tabs for 3 days, 2 tablets for 3 days, then 1 tablet until finished        Disposition and follow-up:   Ms.Reilley Wilfrid LundM Lites [facility].  At the hospital follow up visit please address:  1.  Resolution of symptoms/ wheezing, ability to afford Advair as maintenance inhaler.  2.  Labs / imaging needed at time of follow-up: none  3.  Pending labs/ test needing follow-up: none  Follow-up Appointments: Follow-up Information   Follow up with Jeanann LewandowskyJEGEDE, OLUGBEMIGA, MD. (Call to schedule appointment for recheck of symptoms and ongoing treatment for asthma.  you likely need additional daily asthma medication.)    Specialty:  Internal Medicine   Contact  information:   [address] [place] [address] [phone]       Follow [facility] with [facility]. (Call to schedule appointment with pulmonologist for further evaluation and treatment of asthma.)    Specialty:  Pulmonology   Contact information:   [address] [place] [address] [phone]      [facility]. (Return to ER If symptoms worsen)    Specialty:  Emergency Medicine   Contact information:   [address] [id number] [place] [address] [phone]      [facility] with [facility]     On 10/22/2013. (11 am for hospital follow up and 12 for orange card application)    Contact information:   [address] [place] [address] [phone]      Discharge Instructions:  Future Appointments Provider Department Dept Phone   10/22/2013 11:00 AM Doris Cheadleeepak Advani, MD [facility] [phone]   10/22/2013 12:00 PM Chw-Chww Financial Counselor [facility] [phone]      Consultations:    Procedures Performed:  Dg Chest 2 View  10/06/2013   CLINICAL DATA:  Cough, shortness of breath  EXAM: CHEST  2 VIEW  COMPARISON:  09/04/2013  FINDINGS: The heart size and mediastinal contours are within normal limits.  Both lungs are clear. The visualized skeletal structures are unremarkable.  IMPRESSION: No active cardiopulmonary disease.   Electronically Signed   By: Elige KoHetal  Patel   On: 10/06/2013 11:13   Admission HPI: Catherine Porter is a 23 yo Hispanic female with PMH of Adult onset Asthma, gestational hypertension. She reports she started to have increased SOB on Friday (4 days PTA) associated with wheezing, she refilled her inhaler at that time and has been using it 5-8 times a day with little relief. She reports that her SOB has gotten worse to the point that it was difficult to sleep last night. She waiting  to come to the ED until this morning as she has two young children to take care of and wanted to wait until her mother could be there to take care of them. In the ED she was found to have diffuse wheezing. She was treated with duonebs, prednisone, she had improvement in her SOB however was noted to have continued wheezing and tachycardia, IMTS was asked to admit for further treatment and observation.  Of note she reports she was diagnosed 2.5 years ago with Asthma, was previously well controlled with Advair and Albuterol PRN. However advair was very expensive and her PCP discontinued it as she was doing well without it. Since that time she has had 3 ED visit. She has never been admitted for asthma, never been intubated.   Hospital Course by problem list:  Acute Asthma exacerbation  Patient presented to ED with SOB, found to have diffuse wheezing.  This was unable to be controlled by management in the ED and she was admitted.  She was given IV solumedrol and schedule albuterol and ipratropium nebulized treatments.  She was followed closely with peak expiratory flow measurements. She did have improvement in her symptoms by hospital day 3. She was discharged on a 2 week taper of oral prednisone.  She had noted increased ED visits and exacerbation since discontinuing Advair due to cost and getting a new pet (long haired dog).  She was prescribed Advair on discharge with albuterol. She was prescribed loratadine daily to help with possible allergy to dog/pollen no frank eosinophilia noted during admission and pt had never been on singular.  Discharge Vitals:   BP 114/77  Pulse 70  Temp(Src) 97.8 F (36.6 C) (Oral)  Resp 18  Ht 5\' 4"  (1.626 m)  Wt 200 lb 13.4 oz (91.1 kg)  BMI 34.46 kg/m2  SpO2 97% General: lying in bed, NAD, talking in full sentences  HEENT: [place]/AT, mucous membranes moist  Cardiac: RRR  Pulm: CTAB, pre-treatment peak flows in 200s  Abd: soft, nontender, nondistended, BS present  Ext:  warm and well perfused, no pedal edema  Neuro: AAO x 3  Discharge Labs:  Results for orders placed during the hospital encounter of 10/06/13 (from the past 24 hour(s))  GLUCOSE, CAPILLARY     Status: None   Collection Time    10/09/13  7:51 AM      Result Value Ref Range   Glucose-Capillary 93  70 - 99 mg/dL    Signed: Christen BameNora Hakim Minniefield, MD 10/09/2013, 10:31 AM   Time Spent on Discharge: >30 minutes Services Ordered on Discharge: none Equipment Ordered on Discharge: none

## 2013-10-07 NOTE — Progress Notes (Signed)
UR Completed.  Catherine Porter [name] [phone] 10/07/2013  

## 2013-10-07 NOTE — Progress Notes (Signed)
Subjective: Feeling a little better today, speaking in full sentences.  Still with cough Objective: Vital signs in last 24 hours: Filed Vitals:   10/06/13 2107 10/06/13 2240 10/07/13 0632 10/07/13 0634  BP:  130/82 126/84   Pulse:  104 86 81  Temp:  98.2 F (36.8 C) 98 F (36.7 C)   TempSrc:  Oral Oral   Resp:  22 20   Height:      Weight:   199 lb 1.2 oz (90.3 kg)   SpO2: 98% 94% 96%    Weight change:   Intake/Output Summary (Last 24 hours) at 10/07/13 0849 Last data filed at 10/06/13 2020  Gross per 24 hour  Intake      0 ml  Output    601 ml  Net   -601 ml   General: resting in bed, speaking in full sentences HEENT: PERRL, EOMI,  Cardiac: tachycardic Pulm: wheezing bilaterally, peak flow 125 Abd: soft, nontender, nondistended, BS present Ext: warm and well perfused, no pedal edema Neuro: alert and oriented X4 Lab Results: Basic Metabolic Panel:  Recent Labs Lab 10/06/13 1429 10/06/13 1457  NA 140 135*  K 3.4* 3.5*  CL 98 96  CO2  --  16*  GLUCOSE 143* 129*  BUN 6 7  CREATININE 0.70 0.53  CALCIUM  --  9.5  MG  --  1.9   Liver Function Tests: No results found for this basename: AST, ALT, ALKPHOS, BILITOT, PROT, ALBUMIN,  in the last 168 hours No results found for this basename: LIPASE, AMYLASE,  in the last 168 hours No results found for this basename: AMMONIA,  in the last 168 hours CBC:  Recent Labs Lab 10/06/13 1457 10/07/13 0705  WBC 16.4* 17.5*  NEUTROABS 14.8*  --   HGB 15.5* 15.0  HCT 44.5 44.9  MCV 89.9 91.3  PLT 250 272   Cardiac Enzymes: No results found for this basename: CKTOTAL, CKMB, CKMBINDEX, TROPONINI,  in the last 168 hours BNP: No results found for this basename: PROBNP,  in the last 168 hours D-Dimer: No results found for this basename: DDIMER,  in the last 168 hours CBG:  Recent Labs Lab 10/07/13 0743  GLUCAP 144*   Hemoglobin A1C: No results found for this basename: HGBA1C,  in the last 168 hours Fasting  Lipid Panel: No results found for this basename: CHOL, HDL, LDLCALC, TRIG, CHOLHDL, LDLDIRECT,  in the last 168 hours Thyroid Function Tests: No results found for this basename: TSH, T4TOTAL, FREET4, T3FREE, THYROIDAB,  in the last 168 hours Coagulation: No results found for this basename: LABPROT, INR,  in the last 168 hours Anemia Panel: No results found for this basename: VITAMINB12, FOLATE, FERRITIN, TIBC, IRON, RETICCTPCT,  in the last 168 hours Urine Drug Screen: Drugs of Abuse     Component Value Date/Time   LABOPIA NONE DETECTED 10/06/2013 1557   COCAINSCRNUR NONE DETECTED 10/06/2013 1557   LABBENZ NONE DETECTED 10/06/2013 1557   AMPHETMU NONE DETECTED 10/06/2013 1557   THCU NONE DETECTED 10/06/2013 1557   LABBARB NONE DETECTED 10/06/2013 1557    Alcohol Level: No results found for this basename: ETH,  in the last 168 hours Urinalysis:  Recent Labs Lab 10/06/13 1543 10/06/13 1557  COLORURINE YELLOW YELLOW  LABSPEC 1.009 1.018  PHURINE 6.0 5.0  GLUCOSEU NEGATIVE NEGATIVE  HGBUR NEGATIVE NEGATIVE  BILIRUBINUR NEGATIVE NEGATIVE  KETONESUR NEGATIVE 15*  PROTEINUR NEGATIVE NEGATIVE  UROBILINOGEN 0.2 0.2  NITRITE NEGATIVE NEGATIVE  LEUKOCYTESUR NEGATIVE NEGATIVE  Micro Results: No results found for this or any previous visit (from the past 240 hour(s)). Studies/Results: Dg Chest 2 View  10/06/2013   CLINICAL DATA:  Cough, shortness of breath  EXAM: CHEST  2 VIEW  COMPARISON:  09/04/2013  FINDINGS: The heart size and mediastinal contours are within normal limits. Both lungs are clear. The visualized skeletal structures are unremarkable.  IMPRESSION: No active cardiopulmonary disease.   Electronically Signed   By: Elige KoHetal  Patel   On: 10/06/2013 11:13   Medications: I have reviewed the patient's current medications. Scheduled Meds: . dextromethorphan-guaiFENesin  1 tablet Oral BID  . enoxaparin (LOVENOX) injection  40 mg Subcutaneous Q24H  . ipratropium-albuterol  3 mL  Nebulization QID  . methylPREDNISolone (SOLU-MEDROL) injection  80 mg Intravenous Q12H  . sodium chloride  3 mL Intravenous Q12H   Continuous Infusions:  PRN Meds:.acetaminophen, acetaminophen, diphenhydrAMINE, HYDROcodone-homatropine, ipratropium-albuterol Assessment/Plan:   Asthma exacerbation - Patient improving some, peak flow 125, expected ~400. -Patient received IV solumedrol today, will transition to prednisone 60mg  daily will likely need at least 2 week taper on discharge. -Continue Duonebs Q4 scheduled Q2PRN, continue peak flow measurements - Start lortadine - Hycodan for cough - Advair 250-50 on discharge, consider nebulizer- Appreciate Case management help with medication affordability  Leukocytosis -Likely stress reaction patient afebrile, now on steroids, no need to monitor.  Improving AG metabolic acidosis -Likely secondary to rapid correction of respiratory alkalosis. -Lactic acid normal - Repeat BMP in AM.  Dispo: Disposition is deferred at this time, awaiting improvement of current medical problems.  Anticipated discharge in approximately 1 day(s).   The patient does have a current PCP (Catherine Lewandowskylugbemiga Jegede, MD) and [facility] after [facility].  The patient does not have transportation limitations that hinder transportation to clinic appointments.  .Services Needed at time of discharge: Y = Yes, Blank = No PT:   OT:   RN:   Equipment:   Other:     LOS: 1 day   Catherine PurlErik Aretta Stetzel, DO 10/07/2013, 8:49 AM

## 2013-10-07 NOTE — Progress Notes (Signed)
Dr Mikey BussingHoffman notified ot ST(120 at rest, 145 ambulating).

## 2013-10-07 NOTE — Progress Notes (Signed)
CSW (Clinical Child psychotherapistocial Worker) made aware that pt has traffic court hearing today at 12pm and needs a letter faxed to clerk. CSW faxed letter to available fax number and provided letter and phone number to pt. Pt aware she will need to confirm that court has received letter and needs no further documents. At this time, pt has no further CSW needs. Please consult for any new needs.  [name], LCSWA [phone]

## 2013-10-08 LAB — BASIC METABOLIC PANEL
BUN: 14 mg/dL (ref 6–23)
CALCIUM: 8.9 mg/dL (ref 8.4–10.5)
CO2: 27 mEq/L (ref 19–32)
Chloride: 103 mEq/L (ref 96–112)
Creatinine, Ser: 0.63 mg/dL (ref 0.50–1.10)
GFR calc Af Amer: >90 mL/min (ref >90)
Glucose, Bld: 92 mg/dL (ref 70–99)
Potassium: 4.2 mEq/L (ref 3.7–5.3)
SODIUM: 141 meq/L (ref 137–147)

## 2013-10-08 LAB — GLUCOSE, CAPILLARY: GLUCOSE-CAPILLARY: 83 mg/dL (ref 70–99)

## 2013-10-08 MED ORDER — IPRATROPIUM-ALBUTEROL 0.5-2.5 (3) MG/3ML IN SOLN
3.0000 mL | Freq: Four times a day (QID) | RESPIRATORY_TRACT | Status: DC
  Administered 2013-10-08 – 2013-10-09 (×5): 3 mL via RESPIRATORY_TRACT
  Filled 2013-10-08 (×5): qty 3

## 2013-10-08 MED ORDER — IPRATROPIUM-ALBUTEROL 0.5-2.5 (3) MG/3ML IN SOLN: 3.0000 mL | RESPIRATORY_TRACT | Status: DC

## 2013-10-08 MED ORDER — PANTOPRAZOLE SODIUM 40 MG PO TBEC
40.0000 mg | DELAYED_RELEASE_TABLET | Freq: Every day | ORAL | Status: DC
  Administered 2013-10-08 – 2013-10-09 (×2): 40 mg via ORAL
  Filled 2013-10-08 (×2): qty 1

## 2013-10-08 MED ORDER — PREDNISONE 50 MG PO TABS
60.0000 mg | ORAL_TABLET | Freq: Once | ORAL | Status: AC
  Administered 2013-10-09: 60 mg via ORAL
  Filled 2013-10-08: qty 1

## 2013-10-08 NOTE — Progress Notes (Signed)
Pt seen and examined with Dr. Andrey CampanileWilson. She currently states SOB is improving with decreased wheezing.  I agree with documentation as outlined in Dr. Tawana ScaleWilson's note.   I will continue with slow steroid taper over 2 weeks. Patient now on prednisone 60 mg. Will attempt to taper oxygen off. Pt with O2 sat of 100% on 2 L [place]. Will need Advair on discharge  Leukocytosis likely secondary to stress reaction. Unlikely infectious. No need to monitor at this time  Likely discharge in AM if patient has improved

## 2013-10-08 NOTE — Progress Notes (Signed)
Subjective: Catherine Porter was seen and examined this AM.  She is feeling about the same.  Still wheezy.  Not dyspneic on 2L.  Appetite is good.    Objective: Vital signs in last 24 hours: Filed Vitals:   10/07/13 2300 10/08/13 0018 10/08/13 0524 10/08/13 0805  BP: 121/78  126/83   Pulse: 84  62   Temp: 98.1 F (36.7 C)  98 F (36.7 C)   TempSrc: Oral  Oral   Resp: 18  18   Height:      Weight:   90 kg (198 lb 6.6 oz)   SpO2: 98% 97% 99% 100%   Weight change: -1.173 kg (-2 lb 9.4 oz)  Intake/Output Summary (Last 24 hours) at 10/08/13 1253 Last data filed at 10/08/13 0054  Gross per 24 hour  Intake      0 ml  Output      3 ml  Net     -3 ml   General: sitting up in bed getting nebulizer treatment, speaking in full sentences HEENT: [place]/AT, mucous membranes moist Cardiac: RRR Pulm: expiratory wheezing bilaterally, peak flow pre-trx 170, post-trx 210 Abd: soft, nontender, nondistended, BS present Ext: warm and well perfused, no pedal edema Neuro: AAO x 3  Lab Results: Basic Metabolic Panel:  Recent Labs Lab 10/06/13 1429  10/06/13 1457 10/07/13 0705 10/08/13 0708  NA 140  --  135* 138 141  K 3.4*  --  3.5* 4.7 4.2  CL 98  --  96 101 103  CO2  --   < > 16* 20 27  GLUCOSE 143*  --  129* 130* 92  BUN 6  --  7 7 14   CREATININE 0.70  --  0.53 0.48* 0.63  CALCIUM  --   < > 9.5 9.7 8.9  MG  --   --  1.9  --   --   < > = values in this interval not displayed.  CBG:  Recent Labs Lab 10/07/13 0743 10/08/13 0803  GLUCAP 144* 83   Medications: I have reviewed the patient's current medications. Scheduled Meds: . dextromethorphan-guaiFENesin  1 tablet Oral BID  . enoxaparin (LOVENOX) injection  40 mg Subcutaneous Q24H  . ipratropium-albuterol  3 mL Nebulization Q6H  . loratadine  10 mg Oral Daily  . sodium chloride  3 mL Intravenous Q12H   Continuous Infusions: none PRN Meds:.acetaminophen, acetaminophen, diphenhydrAMINE, HYDROcodone-homatropine,  ipratropium-albuterol  Assessment/Plan: 23 year old woman with adult-onset asthma (dx 2 years ago) here for increasing dyspnea and wheezing for past few days.  Has not had Advair in a few months 2/2 to cost.    1. Asthma exacerbation - patient improving some, peak flow 210 (175 yesterday), expected ~400.  - patient transitioned to prednisone 60mg  daily today, will plan on 2 week taper at discharge. - continue scheduled Duonebs q6h and q2h prn - continue peak flow measurements - continue lortadine - continue Hycodan for cough - wean O2 as tolerated - plan for Advair 250-50 on discharge, consider nebulizer- appreciate CM help with medication assistance  2. Leukocytosis - Likely stress reaction patient afebrile, now on steroids, no need to monitor.  3. AG metabolic acidosis, resolved - likely 2/2 to rapid correction of respiratory alkalosis.  Lactic acid normal.  VTE ppx:  Lovenox  Dispo: Disposition is deferred at this time, awaiting improvement of current medical problems.  Anticipated discharge in approximately 1 day(s).   The patient does have a current PCP ([name], MD) and does  [facility] after discharge.  The patient does not have transportation limitations that hinder transportation to clinic appointments.  .Services Needed at time of discharge: Y = Yes, Blank = No PT:   OT:   RN:   Equipment:   Other:     LOS: 2 days   Evelena PeatAlex Addalee Kavanagh, DO 10/08/2013, 12:53 PM

## 2013-10-08 NOTE — Care Management Note (Signed)
    Page 1 of 1   10/08/2013     5:54:23 PM CARE MANAGEMENT NOTE 10/08/2013  Patient:  Kirstie PeriORDONEZ,Darlene M   Account Number:  [id number]  Date Initiated:  10/08/2013  Documentation initiated by:  Letha CapeAYLOR,Alara Daniel  Subjective/Objective Assessment:   dx asthma  admit- lives with family, pta indep.     Action/Plan:   Anticipated DC Date:  10/09/2013   Anticipated DC Plan:  HOME/SELF CARE      DC Planning Services  CM consult  [facility]      Choice offered to / List presented to:             Status of service:  Completed, signed off Medicare Important Message given?   (If response is "NO", the following Medicare IM given date fields will be blank) Date Medicare IM given:   Date Additional Medicare IM given:    Discharge Disposition:  HOME/SELF CARE  Per UR Regulation:  Reviewed for med. necessity/level of care/duration of stay  If discussed at Long Length of Stay Meetings, dates discussed:    Comments:  10/08/13 1752 Letha Capeeborah Karrissa Parchment RN, BSN [phone] patient lives with family, [facility], NCM made appt for orange card as well. NCM gave patient a Best boyMatch Program Letter for her meds.

## 2013-10-08 NOTE — ED Provider Notes (Signed)
Medical screening examination/treatment/procedure(s) were performed by non-physician practitioner and as supervising physician I was immediately available for consultation/collaboration.   EKG Interpretation None       Juliet RudeNathan R. Rubin PayorPickering, MD 10/08/13 [phone]

## 2013-10-09 LAB — GLUCOSE, CAPILLARY: Glucose-Capillary: 93 mg/dL (ref 70–99)

## 2013-10-09 MED ORDER — ALBUTEROL SULFATE HFA 108 (90 BASE) MCG/ACT IN AERS: 1.0000 | INHALATION_SPRAY | Freq: Four times a day (QID) | RESPIRATORY_TRACT | Status: DC | PRN

## 2013-10-09 MED ORDER — PREDNISONE 20 MG PO TABS: 60.0000 mg | ORAL_TABLET | Freq: Once | ORAL | Status: DC

## 2013-10-09 MED ORDER — LORATADINE 10 MG PO TABS: 10.0000 mg | ORAL_TABLET | Freq: Every day | ORAL | Status: DC

## 2013-10-09 MED ORDER — PANTOPRAZOLE SODIUM 40 MG PO TBEC: 40.0000 mg | DELAYED_RELEASE_TABLET | Freq: Every day | ORAL | Status: DC

## 2013-10-09 MED ORDER — FLUTICASONE-SALMETEROL 250-50 MCG/DOSE IN AEPB: 1.0000 | INHALATION_SPRAY | Freq: Every day | RESPIRATORY_TRACT | Status: DC

## 2013-10-09 MED ORDER — DM-GUAIFENESIN ER 30-600 MG PO TB12: 1.0000 | ORAL_TABLET | Freq: Two times a day (BID) | ORAL | Status: DC

## 2013-10-09 NOTE — Progress Notes (Signed)
Pt complain of heart burn - on call physician paged - new med order entered.

## 2013-10-09 NOTE — Progress Notes (Signed)
Subjective: NAEON. Pt doing well this AM weaned off O2. Not feeling wheezy and slept comfortably.   Objective: Vital signs in last 24 hours: Filed Vitals:   10/08/13 2155 10/09/13 0218 10/09/13 0537 10/09/13 0853  BP: 112/71  114/77   Pulse: 96  70   Temp: 98.2 F (36.8 C)  97.8 F (36.6 C)   TempSrc: Oral  Oral   Resp: 18  18   Height:      Weight:   200 lb 13.4 oz (91.1 kg)   SpO2: 96% 95% 98% 97%   Weight change: 2 lb 6.8 oz (1.1 kg)  Intake/Output Summary (Last 24 hours) at 10/09/13 0959 Last data filed at 10/08/13 1300  Gross per 24 hour  Intake    240 ml  Output      0 ml  Net    240 ml   General: lying in bed, NAD, talking in full sentences HEENT: [place]/AT, mucous membranes moist Cardiac: RRR Pulm: CTAB, pre-treatment peak flows in 200s Abd: soft, nontender, nondistended, BS present Ext: warm and well perfused, no pedal edema Neuro: AAO x 3  Lab Results: Basic Metabolic Panel:  Recent Labs Lab 10/06/13 1429  10/06/13 1457 10/07/13 0705 10/08/13 0708  NA 140  --  135* 138 141  K 3.4*  --  3.5* 4.7 4.2  CL 98  --  96 101 103  CO2  --   < > 16* 20 27  GLUCOSE 143*  --  129* 130* 92  BUN 6  --  7 7 14   CREATININE 0.70  --  0.53 0.48* 0.63  CALCIUM  --   < > 9.5 9.7 8.9  MG  --   --  1.9  --   --   < > = values in this interval not displayed.  CBG:  Recent Labs Lab 10/07/13 0743 10/08/13 0803 10/09/13 0751  GLUCAP 144* 83 93   Medications: I have reviewed the patient's current medications. Scheduled Meds: . dextromethorphan-guaiFENesin  1 tablet Oral BID  . enoxaparin (LOVENOX) injection  40 mg Subcutaneous Q24H  . ipratropium-albuterol  3 mL Nebulization Q6H  . loratadine  10 mg Oral Daily  . pantoprazole  40 mg Oral Daily  . predniSONE  60 mg Oral Once  . sodium chloride  3 mL Intravenous Q12H   Continuous Infusions: none PRN Meds:.acetaminophen, acetaminophen, diphenhydrAMINE, HYDROcodone-homatropine,  ipratropium-albuterol  Assessment/Plan: 23 year old woman with adult-onset asthma (dx 2 years ago) here for increasing dyspnea and wheezing for past few days.  Has not had Advair in a few months 2/2 to cost.    1. Acute Asthma exacerbation resolved- Pt not requiring supplemental O2, pre-treatment peak flow 210 and not having any wheezing on exam, pt feels to baseline - patient transitioned to prednisone 60mg  daily today, will plan on 2 week taper at discharge. - continue scheduled Duonebs q6h and q2h prn - continue peak flow measurements - continue lortadine - continue Hycodan for cough - wean O2 as tolerated - plan for Advair 250-50 on discharge- appreciate CM help with medication assistance  VTE ppx:  Lovenox  Dispo: Disposition is deferred at this time, awaiting improvement of current medical problems.  Anticipated discharge in approximately 1 day(s).   The patient does have a current PCP ([name], MD) and does [facility] after discharge.  The patient does not have transportation limitations that hinder transportation to clinic appointments.  .Services Needed at time of discharge:  Y = Yes, Blank = No PT:   OT:   RN:   Equipment:   Other:     LOS: 3 days   Christen BameNora Latiqua Daloia, MD 10/09/2013, 9:59 AM

## 2013-10-09 NOTE — Progress Notes (Signed)
Pt given discharge instructions, prescriptions, inhalers and peak flow meter.  PIV removed.  Pt taken to discharge location via wheelchair.

## 2013-10-11 NOTE — Discharge Summary (Signed)
I have reviewed Dr. Caleen JobsSadek's note and agree with the documantation as outlined

## 2013-10-22 ENCOUNTER — Encounter: Payer: Self-pay | Admitting: Internal Medicine

## 2013-10-22 ENCOUNTER — Ambulatory Visit: Payer: Self-pay | Attending: Internal Medicine | Admitting: Internal Medicine

## 2013-10-22 ENCOUNTER — Ambulatory Visit: Payer: Self-pay

## 2013-10-22 VITALS — BP 127/87 | HR 62 | Temp 98.7°F | Resp 16 | Wt 210.4 lb

## 2013-10-22 DIAGNOSIS — Z9109 Other allergy status, other than to drugs and biological substances: Secondary | ICD-10-CM

## 2013-10-22 DIAGNOSIS — G43909 Migraine, unspecified, not intractable, without status migrainosus: Secondary | ICD-10-CM | POA: Insufficient documentation

## 2013-10-22 DIAGNOSIS — J45909 Unspecified asthma, uncomplicated: Secondary | ICD-10-CM | POA: Insufficient documentation

## 2013-10-22 DIAGNOSIS — Z79899 Other long term (current) drug therapy: Secondary | ICD-10-CM | POA: Insufficient documentation

## 2013-10-22 DIAGNOSIS — F17200 Nicotine dependence, unspecified, uncomplicated: Secondary | ICD-10-CM | POA: Insufficient documentation

## 2013-10-22 MED ORDER — FLUTICASONE PROPIONATE HFA 110 MCG/ACT IN AERO: 2.0000 | INHALATION_SPRAY | Freq: Two times a day (BID) | RESPIRATORY_TRACT | Status: DC

## 2013-10-22 NOTE — Progress Notes (Signed)
Patient here for follow up from hospital admission Was admitted for her asthma

## 2013-10-22 NOTE — Progress Notes (Signed)
MRN: [id number] Name: Catherine Porter  Sex: female Age: 23 y.o. DOB: 11/24/1990  Allergies: Fish allergy  Chief Complaint  Patient presents with  . Hospitalization Follow-up    HPI: Patient is 23 y.o. female who has history of asthma comes today for followup, recently hospitalized with acute exacerbation, EMR reviewed patient was treated with IV Solu-Medrol albuterol ipratropium nebulization, patient symptomatically improved and discharged with a tapering dose of oral prednisone as per patient she has finished prednisone which helped her with the symptoms her, she was also prescribed Advair but patient has not used it but wants to be on maintenance medication. Patient is going to try Flovent. Patient was also prescribed Claritin and she thinks it's been helping. Patient denies any chest and shortness of breath and has not used albuterol today.  Past Medical History  Diagnosis Date  . Preeclampsia   . Asthma   . Migraine     "weekly" (10/06/2013)  . Arthritis     "fingers" (10/06/2013)    Past Surgical History  Procedure Laterality Date  . Cholecystectomy  2014  . Tonsillectomy  ~ 2008  . Tonsillectomy/adenoidectomy/turbinate reduction  ~ 2008      Medication List       This list is accurate as of: 10/22/13 11:36 AM.  Always use your most recent med list.               albuterol 108 (90 BASE) MCG/ACT inhaler  Commonly known as:  PROVENTIL HFA;VENTOLIN HFA  Inhale 1-2 puffs into the lungs every 6 (six) hours as needed for wheezing or shortness of breath.     dextromethorphan-guaiFENesin 30-600 MG per 12 hr tablet  Commonly known as:  MUCINEX DM  Take 1 tablet by mouth 2 (two) times daily.     fluticasone 110 MCG/ACT inhaler  Commonly known as:  FLOVENT HFA  Inhale 2 puffs into the lungs every 12 (twelve) hours.     Fluticasone-Salmeterol 250-50 MCG/DOSE Aepb  Commonly known as:  ADVAIR DISKUS  Inhale 1 puff into the lungs daily.     levonorgestrel 20 MCG/24HR  IUD  Commonly known as:  MIRENA  1 each by Intrauterine route continuous.     loratadine 10 MG tablet  Commonly known as:  CLARITIN  Take 1 tablet (10 mg total) by mouth daily.     pantoprazole 40 MG tablet  Commonly known as:  PROTONIX  Take 1 tablet (40 mg total) by mouth daily.     predniSONE 20 MG tablet  Commonly known as:  DELTASONE  Take 3 tablets (60 mg total) by mouth once. Take 3 tabs for 3 days, 2 tablets for 3 days, then 1 tablet until finished        Meds ordered this encounter  Medications  . fluticasone (FLOVENT HFA) 110 MCG/ACT inhaler    Sig: Inhale 2 puffs into the lungs every 12 (twelve) hours.    Dispense:  1 Inhaler    Refill:  12    Immunization History  Administered Date(s) Administered  . Influenza Split 05/20/2013    Family History  Problem Relation Age of Onset  . Anesthesia problems Neg Hx     History  Substance Use Topics  . Smoking status: Light Tobacco Smoker -- 4 years    Types: Cigarettes  . Smokeless tobacco: Never Used     Comment: 10/06/2013 "might smoke 2 times/yr"  . Alcohol Use: Yes     Comment: 10/06/2013 "drink til I'm buzzed <  once/month; never get drunk"    Review of Systems   As noted in HPI  Filed Vitals:   10/22/13 1116  BP: 127/87  Pulse: 62  Temp: 98.7 F (37.1 C)  Resp: 16    Physical Exam  Physical Exam  Constitutional: No distress.  Eyes: EOM are normal. Pupils are equal, round, and reactive to light.  Cardiovascular: Normal rate and regular rhythm.   Pulmonary/Chest: Breath sounds normal. No respiratory distress. She has no wheezes. She has no rales.  Musculoskeletal: She exhibits no edema.    CBC    Component Value Date/Time   WBC 17.5* 10/07/2013 0705   RBC 4.92 10/07/2013 0705   HGB 15.0 10/07/2013 0705   HCT 44.9 10/07/2013 0705   PLT 272 10/07/2013 0705   MCV 91.3 10/07/2013 0705   LYMPHSABS 1.2 10/06/2013 1457   MONOABS 0.3 10/06/2013 1457   EOSABS 0.1 10/06/2013 1457   BASOSABS 0.0  10/06/2013 1457    CMP     Component Value Date/Time   NA 141 10/08/2013 0708   K 4.2 10/08/2013 0708   CL 103 10/08/2013 0708   CO2 27 10/08/2013 0708   GLUCOSE 92 10/08/2013 0708   BUN 14 10/08/2013 0708   CREATININE 0.63 10/08/2013 0708   CREATININE 0.53 08/27/2011 0700   CALCIUM 8.9 10/08/2013 0708   PROT 7.0 03/28/2013 0120   ALBUMIN 3.7 03/28/2013 0120   AST 22 03/28/2013 0120   ALT 37* 03/28/2013 0120   ALKPHOS 100 03/28/2013 0120   BILITOT 0.2* 03/28/2013 0120   GFRNONAA >90 10/08/2013 0708   GFRAA >90 10/08/2013 0708    No results found for this basename: chol, tri, ldl    No components found with this basename: hga1c    Lab Results  Component Value Date/Time   AST 22 03/28/2013  1:20 AM    Assessment and Plan  Unspecified asthma(493.90) - Plan: Will start her on fluticasone (FLOVENT HFA) 110 MCG/ACT inhaler maintenance, albuterol when necessary,  Environmental allergies Continue with Claritin.   Return in about 3 months (around 01/22/2014) for asthma.  Doris Cheadleeepak Madyx Delfin, MD

## 2013-10-26 ENCOUNTER — Ambulatory Visit: Payer: Self-pay | Attending: Internal Medicine

## 2013-10-27 ENCOUNTER — Other Ambulatory Visit: Payer: Self-pay | Admitting: Internal Medicine

## 2013-10-27 DIAGNOSIS — J45909 Unspecified asthma, uncomplicated: Secondary | ICD-10-CM

## 2013-10-27 MED ORDER — FLUTICASONE PROPIONATE HFA 110 MCG/ACT IN AERO: 2.0000 | INHALATION_SPRAY | Freq: Two times a day (BID) | RESPIRATORY_TRACT | Status: DC

## 2013-10-27 MED ORDER — ALBUTEROL SULFATE HFA 108 (90 BASE) MCG/ACT IN AERS: 1.0000 | INHALATION_SPRAY | Freq: Four times a day (QID) | RESPIRATORY_TRACT | Status: DC | PRN

## 2014-02-13 ENCOUNTER — Encounter (HOSPITAL_COMMUNITY): Payer: Self-pay | Admitting: Emergency Medicine

## 2014-02-13 ENCOUNTER — Emergency Department (HOSPITAL_COMMUNITY)
Admission: EM | Admit: 2014-02-13 | Discharge: 2014-02-13 | Disposition: A | Discharge status: Home / Self Care | Payer: Self-pay | Attending: Emergency Medicine | Admitting: Emergency Medicine

## 2014-02-13 DIAGNOSIS — Z8679 Personal history of other diseases of the circulatory system: Secondary | ICD-10-CM | POA: Insufficient documentation

## 2014-02-13 DIAGNOSIS — R0789 Other chest pain: Secondary | ICD-10-CM | POA: Insufficient documentation

## 2014-02-13 DIAGNOSIS — Z79899 Other long term (current) drug therapy: Secondary | ICD-10-CM | POA: Insufficient documentation

## 2014-02-13 DIAGNOSIS — J45901 Unspecified asthma with (acute) exacerbation: Secondary | ICD-10-CM | POA: Insufficient documentation

## 2014-02-13 DIAGNOSIS — J069 Acute upper respiratory infection, unspecified: Secondary | ICD-10-CM

## 2014-02-13 DIAGNOSIS — J4541 Moderate persistent asthma with (acute) exacerbation: Secondary | ICD-10-CM

## 2014-02-13 DIAGNOSIS — IMO0002 Reserved for concepts with insufficient information to code with codable children: Secondary | ICD-10-CM | POA: Insufficient documentation

## 2014-02-13 DIAGNOSIS — Z8739 Personal history of other diseases of the musculoskeletal system and connective tissue: Secondary | ICD-10-CM | POA: Insufficient documentation

## 2014-02-13 DIAGNOSIS — J45909 Unspecified asthma, uncomplicated: Secondary | ICD-10-CM | POA: Insufficient documentation

## 2014-02-13 DIAGNOSIS — F17200 Nicotine dependence, unspecified, uncomplicated: Secondary | ICD-10-CM | POA: Insufficient documentation

## 2014-02-13 MED ORDER — ALBUTEROL (5 MG/ML) CONTINUOUS INHALATION SOLN
10.0000 mg/h | INHALATION_SOLUTION | Freq: Once | RESPIRATORY_TRACT | Status: AC
  Administered 2014-02-13: 10 mg/h via RESPIRATORY_TRACT
  Filled 2014-02-13: qty 20

## 2014-02-13 MED ORDER — PREDNISONE 20 MG PO TABS: ORAL_TABLET | ORAL | Status: DC

## 2014-02-13 MED ORDER — IPRATROPIUM BROMIDE 0.02 % IN SOLN
0.5000 mg | Freq: Once | RESPIRATORY_TRACT | Status: AC
  Administered 2014-02-13: 0.5 mg via RESPIRATORY_TRACT

## 2014-02-13 MED ORDER — IPRATROPIUM-ALBUTEROL 0.5-2.5 (3) MG/3ML IN SOLN: 3.0000 mL | Freq: Once | RESPIRATORY_TRACT | Status: DC

## 2014-02-13 MED ORDER — PREDNISONE 20 MG PO TABS
60.0000 mg | ORAL_TABLET | Freq: Once | ORAL | Status: AC
  Administered 2014-02-13: 60 mg via ORAL
  Filled 2014-02-13: qty 3

## 2014-02-13 MED ORDER — ALBUTEROL SULFATE (2.5 MG/3ML) 0.083% IN NEBU
5.0000 mg | INHALATION_SOLUTION | Freq: Once | RESPIRATORY_TRACT | Status: AC
  Administered 2014-02-13: 5 mg via RESPIRATORY_TRACT
  Filled 2014-02-13: qty 6

## 2014-02-13 MED ORDER — ALBUTEROL SULFATE HFA 108 (90 BASE) MCG/ACT IN AERS: 1.0000 | INHALATION_SPRAY | Freq: Four times a day (QID) | RESPIRATORY_TRACT | Status: DC | PRN

## 2014-02-13 NOTE — ED Notes (Signed)
Pt alert and oriented at discharge.  Pt acting a little sleepy and advised by this RN to call a family member or get a cup of coffee before driving herself home.  Pt verbalized understanding of discharge and prescriptions.  Pt escorted with stable ambulation to the waiting room by this RN.

## 2014-02-13 NOTE — ED Provider Notes (Signed)
CSN: [id number]     Arrival date & time 02/13/14  0426 History   First MD Initiated Contact with Patient 02/13/14 [phone]     Chief Complaint  Patient presents with  . Asthma     (Consider location/radiation/quality/duration/timing/severity/associated sxs/prior Treatment) HPI Comments: 23 year old female with asthma and hypoperfusion history presents with chest tightness and worsening asthma symptoms for the past week. Patient taking her nebulizers as directed and ran out of albuterol. Patient is a light tobacco user. Nothing is improved her symptoms. Patient has had coughing congestion recently no fevers. No recent surgeries, leg swelling or blood clot history or heart problems. This feels similar to her asthma history.  Patient is a 23 y.o. female presenting with asthma. The history is provided by the patient.  Asthma This is a recurrent problem. Associated symptoms include shortness of breath. Pertinent negatives include no chest pain, no abdominal pain and no headaches.    Past Medical History  Diagnosis Date  . Preeclampsia   . Asthma   . Migraine     "weekly" (10/06/2013)  . Arthritis     "fingers" (10/06/2013)   Past Surgical History  Procedure Laterality Date  . Cholecystectomy  2014  . Tonsillectomy  ~ 2008  . Tonsillectomy/adenoidectomy/turbinate reduction  ~ 2008   Family History  Problem Relation Age of Onset  . Anesthesia problems Neg Hx    History  Substance Use Topics  . Smoking status: Light Tobacco Smoker -- 4 years    Types: Cigarettes  . Smokeless tobacco: Never Used     Comment: 10/06/2013 "might smoke 2 times/yr"  . Alcohol Use: Yes     Comment: 10/06/2013 "drink til I'm buzzed < once/month; never get drunk"   OB History   Grav Para Term Preterm Abortions TAB SAB Ect Mult Living   0 0 0 0 0 0 2     Obstetric Comments   2 children, one boy and girl     Review of Systems  Constitutional: Negative for fever and chills.  HENT: Positive for  congestion.   Eyes: Negative for visual disturbance.  Respiratory: Positive for cough, chest tightness and shortness of breath.   Cardiovascular: Negative for chest pain.  Gastrointestinal: Negative for vomiting and abdominal pain.  Genitourinary: Negative for dysuria and flank pain.  Musculoskeletal: Negative for back pain, neck pain and neck stiffness.  Skin: Negative for rash.  Neurological: Negative for light-headedness and headaches.      Allergies  Fish allergy  Home Medications   Prior to Admission medications   Medication Sig Start Date End Date Taking? Authorizing Provider  albuterol (PROVENTIL HFA;VENTOLIN HFA) 108 (90 BASE) MCG/ACT inhaler Inhale 1-2 puffs into the lungs every 6 (six) hours as needed for wheezing or shortness of breath. 10/27/13  Yes Olugbemiga E Hyman Hopes, MD  fluticasone (FLOVENT HFA) 110 MCG/ACT inhaler Inhale 2 puffs into the lungs every 12 (twelve) hours. 10/27/13  Yes Quentin Angst, MD  Fluticasone-Salmeterol (ADVAIR DISKUS) 250-50 MCG/DOSE AEPB Inhale 1 puff into the lungs daily. 10/09/13  Yes Christen Bame, MD  levonorgestrel (MIRENA) 20 MCG/24HR IUD 1 each by Intrauterine route continuous.    Yes Historical Provider, MD   BP 116/71  Pulse 78  Temp(Src) 98 F (36.7 C) (Oral)  Resp 12  Ht  (1.626 m)  Wt 194 lb (87.998 kg)  BMI 33.28 kg/m2  SpO2 98% Physical Exam  Nursing note and vitals reviewed. Constitutional: She is oriented to person, place, and time.  She appears well-developed and well-nourished.  HENT:  Head: Normocephalic and atraumatic.  Eyes: Right eye exhibits no discharge. Left eye exhibits no discharge.  Neck: Normal range of motion. Neck supple. No tracheal deviation present.  Cardiovascular: Normal rate and regular rhythm.   Pulmonary/Chest: She has wheezes (expiratory bilateral, mild tachypnea).  Abdominal: Soft. She exhibits no distension. There is no tenderness. There is no guarding.  Musculoskeletal: She exhibits no  edema.  Neurological: She is alert and oriented to person, place, and time.  Skin: Skin is warm. No rash noted.  Psychiatric: She has a normal mood and affect.    ED Course  Procedures (including critical care time) Labs Review Labs Reviewed - No data to display  Imaging Review No results found.   EKG Interpretation None      MDM   Final diagnoses:  Acute asthma exacerbation, moderate persistent  URI (upper respiratory infection)   Clinically acute asthma exacerbation, patient has failed outpatient therapy and has also ran out of her medicines. Plan for albuterol neb, recheck afterwards in mild improvement. Continuous albuterol ordered, steroids ordered.  Pt improved on recheck.   Results and differential diagnosis were discussed with the patient/parent/guardian. Close follow up outpatient was discussed, comfortable with the plan.   Medications  albuterol (PROVENTIL,VENTOLIN) solution continuous neb (not administered)  predniSONE (DELTASONE) tablet 60 mg (not administered)  albuterol (PROVENTIL) (2.5 MG/3ML) 0.083% nebulizer solution 5 mg (5 mg Nebulization Given 02/13/14 0522)  ipratropium (ATROVENT) nebulizer solution 0.5 mg (0.5 mg Nebulization Given 02/13/14 0522)    Filed Vitals:   02/13/14 0500 02/13/14 0515 02/13/14 0520 02/13/14 0530  BP: 117/74 113/56  116/71  Pulse: 86 91  78  Temp:      TempSrc:      Resp: 12     Height:      Weight:      SpO2: 95% 97% 100% 98%        Enid Skeens, MD 02/13/14 [phone]

## 2014-02-13 NOTE — ED Notes (Signed)
Pt. reports progressing SOB with wheezing , productive cough , chest congestion/tightness for several days unrelieved by MDI rescue inhaler.

## 2014-02-13 NOTE — Discharge Instructions (Signed)
If you were given medicines take as directed.  If you are on coumadin or contraceptives realize their levels and effectiveness is altered by many different medicines.  If you have any reaction (rash, tongues swelling, other) to the medicines stop taking and see a physician.   Please follow up as directed and return to the ER or see a physician for new or worsening symptoms.  Thank you. Filed Vitals:   02/13/14 0500 02/13/14 0515 02/13/14 0520 02/13/14 0530  BP: 117/74 113/56  116/71  Pulse: 86 91  78  Temp:      TempSrc:      Resp: 12     Height:      Weight:      SpO2: 95% 97% 100% 98%

## 2014-02-21 ENCOUNTER — Emergency Department (HOSPITAL_COMMUNITY): Payer: Self-pay

## 2014-02-21 ENCOUNTER — Encounter (HOSPITAL_COMMUNITY): Payer: Self-pay | Admitting: Emergency Medicine

## 2014-02-21 ENCOUNTER — Emergency Department (HOSPITAL_COMMUNITY)
Admission: EM | Admit: 2014-02-21 | Discharge: 2014-02-21 | Disposition: A | Discharge status: Home / Self Care | Payer: Self-pay | Attending: Emergency Medicine | Admitting: Emergency Medicine

## 2014-02-21 DIAGNOSIS — Z79899 Other long term (current) drug therapy: Secondary | ICD-10-CM | POA: Insufficient documentation

## 2014-02-21 DIAGNOSIS — J45901 Unspecified asthma with (acute) exacerbation: Secondary | ICD-10-CM | POA: Insufficient documentation

## 2014-02-21 DIAGNOSIS — J45909 Unspecified asthma, uncomplicated: Secondary | ICD-10-CM | POA: Insufficient documentation

## 2014-02-21 DIAGNOSIS — Z8679 Personal history of other diseases of the circulatory system: Secondary | ICD-10-CM | POA: Insufficient documentation

## 2014-02-21 DIAGNOSIS — F17200 Nicotine dependence, unspecified, uncomplicated: Secondary | ICD-10-CM | POA: Insufficient documentation

## 2014-02-21 DIAGNOSIS — Z8739 Personal history of other diseases of the musculoskeletal system and connective tissue: Secondary | ICD-10-CM | POA: Insufficient documentation

## 2014-02-21 MED ORDER — PREDNISONE 20 MG PO TABS: 40.0000 mg | ORAL_TABLET | Freq: Every day | ORAL | Status: DC

## 2014-02-21 MED ORDER — ALBUTEROL SULFATE (2.5 MG/3ML) 0.083% IN NEBU
2.5000 mg | INHALATION_SOLUTION | Freq: Once | RESPIRATORY_TRACT | Status: AC
  Administered 2014-02-21: 2.5 mg via RESPIRATORY_TRACT
  Filled 2014-02-21: qty 3

## 2014-02-21 MED ORDER — IPRATROPIUM BROMIDE 0.02 % IN SOLN: 0.5000 mg | Freq: Once | RESPIRATORY_TRACT | Status: AC

## 2014-02-21 MED ORDER — ALBUTEROL SULFATE (2.5 MG/3ML) 0.083% IN NEBU: 5.0000 mg | INHALATION_SOLUTION | Freq: Once | RESPIRATORY_TRACT | Status: AC

## 2014-02-21 MED ORDER — METHYLPREDNISOLONE SODIUM SUCC 125 MG IJ SOLR
125.0000 mg | Freq: Once | INTRAMUSCULAR | Status: AC
  Administered 2014-02-21: 125 mg via INTRAVENOUS
  Filled 2014-02-21: qty 2

## 2014-02-21 MED ORDER — ALBUTEROL SULFATE HFA 108 (90 BASE) MCG/ACT IN AERS
2.0000 | INHALATION_SPRAY | RESPIRATORY_TRACT | Status: DC
  Administered 2014-02-21: 2 via RESPIRATORY_TRACT
  Filled 2014-02-21: qty 6.7

## 2014-02-21 MED ORDER — IPRATROPIUM-ALBUTEROL 0.5-2.5 (3) MG/3ML IN SOLN
3.0000 mL | Freq: Once | RESPIRATORY_TRACT | Status: AC
  Administered 2014-02-21: 3 mL via RESPIRATORY_TRACT
  Filled 2014-02-21: qty 3

## 2014-02-21 NOTE — Discharge Instructions (Signed)
As discussed, for the next few days please use your albuterol therapy every 4 hours.  You may then taper it's use to fit your symptoms  Please discuss referral to pulmonology with yourr primary care team.  Return here for concerning changes in your condition   Asma, broncoespasmo agudo (Asthma, Acute Bronchospasm) El broncoespasmo agudo causado por el asma tambin se conoce como crisis de asma. Broncoespasmo significa que las vas respiratorias se han estrechado. La causa del estrechamiento es la inflamacin y la constriccin de los msculos de las vas respiratorias (bronquios) que se encuentran en los pulmones. Esto puede dificultar la respiracin o provocarle sibilancias y tos. [place] desencadenantes posibles son:  La caspa que eliminan los animales de la piel, el pelo o las plumas de [place].  Los caros que se encuentran en el polvo de la casa.  [place].  El polen de los rboles o el csped.  [place].  El humo del cigarrillo o del tabaco  Sustancias contaminantes como el polvo, limpiadores hogareos, aerosoles (como los [place] para el cabello), vapores de pintura, sustancias qumicas fuertes u olores intensos.  El aire fro o cambios climticos. El aire fro puede causar inflamacin. El viento aumenta la cantidad de moho y polen del aire.  Emociones fuertes, Engineer, production o rer intensamente.  Estrs.  Ciertos medicamentos como la aspirina o betabloqueantes.  Los sulfitos que se encuentran en las comidas y bebidas como frutas secas y el vino.  Enfermedades infecciosas o inflamatorias, como la gripe, el resfro o la inflamacin de las membranas nasales (rinitis).  El reflujo gastroesofgico (ERGE). El reflujo gastroesofgico es una afeccin en la que los cidos estomacales vuelven al esfago.  Los ejercicios o actividades extenuantes. [place].  Tos intensa, especialmente por la noche.  Opresin en el pecho.  Falta de  aire. DIAGNSTICO  El mdico le har una historia clnica y le har un examen fsico. [place] Comment indicarn radiografas o anlisis de sangre para buscar otras causas de los sntomas u otras enfermedades que puedan desencadenar una crisis de asma.  TRATAMIENTO  El tratamiento est dirigido a reducir la inflamacin y [place] vas respiratorias en los pulmones. La mayor parte de las crisis asmticas se tratan con medicamentos por va inhalatoria. Entre ellos se incluyen los medicamentos de alivio rpido o medicamentos de rescate (como los broncodilatadores) y los medicamentos de control (como los corticoides inhalados). Estos medicamentos se administran a travs de Educational psychologist o de un nebulizador. Los corticoides sistmicos por va oral o por va intravenosa tambin se administran para reducir la inflamacin cuando un ataque es moderado o grave. Los antibiticos se indican solo si hay infeccin bacteriana.  INSTRUCCIONES PARA EL CUIDADO EN EL HOGAR   Reposo.  Beba lquido en abundancia. Esto ayuda a diluir la mucosidad y a Transport planner. Solo consuma productos con cafena moderadamente y no consuma alcohol hasta que se haya recuperado de la enfermedad.  No fume. Evite la exposicin al humo de otros fumadores.  Usted tiene un rol fundamental en mantener su buena salud. Evite la exposicin a lo que [facility] respiratorios.  Mantenga los medicamentos actualizados y al alcance. Siga cuidadosamente el plan de tratamiento del mdico.  Utilice los medicamentos tal como se le indic.  Cuando haya mucho polen o polucin, mantenga las ventanas cerradas y use el aire acondicionado o vaya a lugares con aire acondicionado.  El asma requiere atencin [place]. Concurra a los controles segn las indicaciones. Si  tiene Nutritional therapist de ms de 24 semanas y le han recetado medicamentos nuevos, comntelo con su obstetra y cul es su evolucin. Concurra a las consultas de control con su  mdico segn las indicaciones.  Despus de recuperarse de la crisis de asma, haga una cita con el mdico para conocer cmo puede reducir la probabilidad de futuros ataques. Si no cuenta con un mdico para que controle su asma, haga una cita con un mdico de atencin primaria para hablar de esta enfermedad. [place] DE INMEDIATO SI:   Empeora.  Tiene dificultad para respirar. Si la dificultad es intensa comunquese con el servicio de Multimedia programmer de su localidad (911 en los [place]).  Siente dolor o Adult nurse.  Tiene vmitos.  No puede retener los lquidos.  Elimina una expectoracin verde, amarilla, amarronada o sanguinolenta.  Tiene fiebre y los sntomas empeoran repentinamente.  Presenta dificultad para tragar. ASEGRESE DE QUE:   Comprende estas instrucciones.  Controlar su afeccin.  Recibir ayuda de inmediato si no mejora o si empeora. Document Released: 09/12/2008 Document Revised: 06/01/2013 [facility] Patient Information 2015 [place]. This information is not intended to replace advice given to you by your health care provider. Make sure you discuss any questions you have with your health care provider.

## 2014-02-21 NOTE — Progress Notes (Signed)
Pt states she is no longer SOB and "feels better".  Pt peak flow has improved and very slight wheeze is heard in upper lobe. Pt vitals are stable SPO2 is 99% on RA. MD notified.

## 2014-02-21 NOTE — ED Notes (Signed)
Per pt sts asthma and SOB since last night. sts she has been using her inhaler with no relief. Pt speaking in short sentences.

## 2014-02-21 NOTE — Progress Notes (Signed)
Unable to do initial peak flow test, breathing tx started by RN.

## 2014-02-21 NOTE — ED Notes (Signed)
MD at bedside. 

## 2014-02-21 NOTE — Progress Notes (Signed)
Upon arrival pt unlabored talking on phone with sister. Pt SPO2 on RA is 97%. Pt still has slight wheeze in upper lobe.

## 2014-02-21 NOTE — ED Provider Notes (Signed)
CSN: [id number]     Arrival date & time 02/21/14  1191 History   First MD Initiated Contact with Patient 02/21/14 [phone]     Chief Complaint  Patient presents with  . Asthma     (Consider location/radiation/quality/duration/timing/severity/associated sxs/prior Treatment) HPI Patient presents with dyspnea, anterior chest pain.  Symptoms began over the past few days, soon after completing a course of steroids for a recent asthma exacerbation.  Symptoms progressed over the past day in particular. No relief with OTC medication or albuterol. No concurrent fever, chills, nausea, vomiting, diarrhea. No new swelling, weight loss, weight gain.  Past Medical History  Diagnosis Date  . Preeclampsia   . Asthma   . Migraine     "weekly" (10/06/2013)  . Arthritis     "fingers" (10/06/2013)   Past Surgical History  Procedure Laterality Date  . Cholecystectomy  2014  . Tonsillectomy  ~ 2008  . Tonsillectomy/adenoidectomy/turbinate reduction  ~ 2008   Family History  Problem Relation Age of Onset  . Anesthesia problems Neg Hx    History  Substance Use Topics  . Smoking status: Light Tobacco Smoker -- 4 years    Types: Cigarettes  . Smokeless tobacco: Never Used     Comment: 10/06/2013 "might smoke 2 times/yr"  . Alcohol Use: Yes     Comment: 10/06/2013 "drink til I'm buzzed < once/month; never get drunk"   OB History   Grav Para Term Preterm Abortions TAB SAB Ect Mult Living   0 0 0 0 0 0 2     Obstetric Comments   2 children, one boy and girl     Review of Systems  Constitutional:       Per HPI, otherwise negative  HENT:       Per HPI, otherwise negative  Respiratory:       Per HPI, otherwise negative  Cardiovascular:       Per HPI, otherwise negative  Gastrointestinal: Negative for vomiting.  Endocrine:       Negative aside from HPI  Genitourinary:       Neg aside from HPI   Musculoskeletal:       Per HPI, otherwise negative  Skin: Negative.   Neurological:  Negative for syncope.      Allergies  Fish allergy  Home Medications   Prior to Admission medications   Medication Sig Start Date End Date Taking? Authorizing Provider  albuterol (PROVENTIL HFA;VENTOLIN HFA) 108 (90 BASE) MCG/ACT inhaler Inhale 1-2 puffs into the lungs every 6 (six) hours as needed for wheezing or shortness of breath. 02/13/14  Yes Enid Skeens, MD  levonorgestrel (MIRENA) 20 MCG/24HR IUD 1 each by Intrauterine route continuous.    Yes Historical Provider, MD   BP 122/85  Pulse 88  Temp(Src) 98.1 F (36.7 C)  Resp 20  SpO2 98% Physical Exam  Nursing note and vitals reviewed. Constitutional: She is oriented to person, place, and time. She appears well-developed and well-nourished. No distress.  HENT:  Head: Normocephalic and atraumatic.  Eyes: Conjunctivae and EOM are normal.  Cardiovascular: Normal rate and regular rhythm.   Pulmonary/Chest: Accessory muscle usage present. No stridor. Tachypnea noted. She has decreased breath sounds. She has wheezes.  Abdominal: She exhibits no distension.  Musculoskeletal: She exhibits no edema.  Neurological: She is alert and oriented to person, place, and time. No cranial nerve deficit.  Skin: Skin is warm and dry.  Psychiatric: She has a normal mood and affect.  ED Course  Procedures (including critical care time)  Imaging Review Dg Chest 2 View  02/21/2014   CLINICAL DATA:  Wheezing with history of asthma  EXAM: CHEST  2 VIEW  COMPARISON:  PA and lateral chest of October 06, 2013  FINDINGS: The lungs are adequately inflated. There is no focal infiltrate. The interstitial markings are mildly increased but not significantly changed from the previous study. The heart and pulmonary vascularity are normal. The trachea is midline. There is no pleural effusion. The bony thorax is unremarkable.  IMPRESSION: There is no evidence of pneumonia. Coarse interstitial lung markings are consistent with known reactive airway disease.    Electronically Signed   By: David  [place]   On: 02/21/2014 10:18    Oxygen 97% room air normal  11:46 AM On exam the patient is improved.  We discussed all results, the need to follow up with primary care and pulmonology.  MDM  Patient presents with increased work of breathing.  The patient is not hypoxic, and improved substantially here with multiple albuterol treatments, steroids. Given the recurrence of exacerbation, patient will receive additional resources to obtain follow up with pulmonology in addition to primary care.  Absent fever, distress, there is low suspicion for occult pathology.     Gerhard Munch, MD 02/21/14 1146

## 2014-02-22 ENCOUNTER — Encounter (HOSPITAL_COMMUNITY): Payer: Self-pay | Admitting: Emergency Medicine

## 2014-02-22 ENCOUNTER — Emergency Department (INDEPENDENT_AMBULATORY_CARE_PROVIDER_SITE_OTHER)
Admission: EM | Admit: 2014-02-22 | Discharge: 2014-02-22 | Disposition: A | Discharge status: Nursing Home (Includes ICF) | Payer: Self-pay | Source: Home / Self Care | Attending: Family Medicine | Admitting: Family Medicine

## 2014-02-22 ENCOUNTER — Inpatient Hospital Stay (HOSPITAL_COMMUNITY)
Admission: EM | Admit: 2014-02-22 | Discharge: 2014-02-23 | DRG: 203 | Disposition: A | Discharge status: Home / Self Care | Payer: Self-pay | Attending: Internal Medicine | Admitting: Internal Medicine

## 2014-02-22 DIAGNOSIS — J45909 Unspecified asthma, uncomplicated: Secondary | ICD-10-CM

## 2014-02-22 DIAGNOSIS — J4542 Moderate persistent asthma with status asthmaticus: Secondary | ICD-10-CM

## 2014-02-22 DIAGNOSIS — Z0000 Encounter for general adult medical examination without abnormal findings: Secondary | ICD-10-CM

## 2014-02-22 DIAGNOSIS — F17200 Nicotine dependence, unspecified, uncomplicated: Secondary | ICD-10-CM | POA: Diagnosis present

## 2014-02-22 DIAGNOSIS — I10 Essential (primary) hypertension: Secondary | ICD-10-CM

## 2014-02-22 DIAGNOSIS — J45902 Unspecified asthma with status asthmaticus: Secondary | ICD-10-CM

## 2014-02-22 DIAGNOSIS — R000 Tachycardia, unspecified: Secondary | ICD-10-CM

## 2014-02-22 DIAGNOSIS — J45901 Unspecified asthma with (acute) exacerbation: Principal | ICD-10-CM | POA: Diagnosis present

## 2014-02-22 DIAGNOSIS — I498 Other specified cardiac arrhythmias: Secondary | ICD-10-CM | POA: Diagnosis present

## 2014-02-22 DIAGNOSIS — Z23 Encounter for immunization: Secondary | ICD-10-CM

## 2014-02-22 DIAGNOSIS — Z79899 Other long term (current) drug therapy: Secondary | ICD-10-CM

## 2014-02-22 LAB — CBC WITH DIFFERENTIAL/PLATELET
Basophils Absolute: 0 10*3/uL (ref 0.0–0.1)
Basophils Relative: 0 % (ref 0–1)
Eosinophils Absolute: 0 10*3/uL (ref 0.0–0.7)
Eosinophils Relative: 0 % (ref 0–5)
HEMATOCRIT: 40.7 % (ref 36.0–46.0)
Hemoglobin: 13.5 g/dL (ref 12.0–15.0)
LYMPHS PCT: 5 % — AB (ref 12–46)
Lymphs Abs: 0.6 10*3/uL — ABNORMAL LOW (ref 0.7–4.0)
MCH: 30.2 pg (ref 26.0–34.0)
MCHC: 33.2 g/dL (ref 30.0–36.0)
MCV: 91.1 fL (ref 78.0–100.0)
MONO ABS: 0.1 10*3/uL (ref 0.1–1.0)
Monocytes Relative: 1 % — ABNORMAL LOW (ref 3–12)
Neutro Abs: 11.5 10*3/uL — ABNORMAL HIGH (ref 1.7–7.7)
Neutrophils Relative %: 94 % — ABNORMAL HIGH (ref 43–77)
Platelets: 244 10*3/uL (ref 150–400)
RBC: 4.47 MIL/uL (ref 3.87–5.11)
RDW: 14 % (ref 11.5–15.5)
WBC: 12.3 10*3/uL — AB (ref 4.0–10.5)

## 2014-02-22 LAB — COMPREHENSIVE METABOLIC PANEL
ALT: 24 U/L (ref 0–35)
ANION GAP: 16 — AB (ref 5–15)
AST: 14 U/L (ref 0–37)
Albumin: 3.5 g/dL (ref 3.5–5.2)
Alkaline Phosphatase: 76 U/L (ref 39–117)
BUN: 10 mg/dL (ref 6–23)
CO2: 20 meq/L (ref 19–32)
Calcium: 8.4 mg/dL (ref 8.4–10.5)
Chloride: 106 mEq/L (ref 96–112)
Creatinine, Ser: 0.58 mg/dL (ref 0.50–1.10)
GFR calc non Af Amer: >90 mL/min (ref >90)
GLUCOSE: 181 mg/dL — AB (ref 70–99)
Potassium: 3.8 mEq/L (ref 3.7–5.3)
Sodium: 142 mEq/L (ref 137–147)
Total Bilirubin: <0.2 mg/dL — ABNORMAL LOW (ref 0.3–1.2)
Total Protein: 6.6 g/dL (ref 6.0–8.3)

## 2014-02-22 LAB — PREGNANCY, URINE: Preg Test, Ur: NEGATIVE

## 2014-02-22 MED ORDER — PNEUMOCOCCAL VAC POLYVALENT 25 MCG/0.5ML IJ INJ
0.5000 mL | INJECTION | INTRAMUSCULAR | Status: AC
  Administered 2014-02-23: 0.5 mL via INTRAMUSCULAR
  Filled 2014-02-22: qty 0.5

## 2014-02-22 MED ORDER — IPRATROPIUM BROMIDE 0.02 % IN SOLN
0.5000 mg | Freq: Once | RESPIRATORY_TRACT | Status: AC
  Administered 2014-02-22: 0.5 mg via RESPIRATORY_TRACT

## 2014-02-22 MED ORDER — LEVOFLOXACIN IN D5W 750 MG/150ML IV SOLN
750.0000 mg | Freq: Once | INTRAVENOUS | Status: DC
  Administered 2014-02-22: 750 mg via INTRAVENOUS
  Filled 2014-02-22: qty 150

## 2014-02-22 MED ORDER — ALBUTEROL SULFATE (2.5 MG/3ML) 0.083% IN NEBU
INHALATION_SOLUTION | RESPIRATORY_TRACT | Status: AC
  Filled 2014-02-22: qty 3

## 2014-02-22 MED ORDER — SODIUM CHLORIDE 0.9 % IV SOLN
INTRAVENOUS | Status: AC
  Administered 2014-02-23: 05:00:00 via INTRAVENOUS

## 2014-02-22 MED ORDER — METHYLPREDNISOLONE SODIUM SUCC 125 MG IJ SOLR
INTRAMUSCULAR | Status: AC
  Filled 2014-02-22: qty 2

## 2014-02-22 MED ORDER — LEVOFLOXACIN IN D5W 750 MG/150ML IV SOLN
750.0000 mg | INTRAVENOUS | Status: DC
  Administered 2014-02-22: 750 mg via INTRAVENOUS
  Filled 2014-02-22 (×2): qty 150

## 2014-02-22 MED ORDER — ACETAMINOPHEN 650 MG RE SUPP: 650.0000 mg | Freq: Four times a day (QID) | RECTAL | Status: DC | PRN

## 2014-02-22 MED ORDER — INFLUENZA VAC SPLIT QUAD 0.5 ML IM SUSY
0.5000 mL | PREFILLED_SYRINGE | INTRAMUSCULAR | Status: AC
  Administered 2014-02-23: 0.5 mL via INTRAMUSCULAR
  Filled 2014-02-22: qty 0.5

## 2014-02-22 MED ORDER — ALBUTEROL SULFATE (2.5 MG/3ML) 0.083% IN NEBU: 2.5000 mg | INHALATION_SOLUTION | RESPIRATORY_TRACT | Status: DC | PRN

## 2014-02-22 MED ORDER — ENOXAPARIN SODIUM 40 MG/0.4ML ~~LOC~~ SOLN
40.0000 mg | SUBCUTANEOUS | Status: DC
  Filled 2014-02-22 (×2): qty 0.4

## 2014-02-22 MED ORDER — ONDANSETRON HCL 4 MG/2ML IJ SOLN: 4.0000 mg | Freq: Four times a day (QID) | INTRAMUSCULAR | Status: DC | PRN

## 2014-02-22 MED ORDER — ONDANSETRON HCL 4 MG PO TABS: 4.0000 mg | ORAL_TABLET | Freq: Four times a day (QID) | ORAL | Status: DC | PRN

## 2014-02-22 MED ORDER — METHYLPREDNISOLONE SODIUM SUCC 125 MG IJ SOLR
60.0000 mg | Freq: Three times a day (TID) | INTRAMUSCULAR | Status: DC
  Administered 2014-02-22 – 2014-02-23 (×2): 60 mg via INTRAVENOUS
  Filled 2014-02-22 (×5): qty 0.96

## 2014-02-22 MED ORDER — GUAIFENESIN-DM 100-10 MG/5ML PO SYRP
5.0000 mL | ORAL_SOLUTION | ORAL | Status: DC | PRN
  Filled 2014-02-22: qty 5

## 2014-02-22 MED ORDER — ACETAMINOPHEN 325 MG PO TABS: 650.0000 mg | ORAL_TABLET | Freq: Four times a day (QID) | ORAL | Status: DC | PRN

## 2014-02-22 MED ORDER — IPRATROPIUM-ALBUTEROL 0.5-2.5 (3) MG/3ML IN SOLN
3.0000 mL | Freq: Four times a day (QID) | RESPIRATORY_TRACT | Status: DC | PRN
  Administered 2014-02-23: 3 mL via RESPIRATORY_TRACT
  Filled 2014-02-22: qty 3

## 2014-02-22 MED ORDER — IPRATROPIUM BROMIDE 0.02 % IN SOLN
0.5000 mg | Freq: Once | RESPIRATORY_TRACT | Status: AC
  Administered 2014-02-22: 0.5 mg via RESPIRATORY_TRACT
  Filled 2014-02-22: qty 2.5

## 2014-02-22 MED ORDER — SODIUM CHLORIDE 0.9 % IJ SOLN
3.0000 mL | Freq: Two times a day (BID) | INTRAMUSCULAR | Status: DC
  Administered 2014-02-22 – 2014-02-23 (×2): 3 mL via INTRAVENOUS

## 2014-02-22 MED ORDER — IPRATROPIUM BROMIDE 0.02 % IN SOLN
RESPIRATORY_TRACT | Status: AC
  Filled 2014-02-22: qty 2.5

## 2014-02-22 MED ORDER — METHYLPREDNISOLONE SODIUM SUCC 125 MG IJ SOLR
125.0000 mg | Freq: Once | INTRAMUSCULAR | Status: AC
  Administered 2014-02-22: 125 mg via INTRAVENOUS

## 2014-02-22 MED ORDER — SODIUM CHLORIDE 0.9 % IV SOLN
Freq: Once | INTRAVENOUS | Status: AC
  Administered 2014-02-22: 14:00:00 via INTRAVENOUS

## 2014-02-22 MED ORDER — ALBUTEROL SULFATE (2.5 MG/3ML) 0.083% IN NEBU
5.0000 mg | INHALATION_SOLUTION | Freq: Once | RESPIRATORY_TRACT | Status: AC
  Administered 2014-02-22: 5 mg via RESPIRATORY_TRACT

## 2014-02-22 MED ORDER — ALBUTEROL (5 MG/ML) CONTINUOUS INHALATION SOLN
10.0000 mg/h | INHALATION_SOLUTION | Freq: Once | RESPIRATORY_TRACT | Status: AC
  Administered 2014-02-22: 10 mg/h via RESPIRATORY_TRACT
  Filled 2014-02-22: qty 20

## 2014-02-22 NOTE — H&P (Signed)
PATIENT DETAILS Name: Catherine Porter Age: 23 y.o. Sex: female Date of Birth: 08-Nov-1990 Admit Date: 02/22/2014 ZOX:WRUEAV, Keane Scrape, MD   CHIEF COMPLAINT:  Shortness of breath  HPI: Catherine Porter is a 23 y.o. female with a Past Medical History of goiter asthma (never intubated) who presents today with the above noted complaint. Per patient, approximately 8-90 his back, patient presented to the ED with cough, wheezing and shortness of breath, was thought to have asthma exacerbation, given steroids/nebs and sent home on ibuprofen inhaler and prednisone. She took prednisone for 3 days, felt significantly better, however a few days later she started having similar symptoms beating, cough and worsening shortness of breath. She presented to the emergency room yesterday, where she was treated in a similar manner and sent home. Unfortunately, she presented to a local urgent care with similar symptoms and was promptly referred to the emergency room. In the emergency room, she was given a hour long nebulizer, following which she felt significantly better, however was found to still have some wheezing, and tachycardia. I was asked to admit this patient for further evaluation and treatment. Patient denies any fever. Her cough is mostly productive with white/yellow phlegm. She denies any allergy-like symptoms. She does not smoke. During my evaluation, she seemed very comfortable, and claimed that she is very close to her usual baseline. On exam she still has some wheeze, she's not using any excessive muscles, and is easily speaking in full sentences.  ALLERGIES:   Allergies  Allergen Reactions  . Fish Allergy Swelling    All Seafood also    PAST MEDICAL HISTORY: Past Medical History  Diagnosis Date  . Preeclampsia   . Asthma   . Migraine     "weekly" (10/06/2013)  . Arthritis     "fingers" (10/06/2013)    PAST SURGICAL HISTORY: Past Surgical History  Procedure Laterality Date  .  Cholecystectomy  2014  . Tonsillectomy  ~ 2008  . Tonsillectomy/adenoidectomy/turbinate reduction  ~ 2008    MEDICATIONS AT HOME: Prior to Admission medications   Medication Sig Start Date End Date Taking? Authorizing Provider  albuterol (PROVENTIL HFA;VENTOLIN HFA) 108 (90 BASE) MCG/ACT inhaler Inhale 1-2 puffs into the lungs every 6 (six) hours as needed for wheezing or shortness of breath. 02/13/14  Yes Enid Skeens, MD  levonorgestrel (MIRENA) 20 MCG/24HR IUD 1 each by Intrauterine route continuous.    Yes Historical Provider, MD  predniSONE (DELTASONE) 20 MG tablet Take 2 tablets (40 mg total) by mouth daily. 02/22/14 02/25/14 Yes Gerhard Munch, MD    FAMILY HISTORY: Family History  Problem Relation Age of Onset  . Anesthesia problems Neg Hx     SOCIAL HISTORY:  reports that she has been smoking Cigarettes.  She has been smoking about 0.00 packs per day for the past 4 years. She has never used smokeless tobacco. She reports that she drinks alcohol. She reports that she does not use illicit drugs.  REVIEW OF SYSTEMS:  Constitutional:   No  weight loss, night sweats,  Fevers, chills, fatigue.  HEENT:    No headaches, Difficulty swallowing,Tooth/dental problems,Sore throat,  No sneezing, itching, ear ache, nasal congestion, post nasal drip,   Cardio-vascular: No chest pain,  Orthopnea, PND, swelling in lower extremities, anasarca, dizziness, palpitations  GI:  No heartburn, indigestion, abdominal pain, nausea, vomiting, diarrhea, change in   bowel habits, loss of appetite  Resp:  No coughing up of blood.Marland KitchenNo chest wall deformity  Skin:  no rash or lesions.  GU:  no dysuria, change in color of urine, no urgency or frequency.  No flank pain.  Musculoskeletal: No joint pain or swelling.  No decreased range of motion.  No back pain.  Psych: No change in mood or affect. No depression or anxiety.  No memory loss.   PHYSICAL EXAM: Blood pressure 117/62, pulse 106,  temperature 98.1 F (36.7 C), temperature source Oral, resp. rate 27, height  (1.626 m), weight 88.451 kg (195 lb), SpO2 92.00%.  General appearance :Awake, alert, not in any distress. Speech Clear. Not toxic Looking. Looks very comfortable- not using any accessory muscles, easily speaking in full sentences. HEENT: Atraumatic and Normocephalic, pupils equally reactive to light and accomodation Neck: supple, no JVD. No cervical lymphadenopathy.  Chest:Good air entry bilaterally, still with some scattered rhonchi. CVS: S1 S2 regular, no murmurs.  Abdomen: Bowel sounds present, Non tender and not distended with no gaurding, rigidity or rebound. Extremities: B/L Lower Ext shows no edema, both legs are warm to touch Neurology: Awake alert, and oriented X 3, CN II-XII intact, Non focal Skin:No Rash Wounds:N/A  LABS ON ADMISSION:  No results found for this basename: NA, K, CL, CO2, GLUCOSE, BUN, CREATININE, CALCIUM, MG, PHOS,  in the last 72 hours No results found for this basename: AST, ALT, ALKPHOS, BILITOT, PROT, ALBUMIN,  in the last 72 hours No results found for this basename: LIPASE, AMYLASE,  in the last 72 hours No results found for this basename: WBC, NEUTROABS, HGB, HCT, MCV, PLT,  in the last 72 hours No results found for this basename: CKTOTAL, CKMB, CKMBINDEX, TROPONINI,  in the last 72 hours No results found for this basename: DDIMER,  in the last 72 hours No components found with this basename: POCBNP,    RADIOLOGIC STUDIES ON ADMISSION: Dg Chest 2 View  02/21/2014   CLINICAL DATA:  Wheezing with history of asthma  EXAM: CHEST  2 VIEW  COMPARISON:  PA and lateral chest of October 06, 2013  FINDINGS: The lungs are adequately inflated. There is no focal infiltrate. The interstitial markings are mildly increased but not significantly changed from the previous study. The heart and pulmonary vascularity are normal. The trachea is midline. There is no pleural effusion. The bony thorax  is unremarkable.  IMPRESSION: There is no evidence of pneumonia. Coarse interstitial lung markings are consistent with known reactive airway disease.   Electronically Signed   By: David  [place]   On: 02/21/2014 10:18     ASSESSMENT AND PLAN: Present on Admission:  . Asthma exacerbation - Given repeated ED visits, she will be admitted for further optimization. We'll place her on IV Solu-Medrol, scheduled nebulized bronchodilators and empiric IV Levaquin. She will followed closely, and her clinical course will be monitored. Further adjustment to medications, further changes to treatment plan will depend upon her response to the above noted treatment. For now, she appears very comfortable, and much improved than on her initial presentation. Stat labs, stat urine pregnancy test have been ordered and will be followed.  . Sinus tachycardia - Like is secondary to bronchodilators. Low suspicion for any other pathology at this point. Monitor in telemetry, follow closely.  Further plan will depend as patient's clinical course evolves and further radiologic and laboratory data become available. Patient will be monitored closely.   Above noted plan was discussed with patient,she was in agreement.   DVT Prophylaxis: Prophylactic Lovenox  Code Status: Full Code  Total time spent  for admission equals 45 minutes.  [facility] Pager [phone]  If 7PM-7AM, please contact night-coverage [email or web address] Password [email or web address] 02/22/2014, 5:15 PM  **Disclaimer: This note may have been dictated with voice recognition software. Similar sounding words can inadvertently be transcribed and this note may contain transcription errors which may not have been corrected upon publication of note.**

## 2014-02-22 NOTE — ED Notes (Signed)
Pt in from [facility] via [facility], c/o sob & wheezing, per report pt rcvd x 1 neb tx @ [facility] & x  Albuterol & 0.5 mg Atrovent in route, pt c/o productive cough with small amt of clear phlegm, onset of symptoms x 1 wk ago, seen here for tx x2, pt reports receiving steroids & an inhaler with no relief of symptoms, pt A&O x4, follows commands, speaks in complete sentences, reports mid CP tightness worse with coughing

## 2014-02-22 NOTE — ED Provider Notes (Addendum)
CSN: [id number]     Arrival date & time 02/22/14  1403 History   First MD Initiated Contact with Patient 02/22/14 1407     Chief Complaint  Patient presents with  . Shortness of Breath     (Consider location/radiation/quality/duration/timing/severity/associated sxs/prior Treatment) HPI 23 year old female presents with shortness of breath and wheezing from urgent care. She's been having dyspnea for the past week has been using her inhalers more more frequently. She was seen in the ED last night and given steroids and albuterol. She felt improved enough to go home. She's been having a cough but denies any fevers or chills. Because her up we'll have her set up and this morning she went to urgent care. She's given an albuterol nebs there and then 1 by EMS with some improvement but still feels an increased work of breathing. She has never been intubated for her asthma before.  Past Medical History  Diagnosis Date  . Preeclampsia   . Asthma   . Migraine     "weekly" (10/06/2013)  . Arthritis     "fingers" (10/06/2013)   Past Surgical History  Procedure Laterality Date  . Cholecystectomy  2014  . Tonsillectomy  ~ 2008  . Tonsillectomy/adenoidectomy/turbinate reduction  ~ 2008   Family History  Problem Relation Age of Onset  . Anesthesia problems Neg Hx    History  Substance Use Topics  . Smoking status: Light Tobacco Smoker -- 4 years    Types: Cigarettes  . Smokeless tobacco: Never Used     Comment: 10/06/2013 "might smoke 2 times/yr"  . Alcohol Use: Yes     Comment: 10/06/2013 "drink til I'm buzzed < once/month; never get drunk"   OB History   Grav Para Term Preterm Abortions TAB SAB Ect Mult Living   0 0 0 0 0 0 2     Obstetric Comments   2 children, one boy and girl     Review of Systems  Constitutional: Negative for fever.  Respiratory: Positive for cough, shortness of breath and wheezing.   Cardiovascular: Negative for chest pain.  Gastrointestinal: Negative for  vomiting and abdominal pain.  All other systems reviewed and are negative.     Allergies  Fish allergy  Home Medications   Prior to Admission medications   Medication Sig Start Date End Date Taking? Authorizing Provider  albuterol (PROVENTIL HFA;VENTOLIN HFA) 108 (90 BASE) MCG/ACT inhaler Inhale 1-2 puffs into the lungs every 6 (six) hours as needed for wheezing or shortness of breath. 02/13/14   Enid Skeens, MD  levonorgestrel (MIRENA) 20 MCG/24HR IUD 1 each by Intrauterine route continuous.     Historical Provider, MD  predniSONE (DELTASONE) 20 MG tablet Take 2 tablets (40 mg total) by mouth daily. 02/22/14 02/25/14  Gerhard Munch, MD   BP 131/75  Pulse 100  Temp(Src) 98.1 F (36.7 C) (Oral)  Resp 31  Ht  (1.626 m)  Wt 195 lb (88.451 kg)  BMI 33.46 kg/m2  SpO2 100% Physical Exam  Nursing note and vitals reviewed. Constitutional: She is oriented to person, place, and time. She appears well-developed and well-nourished.  HENT:  Head: Normocephalic and atraumatic.  Right Ear: External ear normal.  Left Ear: External ear normal.  Nose: Nose normal.  Eyes: Right eye exhibits no discharge. Left eye exhibits no discharge.  Cardiovascular: Normal rate, regular rhythm and normal heart sounds.   Pulmonary/Chest: Tachypnea noted. She has wheezes.  Abdominal: Soft. She exhibits no distension. There is  no tenderness.  Neurological: She is alert and oriented to person, place, and time.  Skin: Skin is warm and dry.    ED Course  Procedures (including critical care time) Labs Review Labs Reviewed - No data to display  Imaging Review Dg Chest 2 View  02/21/2014   CLINICAL DATA:  Wheezing with history of asthma  EXAM: CHEST  2 VIEW  COMPARISON:  PA and lateral chest of October 06, 2013  FINDINGS: The lungs are adequately inflated. There is no focal infiltrate. The interstitial markings are mildly increased but not significantly changed from the previous study. The heart and  pulmonary vascularity are normal. The trachea is midline. There is no pleural effusion. The bony thorax is unremarkable.  IMPRESSION: There is no evidence of pneumonia. Coarse interstitial lung markings are consistent with known reactive airway disease.   Electronically Signed   By: David  [place]   On: 02/21/2014 10:18     EKG Interpretation None      MDM   Final diagnoses:  Asthma exacerbation    Patient significantly improved with a continuous albuterol treatment. However she does some increased work of breathing and oxygen saturations down into the low 90s with ambulation. Given that she's been having such a difficult time controlling her asthma has required much albuterol should benefit from inpatient admission. She was given steroids at the urgent care prior to arrival. Will consult the hospitalist for admission.    Audree Camel, MD 02/22/14 1639  After discussing with hospitalist he would like labs and antibiotics given for presumed PNA given her symptoms have been ongoing x 1 week. Will give levaquin and draw labs.  Audree Camel, MD 02/22/14 [phone]

## 2014-02-22 NOTE — ED Notes (Signed)
Pulse 131; Oxygen 92 while ambulating

## 2014-02-22 NOTE — ED Notes (Signed)
Unable to obtain EKG. Dr. Criss Alvine notified

## 2014-02-22 NOTE — ED Notes (Signed)
Respiratory notified re: hour long neb treatment

## 2014-02-22 NOTE — ED Provider Notes (Signed)
CSN: [id number]     Arrival date & time 02/22/14  1239 History   First MD Initiated Contact with Patient 02/22/14 1257     Chief Complaint  Patient presents with  . Asthma   (Consider location/radiation/quality/duration/timing/severity/associated sxs/prior Treatment) HPI Comments: Patient reports a one week history of asthma exacerbation that is not responding favorably to use of albuterol inhaler every 2-4 hours and oral prednisone that she began taking yesterday. States she used to also take Advair on a daily basis, however, this medication was recently discontinued.  Was seen by her PCP ([facility]) one week ago and albuterol MDI renewed. Was seen again in ER last night and oral steroids were initiated. States symptoms have continued to become worse and is using albuterol every two hours and still reports that "I feel like a cannot breathe." Denies chest pain, hemoptysis, cough, calf pain or calf swelling.  Is a non-smoker. States she developed albuterol as an adult about 4 years ago. Has had one hospitalization but no intubations.  CXR 02/21/2014 unremarkable  Patient is a 23 y.o. female presenting with asthma. The history is provided by the patient.  Asthma This is a recurrent problem.    Past Medical History  Diagnosis Date  . Preeclampsia   . Asthma   . Migraine     "weekly" (10/06/2013)  . Arthritis     "fingers" (10/06/2013)   Past Surgical History  Procedure Laterality Date  . Cholecystectomy  2014  . Tonsillectomy  ~ 2008  . Tonsillectomy/adenoidectomy/turbinate reduction  ~ 2008   Family History  Problem Relation Age of Onset  . Anesthesia problems Neg Hx    History  Substance Use Topics  . Smoking status: Light Tobacco Smoker -- 4 years    Types: Cigarettes  . Smokeless tobacco: Never Used     Comment: 10/06/2013 "might smoke 2 times/yr"  . Alcohol Use: Yes     Comment: 10/06/2013 "drink til I'm buzzed < once/month; never get drunk"    OB History   Grav Para Term Preterm Abortions TAB SAB Ect Mult Living   0 0 0 0 0 0 2     Obstetric Comments   2 children, one boy and girl     Review of Systems  All other systems reviewed and are negative.   Allergies  Fish allergy  Home Medications   Prior to Admission medications   Medication Sig Start Date End Date Taking? Authorizing Provider  albuterol (PROVENTIL HFA;VENTOLIN HFA) 108 (90 BASE) MCG/ACT inhaler Inhale 1-2 puffs into the lungs every 6 (six) hours as needed for wheezing or shortness of breath. 02/13/14  Yes Enid Skeens, MD  predniSONE (DELTASONE) 20 MG tablet Take 2 tablets (40 mg total) by mouth daily. 02/22/14 02/25/14 Yes Gerhard Munch, MD  levonorgestrel (MIRENA) 20 MCG/24HR IUD 1 each by Intrauterine route continuous.     Historical Provider, MD   BP 133/81  Pulse 101  Temp(Src) 98.1 F (36.7 C) (Oral)  Resp 20  SpO2 98% Physical Exam  Nursing note and vitals reviewed. Constitutional: She is oriented to person, place, and time. She appears well-developed and well-nourished.  +moderate increased work of breathing  HENT:  Head: Normocephalic and atraumatic.  Eyes: Conjunctivae are normal.  Neck: Normal range of motion. Neck supple.  Cardiovascular: Normal rate, regular rhythm and normal heart sounds.   Pulmonary/Chest: No stridor. She has decreased breath sounds in the right lower field and the left lower field.  She has wheezes in the right upper field, the right middle field, the left upper field and the left middle field. She has no rhonchi. She has no rales.  +moderate accessory muscle use and increased work of breathing  Musculoskeletal: Normal range of motion.  Neurological: She is alert and oriented to person, place, and time.  Skin: Skin is warm and dry. No rash noted. No erythema.  Psychiatric: She has a normal mood and affect. Her behavior is normal.    ED Course  Procedures (including critical care time) Labs Review Labs  Reviewed - No data to display  Imaging Review Dg Chest 2 View  02/21/2014   CLINICAL DATA:  Wheezing with history of asthma  EXAM: CHEST  2 VIEW  COMPARISON:  PA and lateral chest of October 06, 2013  FINDINGS: The lungs are adequately inflated. There is no focal infiltrate. The interstitial markings are mildly increased but not significantly changed from the previous study. The heart and pulmonary vascularity are normal. The trachea is midline. There is no pleural effusion. The bony thorax is unremarkable.  IMPRESSION: There is no evidence of pneumonia. Coarse interstitial lung markings are consistent with known reactive airway disease.   Electronically Signed   By: David  [place]   On: 02/21/2014 10:18     MDM   1. Status asthmaticus, moderate persistent   No hypoxia or hypotension. Afebrile with mild tachycardia. No favorable response to outpatient management of acute asthma exacerbation. To be transferred to [facility] ER via EMS for continued management.     Ria Clock, [place] 02/22/14 1341

## 2014-02-22 NOTE — ED Notes (Signed)
On set yesterday worse at night.  Wheezing.  Using inhaler every four and prednisone.   No relief with inhalers.

## 2014-02-23 LAB — BASIC METABOLIC PANEL
Anion gap: 12 (ref 5–15)
BUN: 8 mg/dL (ref 6–23)
CALCIUM: 9.3 mg/dL (ref 8.4–10.5)
CHLORIDE: 104 meq/L (ref 96–112)
CO2: 23 mEq/L (ref 19–32)
CREATININE: 0.47 mg/dL — AB (ref 0.50–1.10)
GFR calc non Af Amer: >90 mL/min (ref >90)
Glucose, Bld: 126 mg/dL — ABNORMAL HIGH (ref 70–99)
Potassium: 4 mEq/L (ref 3.7–5.3)
Sodium: 139 mEq/L (ref 137–147)

## 2014-02-23 LAB — CBC
HCT: 40.8 % (ref 36.0–46.0)
HEMOGLOBIN: 13.8 g/dL (ref 12.0–15.0)
MCH: 30.7 pg (ref 26.0–34.0)
MCHC: 33.8 g/dL (ref 30.0–36.0)
MCV: 90.9 fL (ref 78.0–100.0)
Platelets: 271 10*3/uL (ref 150–400)
RBC: 4.49 MIL/uL (ref 3.87–5.11)
RDW: 14.1 % (ref 11.5–15.5)
WBC: 16.1 10*3/uL — ABNORMAL HIGH (ref 4.0–10.5)

## 2014-02-23 MED ORDER — PREDNISONE 10 MG PO TABS: ORAL_TABLET | ORAL | Status: DC

## 2014-02-23 MED ORDER — BUDESONIDE-FORMOTEROL FUMARATE 160-4.5 MCG/ACT IN AERO
2.0000 | INHALATION_SPRAY | Freq: Two times a day (BID) | RESPIRATORY_TRACT | Status: DC
  Administered 2014-02-23: 2 via RESPIRATORY_TRACT
  Filled 2014-02-23: qty 6

## 2014-02-23 MED ORDER — ALBUTEROL SULFATE HFA 108 (90 BASE) MCG/ACT IN AERS: 2.0000 | INHALATION_SPRAY | RESPIRATORY_TRACT | Status: DC | PRN

## 2014-02-23 MED ORDER — DOXYCYCLINE HYCLATE 50 MG PO CAPS: 100.0000 mg | ORAL_CAPSULE | Freq: Two times a day (BID) | ORAL | Status: DC

## 2014-02-23 MED ORDER — GUAIFENESIN-DM 100-10 MG/5ML PO SYRP: 5.0000 mL | ORAL_SOLUTION | ORAL | Status: DC | PRN

## 2014-02-23 MED ORDER — BUDESONIDE-FORMOTEROL FUMARATE 160-4.5 MCG/ACT IN AERO: 2.0000 | INHALATION_SPRAY | Freq: Two times a day (BID) | RESPIRATORY_TRACT | Status: DC

## 2014-02-23 NOTE — Progress Notes (Signed)
Utilization review completed. Carley Glendenning, RN, BSN. 

## 2014-02-23 NOTE — Care Management Note (Signed)
    Page 1 of 1   02/23/2014     11:41:09 AM CARE MANAGEMENT NOTE 02/23/2014  Patient:  Catherine Porter, Catherine Porter   Account Number:  [id number]  Date Initiated:  02/23/2014  Documentation initiated by:  Letha Cape  Subjective/Objective Assessment:   dx asthma ex  admit- lives with spouse. pta indep.     Action/Plan:   Anticipated DC Date:  02/23/2014   Anticipated DC Plan:  HOME/SELF CARE      DC Planning Services  CM consult  Medication Assistance  [facility]      Choice offered to / List presented to:             Status of service:  Completed, signed off Medicare Important Message given?  NO (If response is "NO", the following Medicare IM given date fields will be blank) Date Medicare IM given:   Medicare IM given by:   Date Additional Medicare IM given:   Additional Medicare IM given by:    Discharge Disposition:  HOME/SELF CARE  Per UR Regulation:  Reviewed for med. necessity/level of care/duration of stay  If discussed at Long Length of Stay Meetings, dates discussed:    Comments:  02/23/14 1138 Letha Cape RN, BSN [phone] patient lives with spouse, patient is for dc today, she [facility],  NCM called to make an hosp f/u apt for patient, they stated that patient will need to call in the am to schedule her apt beause they were booked and can not make apt 's more than five days out.  NCM informed patient of this information and put in follow up as well. NCM informed patient to go to [facility] at dc to pick up medications she will get symbicort free sample today and the other meds will be either $4 or $10 , she stated she can afford those.  NCM informed her to sign up for the pass program for the symbicort at the pharmacy as well.

## 2014-02-23 NOTE — Progress Notes (Signed)
[name] to be D/C'd Home per MD order.  Discussed with the patient and all questions fully answered.    Medication List         albuterol 108 (90 BASE) MCG/ACT inhaler  Commonly known as:  PROVENTIL HFA;VENTOLIN HFA  Inhale 2 puffs into the lungs every 4 (four) hours as needed for wheezing or shortness of breath.     budesonide-formoterol 160-4.5 MCG/ACT inhaler  Commonly known as:  SYMBICORT  Inhale 2 puffs into the lungs 2 (two) times daily.     doxycycline 50 MG capsule  Commonly known as:  VIBRAMYCIN  Take 2 capsules (100 mg total) by mouth 2 (two) times daily.     guaiFENesin-dextromethorphan 100-10 MG/5ML syrup  Commonly known as:  ROBITUSSIN DM  Take 5 mLs by mouth every 4 (four) hours as needed for cough.     levonorgestrel 20 MCG/24HR IUD  Commonly known as:  MIRENA  1 each by Intrauterine route continuous.     predniSONE 10 MG tablet  Commonly known as:  DELTASONE  - Take 4 tablets (40 mg) daily for 2 days, then,  - Take 3 tablets (30 mg) daily for 2 days, then,  - Take 2 tablets (20 mg) daily for 2 days, then,  - Take 1 tablets (10 mg) daily for 1 days, then stop        VVS, Skin clean, dry and intact without evidence of skin break down, no evidence of skin tears noted. IV catheter discontinued intact. Site without signs and symptoms of complications. Dressing and pressure applied.  An After Visit Summary was printed and given to the patient.  D/c education completed with patient/family including follow up instructions, medication list, d/c activities limitations if indicated, with other d/c instructions as indicated by MD - patient able to verbalize understanding, all questions fully answered.   Patient instructed to return to ED, call 911, or call MD for any changes in condition.   Patient escorted via WC, and D/C home via private auto.  Beckey Downing F 02/23/2014 11:55 AM

## 2014-02-23 NOTE — Discharge Summary (Signed)
PATIENT DETAILS Name: Catherine Porter Age: 23 y.o. Sex: female Date of Birth: [birth year] MRN: [id number]. Admitting Physician: Maretta Bees, MD [id number], Keane Scrape, MD  Admit Date: 02/22/2014 Discharge date: 02/23/2014  Recommendations for Outpatient Follow-up:  1. Optimize asthma medications- started on Symbicort on discharge.  PRIMARY DISCHARGE DIAGNOSIS:  Principal Problem:   Asthma exacerbation      PAST MEDICAL HISTORY: Past Medical History  Diagnosis Date  . Preeclampsia   . Asthma   . Migraine     "not often anymore" (02/22/2014)  . Arthritis     "fingers" (02/22/2014)    DISCHARGE MEDICATIONS:   Medication List         albuterol 108 (90 BASE) MCG/ACT inhaler  Commonly known as:  PROVENTIL HFA;VENTOLIN HFA  Inhale 2 puffs into the lungs every 4 (four) hours as needed for wheezing or shortness of breath.     budesonide-formoterol 160-4.5 MCG/ACT inhaler  Commonly known as:  SYMBICORT  Inhale 2 puffs into the lungs 2 (two) times daily.     doxycycline 50 MG capsule  Commonly known as:  VIBRAMYCIN  Take 2 capsules (100 mg total) by mouth 2 (two) times daily.     guaiFENesin-dextromethorphan 100-10 MG/5ML syrup  Commonly known as:  ROBITUSSIN DM  Take 5 mLs by mouth every 4 (four) hours as needed for cough.     levonorgestrel 20 MCG/24HR IUD  Commonly known as:  MIRENA  1 each by Intrauterine route continuous.     predniSONE 10 MG tablet  Commonly known as:  DELTASONE  - Take 4 tablets (40 mg) daily for 2 days, then,  - Take 3 tablets (30 mg) daily for 2 days, then,  - Take 2 tablets (20 mg) daily for 2 days, then,  - Take 1 tablets (10 mg) daily for 1 days, then stop        ALLERGIES:   Allergies  Allergen Reactions  . Fish Allergy Swelling    All Seafood also    BRIEF HPI:  See H&P, Labs, Consult and Test reports for all details in brief, patient was admitted for recurrent shortness of breath secondary to asthma exacerbation  over the past 1-2 weeks requiring multiple ED/urgent care visits.  CONSULTATIONS:   None  PERTINENT RADIOLOGIC STUDIES: Dg Chest 2 View  02/21/2014   CLINICAL DATA:  Wheezing with history of asthma  EXAM: CHEST  2 VIEW  COMPARISON:  PA and lateral chest of October 06, 2013  FINDINGS: The lungs are adequately inflated. There is no focal infiltrate. The interstitial markings are mildly increased but not significantly changed from the previous study. The heart and pulmonary vascularity are normal. The trachea is midline. There is no pleural effusion. The bony thorax is unremarkable.  IMPRESSION: There is no evidence of pneumonia. Coarse interstitial lung markings are consistent with known reactive airway disease.   Electronically Signed   By: David  [place]   On: 02/21/2014 10:18     PERTINENT LAB RESULTS: CBC:  Recent Labs  02/22/14 1706 02/23/14 0459  WBC 12.3* 16.1*  HGB 13.5 13.8  HCT 40.7 40.8  PLT 244 271   CMET CMP     Component Value Date/Time   NA 139 02/23/2014 0459   K 4.0 02/23/2014 0459   CL 104 02/23/2014 0459   CO2 23 02/23/2014 0459   GLUCOSE 126* 02/23/2014 0459   BUN 8 02/23/2014 0459   CREATININE 0.47* 02/23/2014 0459   CREATININE 0.53 08/27/2011 0700   CALCIUM  9.3 02/23/2014 0459   PROT 6.6 02/22/2014 1706   ALBUMIN 3.5 02/22/2014 1706   AST 14 02/22/2014 1706   ALT 24 02/22/2014 1706   ALKPHOS 76 02/22/2014 1706   BILITOT <0.2* 02/22/2014 1706   GFRNONAA >90 02/23/2014 0459   GFRAA >90 02/23/2014 0459    GFR Estimated Creatinine Clearance: 118.8 ml/min (by C-G formula based on Cr of 0.47). No results found for this basename: LIPASE, AMYLASE,  in the last 72 hours No results found for this basename: CKTOTAL, CKMB, CKMBINDEX, TROPONINI,  in the last 72 hours No components found with this basename: POCBNP,  No results found for this basename: DDIMER,  in the last 72 hours No results found for this basename: HGBA1C,  in the last 72 hours No results found for this  basename: CHOL, HDL, LDLCALC, TRIG, CHOLHDL, LDLDIRECT,  in the last 72 hours No results found for this basename: TSH, T4TOTAL, FREET3, T3FREE, THYROIDAB,  in the last 72 hours No results found for this basename: VITAMINB12, FOLATE, FERRITIN, TIBC, IRON, RETICCTPCT,  in the last 72 hours Coags: No results found for this basename: PT, INR,  in the last 72 hours Microbiology: No results found for this or any previous visit (from the past 240 hour(s)).   BRIEF HOSPITAL COURSE:   Principal Problem:   Asthma exacerbation   - Patient was admitted for treatment of asthma exacerbation. Unfortunately she has had multiple ED visits/urgent care visits for the same issue. On admission, she was short of breath and required a hour long nebulaizer treatment to control her symptoms. She was admitted and started on steroids, nebulized bronchodilators and Levaquin, with these measures, she has rapidly improved overnight. Her lungs are clear, she claims that she is back to her usual baseline. She was ambulated around the unit without any shortness of breath. Since she has rapidly improved, she is being discharged home in a stable condition. She was not on any maintenance inhaler regimen prior to admission, we have started her on Symbicort. She will also be on tapering prednisone, she will continue to use albuterol inhaler for rescue. She has been asked to followup with her primary care practitioner within one week of discharge.  TODAY-DAY OF DISCHARGE:  Subjective:   Catherine Porter today has no headache,no chest abdominal pain,no new weakness tingling or numbness, feels much better wants to go home today.   Objective:   Blood pressure 127/87, pulse 80, temperature 98.4 F (36.9 C), temperature source Oral, resp. rate 20, height  (1.626 m), weight 88.54 kg (195 lb 3.1 oz), SpO2 93.00%.  Intake/Output Summary (Last 24 hours) at 02/23/14 1009 Last data filed at 02/23/14 0902  Gross per 24 hour  Intake    1038 ml  Output      0 ml  Net   1038 ml   Filed Weights   02/22/14 1413 02/22/14 1419 02/22/14 1814  Weight: 88.451 kg (195 lb) 88.451 kg (195 lb) 88.54 kg (195 lb 3.1 oz)    Exam Awake Alert, Oriented *3, No new F.N deficits, Normal affect [place].AT,PERRAL Supple Neck,No JVD, No cervical lymphadenopathy appriciated.  Symmetrical Chest wall movement, Good air movement bilaterally, CTAB RRR,No Gallops,Rubs or new Murmurs, No Parasternal Heave +ve B.Sounds, Abd Soft, Non tender, No organomegaly appriciated, No rebound -guarding or rigidity. No Cyanosis, Clubbing or edema, No new Rash or bruise  DISCHARGE CONDITION: Stable  DISPOSITION: Home  DISCHARGE INSTRUCTIONS:    Activity:  As tolerated *  Diet recommendation: Regular Diet  Discharge Instructions   Call MD for:  difficulty breathing, headache or visual disturbances    Complete by:  As directed      Diet - low sodium heart healthy    Complete by:  As directed      Increase activity slowly    Complete by:  As directed            Follow-up Information   Follow up with Jeanann Lewandowsky, MD.   Specialty:  Internal Medicine   Contact information:   [address] [place] [address] [phone]       Total Time spent on discharge equals 45 minutes.  SignedJeoffrey Massed 02/23/2014 10:09 AM  **Disclaimer: This note may have been dictated with voice recognition software. Similar sounding words can inadvertently be transcribed and this note may contain transcription errors which may not have been corrected upon publication of note.**

## 2014-02-25 NOTE — ED Provider Notes (Signed)
Medical screening examination/treatment/procedure(s) were performed by resident physician or non-physician practitioner and as supervising physician I was immediately available for consultation/collaboration.   Floriene Jeschke DOUGLAS MD.   Decklyn Hyder D Vianney Kopecky, MD 02/25/14 1641 

## 2014-04-11 ENCOUNTER — Encounter (HOSPITAL_COMMUNITY): Payer: Self-pay | Admitting: Emergency Medicine

## 2014-06-10 ENCOUNTER — Emergency Department (HOSPITAL_COMMUNITY)
Admission: EM | Admit: 2014-06-10 | Discharge: 2014-06-10 | Disposition: A | Discharge status: Home / Self Care | Payer: Self-pay | Attending: Emergency Medicine | Admitting: Emergency Medicine

## 2014-06-10 ENCOUNTER — Encounter (HOSPITAL_COMMUNITY): Payer: Self-pay | Admitting: Emergency Medicine

## 2014-06-10 DIAGNOSIS — J45901 Unspecified asthma with (acute) exacerbation: Secondary | ICD-10-CM | POA: Insufficient documentation

## 2014-06-10 DIAGNOSIS — Z792 Long term (current) use of antibiotics: Secondary | ICD-10-CM | POA: Insufficient documentation

## 2014-06-10 DIAGNOSIS — Z720 Tobacco use: Secondary | ICD-10-CM | POA: Insufficient documentation

## 2014-06-10 DIAGNOSIS — Z8679 Personal history of other diseases of the circulatory system: Secondary | ICD-10-CM | POA: Insufficient documentation

## 2014-06-10 MED ORDER — IPRATROPIUM BROMIDE 0.02 % IN SOLN
0.5000 mg | Freq: Once | RESPIRATORY_TRACT | Status: AC
  Administered 2014-06-10: 0.5 mg via RESPIRATORY_TRACT
  Filled 2014-06-10: qty 2.5

## 2014-06-10 MED ORDER — ALBUTEROL SULFATE HFA 108 (90 BASE) MCG/ACT IN AERS
2.0000 | INHALATION_SPRAY | RESPIRATORY_TRACT | Status: DC | PRN
  Administered 2014-06-10: 2 via RESPIRATORY_TRACT
  Filled 2014-06-10: qty 6.7

## 2014-06-10 MED ORDER — ALBUTEROL SULFATE (2.5 MG/3ML) 0.083% IN NEBU
5.0000 mg | INHALATION_SOLUTION | Freq: Once | RESPIRATORY_TRACT | Status: AC
  Administered 2014-06-10: 5 mg via RESPIRATORY_TRACT
  Filled 2014-06-10: qty 6

## 2014-06-10 MED ORDER — BUDESONIDE-FORMOTEROL FUMARATE 160-4.5 MCG/ACT IN AERO: 2.0000 | INHALATION_SPRAY | Freq: Two times a day (BID) | RESPIRATORY_TRACT | Status: DC

## 2014-06-10 MED ORDER — AEROCHAMBER PLUS W/MASK MISC
1.0000 | Freq: Once | Status: AC
  Administered 2014-06-10: 1
  Filled 2014-06-10: qty 1

## 2014-06-10 MED ORDER — PREDNISONE 10 MG PO TABS: ORAL_TABLET | ORAL | Status: DC

## 2014-06-10 MED ORDER — ALBUTEROL SULFATE HFA 108 (90 BASE) MCG/ACT IN AERS: 2.0000 | INHALATION_SPRAY | RESPIRATORY_TRACT | Status: DC | PRN

## 2014-06-10 MED ORDER — PREDNISONE 20 MG PO TABS
60.0000 mg | ORAL_TABLET | Freq: Once | ORAL | Status: AC
  Administered 2014-06-10: 60 mg via ORAL
  Filled 2014-06-10: qty 3

## 2014-06-10 NOTE — ED Provider Notes (Signed)
CSN: [id number]     Arrival date & time 06/10/14  1851 History   None    Chief Complaint  Patient presents with  . Asthma   Patient is a 24 y.o. female presenting with asthma. The history is provided by the patient and medical records. No language interpreter was used.  Asthma Associated symptoms include shortness of breath. Pertinent negatives include no chest pain, no abdominal pain and no headaches.  This chart was scribed for non-physician practitioner Dierdre Forth, PA-C,  working with Flint Melter, MD, by Andrew Au, ED Scribe. This patient was seen in room Room/bed info not found and the patient's care was started at 9:07 PM.  Catherine Porter is a 24 y.o. female who presents to the Emergency Department complaining of asthma. Patient states she takes Symbicort but has been out of it for several days. She reports worsening of her breathing especially at night since that occurred. Pt states she ran out of medication in her back-up (albuterol) inhaler yesterday and had worsening symptoms of heavy breathing, wheezing and a cough during the night that persisted to this morning. She reports an upcoming appointment [facility] Wednesday for medication refill of Singulair and albuterol. Pt does not have nebulizer at home. Patient reports she feels like she might be "coming down with something" but denies all current URI symptoms including fever or chills.  Past Medical History  Diagnosis Date  . Preeclampsia   . Asthma   . Migraine     "not often anymore" (02/22/2014)  . Arthritis     "fingers" (02/22/2014)   Past Surgical History  Procedure Laterality Date  . Cholecystectomy  2014  . Tonsillectomy  ~ 2008  . Tonsillectomy/adenoidectomy/turbinate reduction  ~ 2008   Family History  Problem Relation Age of Onset  . Anesthesia problems Neg Hx    History  Substance Use Topics  . Smoking status: Current Some Day Smoker -- 4 years    Types: Cigarettes    Last  Attempt to Quit: 10/06/2013  . Smokeless tobacco: Never Used  . Alcohol Use: Yes     Comment: 02/22/2014 "maybe once/month"   OB History    Gravida Para Term Preterm AB TAB SAB Ectopic Multiple Living   0 0 0 0 0 0 2      Obstetric Comments   2 children, one boy and girl     Review of Systems  Constitutional: Negative for fever, chills, diaphoresis, appetite change, fatigue and unexpected weight change.  HENT: Negative for mouth sores.   Eyes: Negative for visual disturbance.  Respiratory: Positive for cough, chest tightness, shortness of breath and wheezing.   Cardiovascular: Negative for chest pain.  Gastrointestinal: Negative for nausea, vomiting, abdominal pain, diarrhea and constipation.  Endocrine: Negative for polydipsia, polyphagia and polyuria.  Genitourinary: Negative for dysuria, urgency, frequency and hematuria.  Musculoskeletal: Negative for back pain and neck stiffness.  Skin: Negative for rash.  Allergic/Immunologic: Negative for immunocompromised state.  Neurological: Negative for syncope, light-headedness and headaches.  Hematological: Does not bruise/bleed easily.  Psychiatric/Behavioral: Negative for sleep disturbance. The patient is not nervous/anxious.    Allergies  Fish allergy  Home Medications   Prior to Admission medications   Medication Sig Start Date End Date Taking? Authorizing Provider  albuterol (PROVENTIL HFA;VENTOLIN HFA) 108 (90 BASE) MCG/ACT inhaler Inhale 2 puffs into the lungs every 4 (four) hours as needed for wheezing or shortness of breath. 06/10/14   Dahlia Client Favor Hackler,  PA-C  budesonide-formoterol (SYMBICORT) 160-4.5 MCG/ACT inhaler Inhale 2 puffs into the lungs 2 (two) times daily. 06/10/14   Bracken Moffa, PA-C  doxycycline (VIBRAMYCIN) 50 MG capsule Take 2 capsules (100 mg total) by mouth 2 (two) times daily. 02/23/14   Shanker Levora Dredge, MD  guaiFENesin-dextromethorphan (ROBITUSSIN DM) 100-10 MG/5ML syrup Take 5 mLs by mouth  every 4 (four) hours as needed for cough. 02/23/14   Shanker Levora Dredge, MD  levonorgestrel (MIRENA) 20 MCG/24HR IUD 1 each by Intrauterine route continuous.     Historical Provider, MD  predniSONE (DELTASONE) 10 MG tablet Take 4 tablets (40 mg) daily for 2 days, then, Take 3 tablets (30 mg) daily for 2 days, then, Take 2 tablets (20 mg) daily for 2 days, then, Take 1 tablets (10 mg) daily for 1 days, then stop 06/10/14   Dahlia Client Mehdi Gironda, PA-C   BP 127/81 mmHg  Pulse 81  Temp(Src) 98.1 F (36.7 C) (Oral)  Resp 18  SpO2 95% Physical Exam  Constitutional: She is oriented to person, place, and time. She appears well-developed and well-nourished. No distress.  HENT:  Head: Normocephalic and atraumatic.  Right Ear: Tympanic membrane, external ear and ear canal normal.  Left Ear: Tympanic membrane, external ear and ear canal normal.  Nose: No epistaxis. Right sinus exhibits no maxillary sinus tenderness and no frontal sinus tenderness. Left sinus exhibits no maxillary sinus tenderness and no frontal sinus tenderness.  Mouth/Throat: Uvula is midline and mucous membranes are normal. Mucous membranes are not pale and not cyanotic. No oropharyngeal exudate, posterior oropharyngeal edema, posterior oropharyngeal erythema or tonsillar abscesses.  Eyes: Conjunctivae are normal. Pupils are equal, round, and reactive to light.  Neck: Normal range of motion and full passive range of motion without pain.  Cardiovascular: Normal rate, normal heart sounds and intact distal pulses.   No murmur heard. Pulmonary/Chest: Effort normal. No accessory muscle usage or stridor. She has decreased breath sounds. She has wheezes. She exhibits tenderness.  Decreased tidal volume and end expiratory and expiratory wheezes throughout No accessory muscle usage No hypoxia No Chest wall tenderness  Abdominal: Soft. Bowel sounds are normal. She exhibits no distension. There is no tenderness.  Musculoskeletal: Normal range of  motion.  Lymphadenopathy:    She has no cervical adenopathy.  Neurological: She is alert and oriented to person, place, and time. She exhibits normal muscle tone. Coordination normal.  Skin: Skin is warm and dry. No rash noted. She is not diaphoretic. No erythema.  Psychiatric: She has a normal mood and affect.  Nursing note and vitals reviewed.   ED Course  Procedures  DIAGNOSTIC STUDIES: Oxygen Saturation is 95% on RA, normal by my interpretation.    COORDINATION OF CARE: 9:07 PM- Pt advised of plan for treatment and pt agrees.  Labs Review Labs Reviewed - No data to display  Imaging Review No results found.   EKG Interpretation None      MDM   Final diagnoses:  Asthma exacerbation   JUSTYCE YEATER presents with asthma exacerbation.  Pt with significant improvement after 1st albuterol nebulizer however patient continues to have decreased tidal volume and wheezes throughout. Will repeat.  9:05 PM Patient ambulated in ED with O2 saturations maintained >90, no current signs of respiratory distress. Lung exam improved after nebulizer treatment with resolution of wheezes and improved tidal volume after second albuterol and prednisone. Prednisone given in the ED and pt will be dc with 5 day burst. Pt states they are breathing at baseline.  Pt has been instructed to continue using prescribed medications and to speak with PCP about today's exacerbation. Patient has an appointment with her primary care physician in 5 days.  I have personally reviewed patient's vitals, nursing note and any pertinent labs or imaging.  I performed an focused physical exam; undressed when appropriate .    It has been determined that no acute conditions requiring further emergency intervention are present at this time. The patient/guardian have been advised of the diagnosis and plan. I reviewed any labs and imaging including any potential incidental findings. We have discussed signs and symptoms that  warrant return to the ED and they are listed in the discharge instructions.    Vital signs are stable at discharge.   BP 127/81 mmHg  Pulse 81  Temp(Src) 98.1 F (36.7 C) (Oral)  Resp 18  SpO2 95%  I personally performed the services described in this documentation, which was scribed in my presence. The recorded information has been reviewed and is accurate.    Dahlia Client Carnell Beavers, PA-C 06/10/14 2108  Flint Melter, MD 06/11/14 [phone]

## 2014-06-10 NOTE — Discharge Instructions (Signed)
1. Medications: Albuterol, Symbicort, prednisone, usual home medications 2. Treatment: rest, drink plenty of fluids,  3. Follow Up: Please followup with your primary doctor in 5 days at your already scheduled appointment for discussion of your diagnoses and further evaluation after today's visit; if you do not have a primary care doctor use the resource guide provided to find one; Please return to the ER for increased difficulty breathing, high fevers, persistent vomiting or other concerns.

## 2014-06-10 NOTE — ED Notes (Signed)
Pt sts out of inhaler and having problems with asthma

## 2014-06-14 ENCOUNTER — Inpatient Hospital Stay (HOSPITAL_COMMUNITY)
Admission: EM | Admit: 2014-06-14 | Discharge: 2014-06-16 | DRG: 203 | Disposition: A | Discharge status: Home / Self Care | Payer: Self-pay | Attending: Internal Medicine | Admitting: Internal Medicine

## 2014-06-14 ENCOUNTER — Encounter (HOSPITAL_COMMUNITY): Payer: Self-pay | Admitting: Emergency Medicine

## 2014-06-14 ENCOUNTER — Emergency Department (HOSPITAL_COMMUNITY): Payer: Self-pay

## 2014-06-14 ENCOUNTER — Emergency Department (HOSPITAL_COMMUNITY)
Admission: EM | Admit: 2014-06-14 | Discharge: 2014-06-14 | Disposition: A | Discharge status: Home / Self Care | Payer: Self-pay | Attending: Emergency Medicine | Admitting: Emergency Medicine

## 2014-06-14 ENCOUNTER — Encounter (HOSPITAL_COMMUNITY): Payer: Self-pay

## 2014-06-14 DIAGNOSIS — Z8709 Personal history of other diseases of the respiratory system: Secondary | ICD-10-CM

## 2014-06-14 DIAGNOSIS — Z7951 Long term (current) use of inhaled steroids: Secondary | ICD-10-CM | POA: Insufficient documentation

## 2014-06-14 DIAGNOSIS — Z7982 Long term (current) use of aspirin: Secondary | ICD-10-CM

## 2014-06-14 DIAGNOSIS — Z8679 Personal history of other diseases of the circulatory system: Secondary | ICD-10-CM | POA: Insufficient documentation

## 2014-06-14 DIAGNOSIS — Z79899 Other long term (current) drug therapy: Secondary | ICD-10-CM | POA: Insufficient documentation

## 2014-06-14 DIAGNOSIS — Z792 Long term (current) use of antibiotics: Secondary | ICD-10-CM | POA: Insufficient documentation

## 2014-06-14 DIAGNOSIS — Z8739 Personal history of other diseases of the musculoskeletal system and connective tissue: Secondary | ICD-10-CM | POA: Insufficient documentation

## 2014-06-14 DIAGNOSIS — Z91013 Allergy to seafood: Secondary | ICD-10-CM

## 2014-06-14 DIAGNOSIS — Z720 Tobacco use: Secondary | ICD-10-CM | POA: Insufficient documentation

## 2014-06-14 DIAGNOSIS — J45901 Unspecified asthma with (acute) exacerbation: Secondary | ICD-10-CM | POA: Insufficient documentation

## 2014-06-14 DIAGNOSIS — Z7952 Long term (current) use of systemic steroids: Secondary | ICD-10-CM | POA: Insufficient documentation

## 2014-06-14 MED ORDER — ALBUTEROL SULFATE (2.5 MG/3ML) 0.083% IN NEBU
5.0000 mg | INHALATION_SOLUTION | Freq: Once | RESPIRATORY_TRACT | Status: AC
  Administered 2014-06-14: 5 mg via RESPIRATORY_TRACT
  Filled 2014-06-14: qty 6

## 2014-06-14 MED ORDER — HYDROCODONE-ACETAMINOPHEN 5-325 MG PO TABS
1.0000 | ORAL_TABLET | Freq: Once | ORAL | Status: AC
  Administered 2014-06-14: 1 via ORAL
  Filled 2014-06-14: qty 1

## 2014-06-14 MED ORDER — IBUPROFEN 400 MG PO TABS
400.0000 mg | ORAL_TABLET | Freq: Once | ORAL | Status: AC
  Administered 2014-06-14: 400 mg via ORAL
  Filled 2014-06-14: qty 1

## 2014-06-14 MED ORDER — IPRATROPIUM BROMIDE 0.02 % IN SOLN
0.5000 mg | Freq: Once | RESPIRATORY_TRACT | Status: AC
  Administered 2014-06-14: 0.5 mg via RESPIRATORY_TRACT
  Filled 2014-06-14: qty 2.5

## 2014-06-14 MED ORDER — PREDNISONE 20 MG PO TABS
60.0000 mg | ORAL_TABLET | Freq: Once | ORAL | Status: AC
  Administered 2014-06-14: 60 mg via ORAL
  Filled 2014-06-14: qty 3

## 2014-06-14 MED ORDER — FAMOTIDINE 20 MG PO TABS
20.0000 mg | ORAL_TABLET | Freq: Once | ORAL | Status: AC
  Administered 2014-06-14: 20 mg via ORAL
  Filled 2014-06-14: qty 1

## 2014-06-14 MED ORDER — LORAZEPAM 1 MG PO TABS
1.0000 mg | ORAL_TABLET | Freq: Once | ORAL | Status: AC
  Administered 2014-06-14: 1 mg via ORAL
  Filled 2014-06-14: qty 1

## 2014-06-14 MED ORDER — ALBUTEROL SULFATE (2.5 MG/3ML) 0.083% IN NEBU
INHALATION_SOLUTION | RESPIRATORY_TRACT | Status: AC
  Filled 2014-06-14: qty 6

## 2014-06-14 MED ORDER — PREDNISONE 20 MG PO TABS: 60.0000 mg | ORAL_TABLET | Freq: Every day | ORAL | Status: DC

## 2014-06-14 MED ORDER — ALBUTEROL (5 MG/ML) CONTINUOUS INHALATION SOLN
10.0000 mg/h | INHALATION_SOLUTION | RESPIRATORY_TRACT | Status: AC
  Administered 2014-06-14: 10 mg/h via RESPIRATORY_TRACT
  Filled 2014-06-14: qty 20

## 2014-06-14 MED ORDER — ALBUTEROL SULFATE HFA 108 (90 BASE) MCG/ACT IN AERS: 2.0000 | INHALATION_SPRAY | RESPIRATORY_TRACT | Status: DC | PRN

## 2014-06-14 MED ORDER — GI COCKTAIL ~~LOC~~
30.0000 mL | Freq: Once | ORAL | Status: AC
  Administered 2014-06-14: 30 mL via ORAL
  Filled 2014-06-14: qty 30

## 2014-06-14 MED ORDER — ALBUTEROL SULFATE (2.5 MG/3ML) 0.083% IN NEBU
5.0000 mg | INHALATION_SOLUTION | Freq: Once | RESPIRATORY_TRACT | Status: AC
  Administered 2014-06-14: 5 mg via RESPIRATORY_TRACT

## 2014-06-14 NOTE — ED Notes (Signed)
RT called. Pt placed on cardiac monitor.

## 2014-06-14 NOTE — ED Notes (Signed)
Treatment complete.

## 2014-06-14 NOTE — ED Notes (Signed)
Removed c collar.

## 2014-06-14 NOTE — ED Notes (Addendum)
Pt seen here earlier today for asthma. sts once she got home wheezing became worse and now having upper chest tightness and "contractions" pt with insp and exp wheezing. Pt has been using albuterol and Simbicort inhaler multiple times since she was sent home.

## 2014-06-14 NOTE — ED Provider Notes (Addendum)
CSN: [id number]     Arrival date & time 06/14/14  1940 History   First MD Initiated Contact with Patient 06/14/14 2022     Chief Complaint  Patient presents with  . Asthma     (Consider location/radiation/quality/duration/timing/severity/associated sxs/prior Treatment) Patient is a 24 y.o. female presenting with asthma. The history is provided by the patient.  Asthma Associated symptoms include shortness of breath. Pertinent negatives include no chest pain, no abdominal pain and no headaches.  pt with hx asthma, c/o wheezing.  Onset a couple days ago. Was seen in ED earlier today w same. Symptoms moderate. Mild relief w mdi use. Denies chest pain. Occasional non prod cough. No sore throat or other uri c/o. No fever or chills. No leg pain or swelling.     Past Medical History  Diagnosis Date  . Preeclampsia   . Asthma   . Migraine     "not often anymore" (02/22/2014)  . Arthritis     "fingers" (02/22/2014)   Past Surgical History  Procedure Laterality Date  . Cholecystectomy  2014  . Tonsillectomy  ~ 2008  . Tonsillectomy/adenoidectomy/turbinate reduction  ~ 2008   Family History  Problem Relation Age of Onset  . Anesthesia problems Neg Hx    History  Substance Use Topics  . Smoking status: Current Some Day Smoker -- 4 years    Types: Cigarettes    Last Attempt to Quit: 10/06/2013  . Smokeless tobacco: Never Used  . Alcohol Use: Yes     Comment: 02/22/2014 "maybe once/month"   OB History    Gravida Para Term Preterm AB TAB SAB Ectopic Multiple Living   0 0 0 0 0 0 2      Obstetric Comments   2 children, one boy and girl     Review of Systems  Constitutional: Negative for fever and chills.  HENT: Negative for rhinorrhea and sore throat.   Eyes: Negative for redness.  Respiratory: Positive for cough, shortness of breath and wheezing.   Cardiovascular: Negative for chest pain and leg swelling.  Gastrointestinal: Negative for vomiting and abdominal pain.   Genitourinary: Negative for flank pain.  Musculoskeletal: Negative for back pain and neck pain.  Skin: Negative for rash.  Neurological: Negative for headaches.  Hematological: Does not bruise/bleed easily.  Psychiatric/Behavioral: Negative for confusion.      Allergies  Fish allergy  Home Medications   Prior to Admission medications   Medication Sig Start Date End Date Taking? Authorizing Provider  albuterol (PROVENTIL HFA;VENTOLIN HFA) 108 (90 BASE) MCG/ACT inhaler Inhale 2 puffs into the lungs every 4 (four) hours as needed for wheezing or shortness of breath. 06/10/14   Hannah Muthersbaugh, PA-C  budesonide-formoterol (SYMBICORT) 160-4.5 MCG/ACT inhaler Inhale 2 puffs into the lungs 2 (two) times daily. 06/10/14   Hannah Muthersbaugh, PA-C  doxycycline (VIBRAMYCIN) 50 MG capsule Take 2 capsules (100 mg total) by mouth 2 (two) times daily. 02/23/14   Shanker Levora Dredge, MD  guaiFENesin-dextromethorphan (ROBITUSSIN DM) 100-10 MG/5ML syrup Take 5 mLs by mouth every 4 (four) hours as needed for cough. 02/23/14   Shanker Levora Dredge, MD  levonorgestrel (MIRENA) 20 MCG/24HR IUD 1 each by Intrauterine route continuous.     Historical Provider, MD  predniSONE (DELTASONE) 10 MG tablet Take 4 tablets (40 mg) daily for 2 days, then, Take 3 tablets (30 mg) daily for 2 days, then, Take 2 tablets (20 mg) daily for 2 days, then, Take 1 tablets (10 mg) daily for  1 days, then stop 06/10/14   Dahlia ClientHannah Muthersbaugh, PA-C   BP 142/91 mmHg  Pulse 110  Temp(Src) 98 F (36.7 C) (Oral)  Resp 20  SpO2 95% Physical Exam  Constitutional: She is oriented to person, place, and time. She appears well-developed and well-nourished. No distress.  HENT:  Nose: Nose normal.  Mouth/Throat: Oropharynx is clear and moist.  Eyes: Conjunctivae are normal. No scleral icterus.  Neck: Neck supple. No JVD present. No tracheal deviation present.  Cardiovascular: Normal rate, regular rhythm, normal heart sounds and intact distal  pulses.   Pulmonary/Chest: Effort normal and breath sounds normal. No respiratory distress.  Abdominal: Soft. Normal appearance and bowel sounds are normal. She exhibits no distension. There is no tenderness. There is no rebound.  Musculoskeletal: She exhibits no edema or tenderness.  Neurological: She is alert and oriented to person, place, and time.  Skin: Skin is warm and dry. No rash noted.  Psychiatric: She has a normal mood and affect.  Nursing note and vitals reviewed.   ED Course  Procedures (including critical care time) Labs Review   Dg Chest 2 View (if Patient Has Fever And/or Copd)  06/14/2014   CLINICAL DATA:  Severe shortness of breath, chest tightness, history of asthma  EXAM: CHEST  2 VIEW  COMPARISON:  Chest x-ray of 02/21/2014  FINDINGS: No pneumonia is seen. There is some peribronchial thickening which may indicate bronchitis. Mediastinal and hilar contours are unremarkable. The heart is within normal limits in size. No bony abnormality is noted. Surgical clips are present in the right upper quadrant from prior cholecystectomy.  IMPRESSION: No pneumonia.  Peribronchial thickening may indicate bronchitis.   Electronically Signed   By: Dwyane DeePaul  Barry M.D.   On: 06/14/2014 13:27       MDM   Iv ns. Labs.  Reviewed nursing notes and prior charts for additional history.   Pt received pre 60 mg po earlier this afternoon.    Wheezing.  Albuterol and atrovent neb.  Reviewed nursing notes and prior charts for additional history.   Recheck persistent wheezing, improved air exchange.  Pt feels improved from prior however wheezing persists.  Additional albuterol and atrovent neb.  Signed out to oncoming provider, Dr Preston FleetingGlick, labs pending (in event doesn't improve w txs and requires admission), and that pt improving but not yet ready for d/c.   Will check labs and reassess post additional neb tx.  Given 2nd ed visit today w persistent wheezing - feel likely will need admit - labs  pending.      Suzi RootsKevin E Joanne Salah, MD 06/15/14 [phone]

## 2014-06-14 NOTE — ED Notes (Signed)
Pt ambulated to bathroom 

## 2014-06-14 NOTE — Discharge Instructions (Signed)
Fill your prescriptions from previous visit and take as directed. Return to the ED with worsening or concerning symptoms.

## 2014-06-14 NOTE — ED Notes (Signed)
Pts son given teddy grams and apple juice,

## 2014-06-14 NOTE — ED Notes (Signed)
Pt was seen on Saturday for asthma. Has appt tomorrow but feels like she needed to be seen again. Pt is holding onto albuterol Rx until tomorrow to see if they switch her. Has been using her inhaler given to her Saturday. No relief./

## 2014-06-14 NOTE — Discharge Instructions (Signed)
It was our pleasure to provide your ER care today - we hope that you feel better.  Use albuterol inhaler as need for wheezing.  Take prednisone as prescribed.  Follow up with primary care doctor for recheck in the next 1-2 days.  Return to ER if worse, new symptoms, chest pain, trouble breathing, other concern.  You were given pain medication in the ER - no driving for the next 4 hours.    Asthma Asthma is a recurring condition in which the airways tighten and narrow. Asthma can make it difficult to breathe. It can cause coughing, wheezing, and shortness of breath. Asthma episodes, also called asthma attacks, range from minor to life-threatening. Asthma cannot be cured, but medicines and lifestyle changes can help control it. CAUSES Asthma is believed to be caused by inherited (genetic) and environmental factors, but its exact cause is unknown. Asthma may be triggered by allergens, lung infections, or irritants in the air. Asthma triggers are different for each person. Common triggers include:   Animal dander.  Dust mites.  Cockroaches.  Pollen from trees or grass.  Mold.  Smoke.  Air pollutants such as dust, household cleaners, hair sprays, aerosol sprays, paint fumes, strong chemicals, or strong odors.  Cold air, weather changes, and winds (which increase molds and pollens in the air).  Strong emotional expressions such as crying or laughing hard.  Stress.  Certain medicines (such as aspirin) or types of drugs (such as beta-blockers).  Sulfites in foods and drinks. Foods and drinks that may contain sulfites include dried fruit, potato chips, and sparkling grape juice.  Infections or inflammatory conditions such as the flu, a cold, or an inflammation of the nasal membranes (rhinitis).  Gastroesophageal reflux disease (GERD).  Exercise or strenuous activity. SYMPTOMS Symptoms may occur immediately after asthma is triggered or many hours later. Symptoms  include:  Wheezing.  Excessive nighttime or early morning coughing.  Frequent or severe coughing with a common cold.  Chest tightness.  Shortness of breath. DIAGNOSIS  The diagnosis of asthma is made by a review of your medical history and a physical exam. Tests may also be performed. These may include:  Lung function studies. These tests show how much air you breathe in and out.  Allergy tests.  Imaging tests such as X-rays. TREATMENT  Asthma cannot be cured, but it can usually be controlled. Treatment involves identifying and avoiding your asthma triggers. It also involves medicines. There are 2 classes of medicine used for asthma treatment:   Controller medicines. These prevent asthma symptoms from occurring. They are usually taken every day.  Reliever or rescue medicines. These quickly relieve asthma symptoms. They are used as needed and provide short-term relief. Your health care provider will help you create an asthma action plan. An asthma action plan is a written plan for managing and treating your asthma attacks. It includes a list of your asthma triggers and how they may be avoided. It also includes information on when medicines should be taken and when their dosage should be changed. An action plan may also involve the use of a device called a peak flow meter. A peak flow meter measures how well the lungs are working. It helps you monitor your condition. HOME CARE INSTRUCTIONS   Take medicines only as directed by your health care provider. Speak with your health care provider if you have questions about how or when to take the medicines.  Use a peak flow meter as directed by your health care provider.  Record and keep track of readings.  Understand and use the action plan to help minimize or stop an asthma attack without needing to seek medical care.  Control your home environment in the following ways to help prevent asthma attacks:  Do not smoke. Avoid being exposed to  secondhand smoke.  Change your heating and air conditioning filter regularly.  Limit your use of fireplaces and wood stoves.  Get rid of pests (such as roaches and mice) and their droppings.  Throw away plants if you see mold on them.  Clean your floors and dust regularly. Use unscented cleaning products.  Try to have someone else vacuum for you regularly. Stay out of rooms while they are being vacuumed and for a short while afterward. If you vacuum, use a dust mask from a hardware store, a double-layered or microfilter vacuum cleaner bag, or a vacuum cleaner with a HEPA filter.  Replace carpet with wood, tile, or vinyl flooring. Carpet can trap dander and dust.  Use allergy-proof pillows, mattress covers, and box spring covers.  Wash bed sheets and blankets every week in hot water and dry them in a dryer.  Use blankets that are made of polyester or cotton.  Clean bathrooms and kitchens with bleach. If possible, have someone repaint the walls in these rooms with mold-resistant paint. Keep out of the rooms that are being cleaned and painted.  Wash hands frequently. SEEK MEDICAL CARE IF:   You have wheezing, shortness of breath, or a cough even if taking medicine to prevent attacks.  The colored mucus you cough up (sputum) is thicker than usual.  Your sputum changes from clear or white to yellow, green, gray, or bloody.  You have any problems that may be related to the medicines you are taking (such as a rash, itching, swelling, or trouble breathing).  You are using a reliever medicine more than 2-3 times per week.  Your peak flow is still at 50-79% of your personal best after following your action plan for 1 hour.  You have a fever. SEEK IMMEDIATE MEDICAL CARE IF:   You seem to be getting worse and are unresponsive to treatment during an asthma attack.  You are short of breath even at rest.  You get short of breath when doing very little physical activity.  You have  difficulty eating, drinking, or talking due to asthma symptoms.  You develop chest pain.  You develop a fast heartbeat.  You have a bluish color to your lips or fingernails.  You are light-headed, dizzy, or faint.  Your peak flow is less than 50% of your personal best. MAKE SURE YOU:   Understand these instructions.  Will watch your condition.  Will get help right away if you are not doing well or get worse. Document Released: 05/27/2005 Document Revised: 10/11/2013 Document Reviewed: 12/24/2012 [facility] Patient Information 2015 [employer], [place]. This information is not intended to replace advice given to you by your health care provider. Make sure you discuss any questions you have with your health care provider.

## 2014-06-14 NOTE — ED Provider Notes (Signed)
CSN: [id number]     Arrival date & time 06/14/14  1156 History  This chart was scribed for non-physician practitioner, Emilia BeckKaitlyn Mackensey Bolte, PA-C, working with Raeford RazorStephen Kohut, MD, by Ronney LionSuzanne Le, ED Scribe. This patient was seen in room TR11C/TR11C and the patient's care was started at 1:14 PM.    Chief Complaint  Patient presents with  . Asthma   The history is provided by the patient. No language interpreter was used.     HPI Comments: Catherine Porter is a 24 y.o. female with a history of asthma who presents to the Emergency Department complaining of SOB. She complains of associated cough accompanied by chest and back tightness, and states she has started feeling more of a need to use her albuterol inhaler. Patient was seen about 4 days ago for the same symptoms and she had received prescription medication, including for Prednisone, but she has only filled albuterol. She states she has a doctor's appointment scheduled for tomorrow.   Past Medical History  Diagnosis Date  . Preeclampsia   . Asthma   . Migraine     "not often anymore" (02/22/2014)  . Arthritis     "fingers" (02/22/2014)   Past Surgical History  Procedure Laterality Date  . Cholecystectomy  2014  . Tonsillectomy  ~ 2008  . Tonsillectomy/adenoidectomy/turbinate reduction  ~ 2008   Family History  Problem Relation Age of Onset  . Anesthesia problems Neg Hx    History  Substance Use Topics  . Smoking status: Current Some Day Smoker -- 4 years    Types: Cigarettes    Last Attempt to Quit: 10/06/2013  . Smokeless tobacco: Never Used  . Alcohol Use: Yes     Comment: 02/22/2014 "maybe once/month"   OB History    Gravida Para Term Preterm AB TAB SAB Ectopic Multiple Living   2 2 2  0 0 0 0 0 0 2      Obstetric Comments   2 children, one boy and girl     Review of Systems  Respiratory: Positive for cough, chest tightness and shortness of breath.   All other systems reviewed and are negative.     Allergies  Fish  allergy  Home Medications   Prior to Admission medications   Medication Sig Start Date End Date Taking? Authorizing Provider  albuterol (PROVENTIL HFA;VENTOLIN HFA) 108 (90 BASE) MCG/ACT inhaler Inhale 2 puffs into the lungs every 4 (four) hours as needed for wheezing or shortness of breath. 06/10/14   Hannah Muthersbaugh, PA-C  budesonide-formoterol (SYMBICORT) 160-4.5 MCG/ACT inhaler Inhale 2 puffs into the lungs 2 (two) times daily. 06/10/14   Hannah Muthersbaugh, PA-C  doxycycline (VIBRAMYCIN) 50 MG capsule Take 2 capsules (100 mg total) by mouth 2 (two) times daily. 02/23/14   Shanker Levora DredgeM Ghimire, MD  guaiFENesin-dextromethorphan (ROBITUSSIN DM) 100-10 MG/5ML syrup Take 5 mLs by mouth every 4 (four) hours as needed for cough. 02/23/14   Shanker Levora DredgeM Ghimire, MD  levonorgestrel (MIRENA) 20 MCG/24HR IUD 1 each by Intrauterine route continuous.     Historical Provider, MD  predniSONE (DELTASONE) 10 MG tablet Take 4 tablets (40 mg) daily for 2 days, then, Take 3 tablets (30 mg) daily for 2 days, then, Take 2 tablets (20 mg) daily for 2 days, then, Take 1 tablets (10 mg) daily for 1 days, then stop 06/10/14   Dahlia ClientHannah Muthersbaugh, PA-C   BP 103/77 mmHg  Pulse 105  Temp(Src) 97.8 F (36.6 C) (Oral)  Resp 22  Ht 5\' 4"  (1.626  m)  Wt 195 lb (88.451 kg)  BMI 33.46 kg/m2  SpO2 97% Physical Exam  Constitutional: She is oriented to person, place, and time. She appears well-developed and well-nourished. No distress.  HENT:  Head: Normocephalic and atraumatic.  Eyes: Conjunctivae and EOM are normal.  Neck: Neck supple. No tracheal deviation present.  Cardiovascular: Normal rate.   Pulmonary/Chest: Effort normal. No respiratory distress. She has wheezes.  Inspiratory and expiratory wheezing in all lung fields.   Abdominal: Soft. She exhibits no distension. There is no tenderness.  Musculoskeletal: Normal range of motion.  Neurological: She is alert and oriented to person, place, and time.  Skin: Skin is  warm and dry.  Psychiatric: She has a normal mood and affect. Her behavior is normal.  Nursing note and vitals reviewed.   ED Course  Procedures (including critical care time)  DIAGNOSTIC STUDIES: Oxygen Saturation is 97% on room air, normal by my interpretation.    COORDINATION OF CARE: 1:19 PM - Discussed treatment plan with pt at bedside which includes nebulizer treatment and pt agreed to plan.  Imaging Review Dg Chest 2 View (if Patient Has Fever And/or Copd)  06/14/2014   CLINICAL DATA:  Severe shortness of breath, chest tightness, history of asthma  EXAM: CHEST  2 VIEW  COMPARISON:  Chest x-ray of 02/21/2014  FINDINGS: No pneumonia is seen. There is some peribronchial thickening which may indicate bronchitis. Mediastinal and hilar contours are unremarkable. The heart is within normal limits in size. No bony abnormality is noted. Surgical clips are present in the right upper quadrant from prior cholecystectomy.  IMPRESSION: No pneumonia.  Peribronchial thickening may indicate bronchitis.   Electronically Signed   By: Dwyane Dee M.D.   On: 06/14/2014 13:27    MDM   Final diagnoses:  Asthma exacerbation    3:02 PM Chest xray unremarkable for acute bacterial infection. Patient's lung sound improved after hour long nebulizer treatment with albuterol and atrovent. Patient has prescriptions for prednisone and symbicort at home. Patient instructed to get her prescriptions filled. Patient has an appointment with her PCP tomorrow. Patient instructed to return with worsening or concerning symptoms.   I personally performed the services described in this documentation, which was scribed in my presence. The recorded information has been reviewed and is accurate.   Emilia Beck, PA-C 06/14/14 1604  Raeford Razor, MD 06/15/14 [phone]

## 2014-06-14 NOTE — ED Notes (Signed)
Pt reporting pain in center of chest with radiation to back. EDP made aware.

## 2014-06-15 ENCOUNTER — Ambulatory Visit: Payer: Self-pay | Admitting: Internal Medicine

## 2014-06-15 ENCOUNTER — Other Ambulatory Visit: Payer: Self-pay

## 2014-06-15 ENCOUNTER — Encounter (HOSPITAL_COMMUNITY): Payer: Self-pay | Admitting: Internal Medicine

## 2014-06-15 DIAGNOSIS — J45901 Unspecified asthma with (acute) exacerbation: Principal | ICD-10-CM

## 2014-06-15 LAB — BASIC METABOLIC PANEL
ANION GAP: 14 (ref 5–15)
Anion gap: 10 (ref 5–15)
BUN: 10 mg/dL (ref 6–23)
BUN: 10 mg/dL (ref 6–23)
CALCIUM: 9.7 mg/dL (ref 8.4–10.5)
CO2: 24 mmol/L (ref 19–32)
CO2: 20 mmol/L (ref 19–32)
CREATININE: 0.83 mg/dL (ref 0.50–1.10)
Calcium: 9.1 mg/dL (ref 8.4–10.5)
Chloride: 103 mEq/L (ref 96–112)
Chloride: 104 mEq/L (ref 96–112)
Creatinine, Ser: 0.59 mg/dL (ref 0.50–1.10)
GFR calc non Af Amer: >90 mL/min (ref >90)
Glucose, Bld: 112 mg/dL — ABNORMAL HIGH (ref 70–99)
Glucose, Bld: 137 mg/dL — ABNORMAL HIGH (ref 70–99)
POTASSIUM: 3.5 mmol/L (ref 3.5–5.1)
Potassium: 4.1 mmol/L (ref 3.5–5.1)
SODIUM: 137 mmol/L (ref 135–145)
Sodium: 138 mmol/L (ref 135–145)

## 2014-06-15 LAB — CBC
HCT: 46 % (ref 36.0–46.0)
HEMATOCRIT: 41.8 % (ref 36.0–46.0)
HEMOGLOBIN: 15.9 g/dL — AB (ref 12.0–15.0)
Hemoglobin: 14.1 g/dL (ref 12.0–15.0)
MCH: 29.7 pg (ref 26.0–34.0)
MCH: 31 pg (ref 26.0–34.0)
MCHC: 33.7 g/dL (ref 30.0–36.0)
MCHC: 34.6 g/dL (ref 30.0–36.0)
MCV: 88.2 fL (ref 78.0–100.0)
MCV: 89.7 fL (ref 78.0–100.0)
Platelets: 237 10*3/uL (ref 150–400)
Platelets: 251 10*3/uL (ref 150–400)
RBC: 4.74 MIL/uL (ref 3.87–5.11)
RBC: 5.13 MIL/uL — ABNORMAL HIGH (ref 3.87–5.11)
RDW: 12.8 % (ref 11.5–15.5)
RDW: 12.6 % (ref 11.5–15.5)
WBC: 10.4 10*3/uL (ref 4.0–10.5)
WBC: 11.4 10*3/uL — ABNORMAL HIGH (ref 4.0–10.5)

## 2014-06-15 LAB — D-DIMER, QUANTITATIVE: D-Dimer, Quant: 0.4 ug/mL-FEU (ref 0.00–0.48)

## 2014-06-15 LAB — TROPONIN I: Troponin I: <0.03 ng/mL (ref <0.031)

## 2014-06-15 MED ORDER — IPRATROPIUM-ALBUTEROL 0.5-2.5 (3) MG/3ML IN SOLN: 3.0000 mL | RESPIRATORY_TRACT | Status: DC | PRN

## 2014-06-15 MED ORDER — ACETAMINOPHEN 650 MG RE SUPP: 650.0000 mg | Freq: Four times a day (QID) | RECTAL | Status: DC | PRN

## 2014-06-15 MED ORDER — ALBUTEROL SULFATE (2.5 MG/3ML) 0.083% IN NEBU: 2.5000 mg | INHALATION_SOLUTION | RESPIRATORY_TRACT | Status: DC | PRN

## 2014-06-15 MED ORDER — METHYLPREDNISOLONE SODIUM SUCC 40 MG IJ SOLR
40.0000 mg | Freq: Two times a day (BID) | INTRAMUSCULAR | Status: DC
  Administered 2014-06-15 – 2014-06-16 (×2): 40 mg via INTRAVENOUS
  Filled 2014-06-15 (×4): qty 1

## 2014-06-15 MED ORDER — ENOXAPARIN SODIUM 40 MG/0.4ML ~~LOC~~ SOLN
40.0000 mg | SUBCUTANEOUS | Status: DC
  Administered 2014-06-15 – 2014-06-16 (×2): 40 mg via SUBCUTANEOUS
  Filled 2014-06-15 (×2): qty 0.4

## 2014-06-15 MED ORDER — SODIUM CHLORIDE 0.9 % IJ SOLN
3.0000 mL | Freq: Two times a day (BID) | INTRAMUSCULAR | Status: DC
  Administered 2014-06-15 – 2014-06-16 (×3): 3 mL via INTRAVENOUS

## 2014-06-15 MED ORDER — GUAIFENESIN ER 600 MG PO TB12
600.0000 mg | ORAL_TABLET | Freq: Two times a day (BID) | ORAL | Status: DC
  Administered 2014-06-15 – 2014-06-16 (×3): 600 mg via ORAL
  Filled 2014-06-15 (×4): qty 1

## 2014-06-15 MED ORDER — IPRATROPIUM-ALBUTEROL 0.5-2.5 (3) MG/3ML IN SOLN
3.0000 mL | Freq: Four times a day (QID) | RESPIRATORY_TRACT | Status: DC
Start: 2014-06-15 — End: 2014-06-16
  Administered 2014-06-15 – 2014-06-16 (×5): 3 mL via RESPIRATORY_TRACT
  Filled 2014-06-15 (×5): qty 3

## 2014-06-15 MED ORDER — IPRATROPIUM-ALBUTEROL 0.5-2.5 (3) MG/3ML IN SOLN: 3.0000 mL | Freq: Two times a day (BID) | RESPIRATORY_TRACT | Status: DC

## 2014-06-15 MED ORDER — METHYLPREDNISOLONE SODIUM SUCC 40 MG IJ SOLR
40.0000 mg | Freq: Every day | INTRAMUSCULAR | Status: DC
  Administered 2014-06-15: 40 mg via INTRAVENOUS
  Filled 2014-06-15: qty 1

## 2014-06-15 MED ORDER — ONDANSETRON HCL 4 MG/2ML IJ SOLN: 4.0000 mg | Freq: Four times a day (QID) | INTRAMUSCULAR | Status: DC | PRN

## 2014-06-15 MED ORDER — ACETAMINOPHEN 325 MG PO TABS
650.0000 mg | ORAL_TABLET | Freq: Four times a day (QID) | ORAL | Status: DC | PRN
  Administered 2014-06-15: 650 mg via ORAL
  Filled 2014-06-15: qty 2

## 2014-06-15 MED ORDER — BUDESONIDE 0.25 MG/2ML IN SUSP
0.2500 mg | Freq: Two times a day (BID) | RESPIRATORY_TRACT | Status: DC
  Administered 2014-06-15 – 2014-06-16 (×3): 0.25 mg via RESPIRATORY_TRACT
  Filled 2014-06-15 (×5): qty 2

## 2014-06-15 MED ORDER — ONDANSETRON HCL 4 MG PO TABS: 4.0000 mg | ORAL_TABLET | Freq: Four times a day (QID) | ORAL | Status: DC | PRN

## 2014-06-15 MED ORDER — ALBUTEROL SULFATE (2.5 MG/3ML) 0.083% IN NEBU
2.5000 mg | INHALATION_SOLUTION | RESPIRATORY_TRACT | Status: DC
  Administered 2014-06-15: 2.5 mg via RESPIRATORY_TRACT
  Filled 2014-06-15: qty 3

## 2014-06-15 NOTE — ED Provider Notes (Signed)
Patient initially seen and evaluated by Dr.Steinl,  Presented to the ED with ongoing asthma symptoms after being treated earlier in the day. After 3 nebulizer treatments in the ED, she still had inspiratory neck surgery wheezes present. This is in spite of 2 ED visits and getting treated  With steroids on both occasions. She is considered for failed outpatient treatment and is going to be admitted. Case is discussed with Dr. Toniann FailKakrakandy  [facility] who agrees to admit the patient.  Dione Boozeavid Trayquan Kolakowski, MD 06/15/14 [phone]

## 2014-06-15 NOTE — Progress Notes (Signed)
[facility] PROGRESS NOTE  Catherine CurtDaynez O Porter ZOX:[id number] DOB: 06/30/1990 DOA: 06/14/2014 PCP: Catherine Porter, OLUGBEMIGA, Catherine Porter  Assessment/Plan: 1-Asthma exacerbation; continue with nebulizer , solumedrol.  Check influenza panel. Sick contact, fevers at home/  \ 2-chest pain; pleuritic, in setting of asthma exacerbation improved. D dimer negative. Troponin negative. Incentive spirometry ordered.   Code Status: full code.  Family Communication: care discussed with patient.  Disposition Plan: remain inpatient,    Consultants:  none  Procedures:  none  Antibiotics:  none  HPI/Subjective: Breathing better. Feels sleepy.    Objective: Filed Vitals:   06/15/14 0831  BP: 123/78  Pulse: 90  Temp: 97.8 F (36.6 C)  Resp: 16   No intake or output data in the 24 hours ending 06/15/14 0944 Filed Weights   06/15/14 0312  Weight: 91.4 kg (201 lb 8 oz)    Exam:   General:  Alert in no distress.   Cardiovascular: S 1, S 2 RRR  Respiratory: bilateral ronchus and wheezing.   Abdomen: BS present, soft, nt  Musculoskeletal: none  Data Reviewed: Basic Metabolic Panel:  Recent Labs Lab 06/14/14 2354 06/15/14 0549  NA 137 138  K 4.1 3.5  CL 103 104  CO2 20 24  GLUCOSE 137* 112*  BUN 10 10  CREATININE 0.83 0.59  CALCIUM 9.7 9.1   Liver Function Tests: No results for input(s): AST, ALT, ALKPHOS, BILITOT, PROT, ALBUMIN in the last 168 hours. No results for input(s): LIPASE, AMYLASE in the last 168 hours. No results for input(s): AMMONIA in the last 168 hours. CBC:  Recent Labs Lab 06/14/14 2354 06/15/14 0549  WBC 10.4 11.4*  HGB 15.9* 14.1  HCT 46.0 41.8  MCV 89.7 88.2  PLT 251 237   Cardiac Enzymes:  Recent Labs Lab 06/15/14 0549  TROPONINI <0.03   BNP (last 3 results) No results for input(s): PROBNP in the last 8760 hours. CBG: No results for input(s): GLUCAP in the last 168 hours.  No results found for this or any previous visit (from the  past 240 hour(s)).   Studies: Dg Chest 2 View (if Patient Has Fever And/or Copd)  06/14/2014   CLINICAL DATA:  Severe shortness of breath, chest tightness, history of asthma  EXAM: CHEST  2 VIEW  COMPARISON:  Chest x-ray of 02/21/2014  FINDINGS: No pneumonia is seen. There is some peribronchial thickening which may indicate bronchitis. Mediastinal and hilar contours are unremarkable. The heart is within normal limits in size. No bony abnormality is noted. Surgical clips are present in the right upper quadrant from prior cholecystectomy.  IMPRESSION: No pneumonia.  Peribronchial thickening may indicate bronchitis.   Electronically Signed   By: Dwyane DeePaul  Barry M.D.   On: 06/14/2014 13:27    Scheduled Meds: . budesonide (PULMICORT) nebulizer solution  0.25 mg Nebulization BID  . enoxaparin (LOVENOX) injection  40 mg Subcutaneous Q24H  . guaiFENesin  600 mg Oral BID  . ipratropium-albuterol  3 mL Nebulization Q6H  . methylPREDNISolone (SOLU-MEDROL) injection  40 mg Intravenous Q12H  . sodium chloride  3 mL Intravenous Q12H   Continuous Infusions:   Principal Problem:   Asthma exacerbation    Time spent: 35 minutes.     Catherine Porter, Yazleen Molock A  [facility] Pager [phone]. If 7PM-7AM, please contact night-coverage at [email or web address], password [email or web address] 06/15/2014, 9:44 AM  LOS: 1 day

## 2014-06-15 NOTE — Progress Notes (Signed)
New Admission Note:  Arrival Method: via wheelchair with nurse tech Mental Orientation: Alert and orientedx4 Telemetry: n/a Assessment: Completed Skin: dry and intact Pain: denies any pain Tubes: n/a Safety Measures: Safety Fall Prevention Plan was given, discussed and signed. Admission: Completed [address] [facility] Orientation: Patient has been orientated to the room, unit and the staff. Family: none at bedside  Orders have been reviewed and implemented. Will continue to monitor the patient. Call light has been placed within reach and bed alarm has been activated.   Tempie DonningMercy Pascuala Klutts BSN, RN  Phone Number: [phone] [facility] [place]

## 2014-06-15 NOTE — H&P (Signed)
[facility] History and Physical  Catherine Porter ZOX:[id number] DOB: 15-May-1991 DOA: 06/14/2014  Referring physician: ER physician. PCP: Jeanann Lewandowsky, MD   Chief Complaint: Shortness of breath. Chest pain.  HPI: Catherine Porter is a 24 y.o. female with history of bronchial asthma presents with the ER because of shortness of breath and some chest pain. Patient had come to the ER earlier in the day yesterday and was given nebulizer treatment and discharge home. The patient is home patient started developing some pleuritic type of chest pain on deep breathing. Patient also has been having wheezing. In the ER chest x-ray shows bronchitis pattern. Patient has been admitted for asthma exacerbation. Patient states her chest pain is largely resolved at this time. Denies any nausea vomiting abdominal pain diarrhea fever chills. Has been having some productive cough.   Review of Systems: As presented in the history of presenting illness, rest negative.  Past Medical History  Diagnosis Date  . Asthma   . Migraine     "not often anymore" (02/22/2014)  . Arthritis     "fingers" (02/22/2014)   Past Surgical History  Procedure Laterality Date  . Cholecystectomy  2014  . Tonsillectomy  ~ 2008  . Tonsillectomy/adenoidectomy/turbinate reduction  ~ 2008   Social History:  reports that she has been smoking Cigarettes.  She has been smoking about 0.00 packs per day for the past 4 years. She has never used smokeless tobacco. She reports that she drinks alcohol. She reports that she does not use illicit drugs. Where does patient live home. Can patient participate in ADLs? Yes.  Allergies  Allergen Reactions  . Fish Allergy Swelling    All Seafood also    Family History:  Family History  Problem Relation Age of Onset  . Anesthesia problems Neg Hx   . Asthma Neg Hx       Prior to Admission medications   Medication Sig Start Date End Date Taking? Authorizing Provider  albuterol  (PROVENTIL HFA;VENTOLIN HFA) 108 (90 BASE) MCG/ACT inhaler Inhale 2 puffs into the lungs every 4 (four) hours as needed for wheezing or shortness of breath. 06/10/14  Yes Hannah Muthersbaugh, PA-C  aspirin 325 MG tablet Take 325 mg by mouth once.   Yes Historical Provider, MD  budesonide-formoterol (SYMBICORT) 160-4.5 MCG/ACT inhaler Inhale 2 puffs into the lungs 2 (two) times daily. 06/10/14  Yes Hannah Muthersbaugh, PA-C  levonorgestrel (MIRENA) 20 MCG/24HR IUD 1 each by Intrauterine route continuous.    Yes Historical Provider, MD  albuterol (PROVENTIL HFA;VENTOLIN HFA) 108 (90 BASE) MCG/ACT inhaler Inhale 2 puffs into the lungs every 4 (four) hours as needed for wheezing or shortness of breath. 06/14/14   Suzi Roots, MD  doxycycline (VIBRAMYCIN) 50 MG capsule Take 2 capsules (100 mg total) by mouth 2 (two) times daily. Patient not taking: Reported on 06/14/2014 02/23/14   Maretta Bees, MD  guaiFENesin-dextromethorphan (ROBITUSSIN DM) 100-10 MG/5ML syrup Take 5 mLs by mouth every 4 (four) hours as needed for cough. Patient not taking: Reported on 06/14/2014 02/23/14   Maretta Bees, MD  predniSONE (DELTASONE) 10 MG tablet Take 4 tablets (40 mg) daily for 2 days, then, Take 3 tablets (30 mg) daily for 2 days, then, Take 2 tablets (20 mg) daily for 2 days, then, Take 1 tablets (10 mg) daily for 1 days, then stop Patient not taking: Reported on 06/14/2014 06/10/14   Dahlia Client Muthersbaugh, PA-C  predniSONE (DELTASONE) 20 MG tablet Take 3 tablets (60 mg total)  by mouth daily. 06/14/14   Suzi RootsKevin E Steinl, MD    Physical Exam: Filed Vitals:   06/15/14 0130 06/15/14 0200 06/15/14 0242 06/15/14 0312  BP: 104/74 120/79 126/78 139/78  Pulse: 100 90 93 95  Temp:   98.4 F (36.9 C) 97.7 F (36.5 C)  TempSrc:   Oral Oral  Resp: 19 20 15 16   Height:    5\' 4"  (1.626 m)  Weight:    91.4 kg (201 lb 8 oz)  SpO2: 92% 94% 93% 93%     General:  Moderately built and nourished.  Eyes: Anicteric no  pallor.  ENT: No discharge from the ears eyes nose mouth.  Neck: No mass felt.  Cardiovascular: S1-S2 heard.  Respiratory: Bilateral expiratory wheeze and no crepitations.  Abdomen: Soft nontender bowel sounds present.  Skin: No rash.  Musculoskeletal: No edema.  Psychiatric: Appears normal.  Neurologic: Alert oriented to time place and person. Moves all extremities.  Labs on Admission:  Basic Metabolic Panel:  Recent Labs Lab 06/14/14 2354  NA 137  K 4.1  CL 103  CO2 20  GLUCOSE 137*  BUN 10  CREATININE 0.83  CALCIUM 9.7   Liver Function Tests: No results for input(s): AST, ALT, ALKPHOS, BILITOT, PROT, ALBUMIN in the last 168 hours. No results for input(s): LIPASE, AMYLASE in the last 168 hours. No results for input(s): AMMONIA in the last 168 hours. CBC:  Recent Labs Lab 06/14/14 2354  WBC 10.4  HGB 15.9*  HCT 46.0  MCV 89.7  PLT 251   Cardiac Enzymes: No results for input(s): CKTOTAL, CKMB, CKMBINDEX, TROPONINI in the last 168 hours.  BNP (last 3 results) No results for input(s): PROBNP in the last 8760 hours. CBG: No results for input(s): GLUCAP in the last 168 hours.  Radiological Exams on Admission: Dg Chest 2 View (if Patient Has Fever And/or Copd)  06/14/2014   CLINICAL DATA:  Severe shortness of breath, chest tightness, history of asthma  EXAM: CHEST  2 VIEW  COMPARISON:  Chest x-ray of 02/21/2014  FINDINGS: No pneumonia is seen. There is some peribronchial thickening which may indicate bronchitis. Mediastinal and hilar contours are unremarkable. The heart is within normal limits in size. No bony abnormality is noted. Surgical clips are present in the right upper quadrant from prior cholecystectomy.  IMPRESSION: No pneumonia.  Peribronchial thickening may indicate bronchitis.   Electronically Signed   By: Dwyane DeePaul  Barry M.D.   On: 06/14/2014 13:27     Assessment/Plan Principal Problem:   Asthma exacerbation   1. Asthma exacerbation - patient  has been placed on Solu-Medrol IV Pulmicort and albuterol nebulizers. 2. Pleuritic-type of chest pain - presently chest pain-free. Check troponin EKG and d-dimer. 3. Tobacco abuse - patient states that she smokes very rarely. Tobacco cessation counseling requested.   DVT Prophylaxis Lovenox.  Code Status: Full code.  Family Communication: None.  Disposition Plan: Admit to inpatient.    Delinda Malan N. [facility] Pager [phone].  If 7PM-7AM, please contact night-coverage [email or web address] Password [email or web address] 06/15/2014, 4:53 AM

## 2014-06-16 LAB — INFLUENZA PANEL BY PCR (TYPE A & B)
H1N1FLUPCR: NOT DETECTED
Influenza A By PCR: NEGATIVE
Influenza B By PCR: NEGATIVE

## 2014-06-16 MED ORDER — GUAIFENESIN ER 600 MG PO TB12: 600.0000 mg | ORAL_TABLET | Freq: Two times a day (BID) | ORAL | Status: DC

## 2014-06-16 MED ORDER — ALBUTEROL SULFATE HFA 108 (90 BASE) MCG/ACT IN AERS: 1.0000 | INHALATION_SPRAY | Freq: Four times a day (QID) | RESPIRATORY_TRACT | Status: DC | PRN

## 2014-06-16 MED ORDER — PREDNISONE 20 MG PO TABS
ORAL_TABLET | ORAL | Status: DC
Start: 2014-06-16 — End: 2014-08-09

## 2014-06-16 NOTE — Progress Notes (Signed)
Report received from Rebecca RN. Pt is alert and oriented x4, able to follow commands, denies any pain at this time. VSS, no s/s of respiratory distress on room air. Callbell within reach, bed in lowest position. Will continue to monitor closely.   

## 2014-06-16 NOTE — Progress Notes (Signed)
Report received from Rebecca RN.

## 2014-06-16 NOTE — Progress Notes (Signed)
Discharge education completed by RN. Pt  received a copy of discharge paperwork and confirms understanding of follow up appointments and discharge medications. Patient denies any questions at this time. IV removed, site is within normal limits. Pt will discharge from the unit via wheelchair. 

## 2014-06-16 NOTE — Discharge Summary (Signed)
Physician Discharge Summary  Catherine Porter ZOX:[id number] DOB: 02-23-1991 DOA: 06/14/2014  PCP: Jeanann Lewandowsky, MD  Admit date: 06/14/2014 Discharge date: 06/16/2014  Time spent: 35 minutes  Recommendations for Outpatient Follow-up:  1. Follow up with PCP for continue care for asthma  Discharge Diagnoses:    Asthma exacerbation   Discharge Condition: stable.   Diet recommendation: heart healthy  Filed Weights   06/15/14 0312  Weight: 91.4 kg (201 lb 8 oz)    History of present illness:  Catherine Porter is a 24 y.o. female with history of bronchial asthma presents with the ER because of shortness of breath and some chest pain. Patient had come to the ER earlier in the day yesterday and was given nebulizer treatment and discharge home. The patient is home patient started developing some pleuritic type of chest pain on deep breathing. Patient also has been having wheezing. In the ER chest x-ray shows bronchitis pattern. Patient has been admitted for asthma exacerbation. Patient states her chest pain is largely resolved at this time. Denies any nausea vomiting abdominal pain diarrhea fever chills. Has been having some productive cough.   Hospital Course:  1-Asthma exacerbation; continue with nebulizer, transition from  Solumedrol to prednisone.   influenza panel negative. She is feeling better. She will be discharge on prednisone and albuterol.   \ 2-chest pain; pleuritic, in setting of asthma exacerbation improved. D dimer negative. Troponin negative. Incentive spirometry ordered. Improved.   Procedures:  none  Consultations:  none  Discharge Exam: Filed Vitals:   06/16/14 0831  BP: 125/83  Pulse: 103  Temp: 97 F (36.1 C)  Resp: 18    General: S 1, S 2 RRR Cardiovascular: S 1, S 2 RRR Respiratory: CTA  Discharge Instructions   Discharge Instructions    Diet - low sodium heart healthy    Complete by:  As directed      Increase activity slowly    Complete  by:  As directed           Current Discharge Medication List    START taking these medications   Details  guaiFENesin (MUCINEX) 600 MG 12 hr tablet Take 1 tablet (600 mg total) by mouth 2 (two) times daily. Qty: 30 tablet, Refills: 0      CONTINUE these medications which have CHANGED   Details  albuterol (VENTOLIN HFA) 108 (90 BASE) MCG/ACT inhaler Inhale 1 puff into the lungs every 6 (six) hours as needed for wheezing or shortness of breath. Qty: 1 Inhaler, Refills: 1    !! predniSONE (DELTASONE) 20 MG tablet Take 3 tablets (60 mg total) by mouth daily. Qty: 12 tablet, Refills: 0    !! predniSONE (DELTASONE) 20 MG tablet Take 3 tablet for 3 days follow by 2 tablets for 3 days follow by 2 tablet for 2 days follow by half table. Qty: 20 tablet, Refills: 0     !! - Potential duplicate medications found. Please discuss with provider.    CONTINUE these medications which have NOT CHANGED   Details  budesonide-formoterol (SYMBICORT) 160-4.5 MCG/ACT inhaler Inhale 2 puffs into the lungs 2 (two) times daily. Qty: 1 Inhaler, Refills: 2    levonorgestrel (MIRENA) 20 MCG/24HR IUD 1 each by Intrauterine route continuous.       STOP taking these medications     aspirin 325 MG tablet      doxycycline (VIBRAMYCIN) 50 MG capsule      guaiFENesin-dextromethorphan (ROBITUSSIN DM) 100-10 MG/5ML syrup  Allergies  Allergen Reactions  . Fish Allergy Swelling    All Seafood also   Follow-up Information    Follow up with Jeanann LewandowskyJEGEDE, OLUGBEMIGA, MD.   Specialty:  Internal Medicine   Contact information:   [address] [place] [address] [phone]        The results of significant diagnostics from this hospitalization (including imaging, microbiology, ancillary and laboratory) are listed below for reference.    Significant Diagnostic Studies: Dg Chest 2 View (if Patient Has Fever And/or Copd)  06/14/2014   CLINICAL DATA:  Severe shortness of breath, chest tightness,  history of asthma  EXAM: CHEST  2 VIEW  COMPARISON:  Chest x-ray of 02/21/2014  FINDINGS: No pneumonia is seen. There is some peribronchial thickening which may indicate bronchitis. Mediastinal and hilar contours are unremarkable. The heart is within normal limits in size. No bony abnormality is noted. Surgical clips are present in the right upper quadrant from prior cholecystectomy.  IMPRESSION: No pneumonia.  Peribronchial thickening may indicate bronchitis.   Electronically Signed   By: Dwyane DeePaul  Barry M.D.   On: 06/14/2014 13:27    Microbiology: No results found for this or any previous visit (from the past 240 hour(s)).   Labs: Basic Metabolic Panel:  Recent Labs Lab 06/14/14 2354 06/15/14 0549  NA 137 138  K 4.1 3.5  CL 103 104  CO2 20 24  GLUCOSE 137* 112*  BUN 10 10  CREATININE 0.83 0.59  CALCIUM 9.7 9.1   Liver Function Tests: No results for input(s): AST, ALT, ALKPHOS, BILITOT, PROT, ALBUMIN in the last 168 hours. No results for input(s): LIPASE, AMYLASE in the last 168 hours. No results for input(s): AMMONIA in the last 168 hours. CBC:  Recent Labs Lab 06/14/14 2354 06/15/14 0549  WBC 10.4 11.4*  HGB 15.9* 14.1  HCT 46.0 41.8  MCV 89.7 88.2  PLT 251 237   Cardiac Enzymes:  Recent Labs Lab 06/15/14 0549  TROPONINI <0.03   BNP: BNP (last 3 results) No results for input(s): PROBNP in the last 8760 hours. CBG: No results for input(s): GLUCAP in the last 168 hours.     SignedHartley Barefoot:  Teagen Bucio A  [facility] 06/16/2014, 11:05 AM

## 2014-06-22 ENCOUNTER — Inpatient Hospital Stay: Payer: Self-pay | Admitting: Internal Medicine

## 2014-08-09 ENCOUNTER — Inpatient Hospital Stay (HOSPITAL_COMMUNITY)
Admission: EM | Admit: 2014-08-09 | Discharge: 2014-08-10 | DRG: 203 | Discharge status: Against Medical Advice | Payer: Medicaid Other | Attending: Internal Medicine | Admitting: Internal Medicine

## 2014-08-09 ENCOUNTER — Encounter (HOSPITAL_COMMUNITY): Payer: Self-pay | Admitting: Emergency Medicine

## 2014-08-09 ENCOUNTER — Emergency Department (HOSPITAL_COMMUNITY): Payer: Medicaid Other

## 2014-08-09 DIAGNOSIS — I10 Essential (primary) hypertension: Secondary | ICD-10-CM | POA: Diagnosis present

## 2014-08-09 DIAGNOSIS — J4551 Severe persistent asthma with (acute) exacerbation: Principal | ICD-10-CM | POA: Diagnosis present

## 2014-08-09 DIAGNOSIS — J45901 Unspecified asthma with (acute) exacerbation: Secondary | ICD-10-CM | POA: Diagnosis present

## 2014-08-09 LAB — BASIC METABOLIC PANEL
Anion gap: 11 (ref 5–15)
BUN: 5 mg/dL — AB (ref 6–23)
CHLORIDE: 101 mmol/L (ref 96–112)
CO2: 27 mmol/L (ref 19–32)
Calcium: 9.9 mg/dL (ref 8.4–10.5)
Creatinine, Ser: 0.74 mg/dL (ref 0.50–1.10)
GFR calc Af Amer: >90 mL/min (ref >90)
GLUCOSE: 107 mg/dL — AB (ref 70–99)
POTASSIUM: 4 mmol/L (ref 3.5–5.1)
Sodium: 139 mmol/L (ref 135–145)

## 2014-08-09 LAB — CBC
HEMATOCRIT: 46 % (ref 36.0–46.0)
HEMOGLOBIN: 15.7 g/dL — AB (ref 12.0–15.0)
MCH: 30.7 pg (ref 26.0–34.0)
MCHC: 34.1 g/dL (ref 30.0–36.0)
MCV: 89.8 fL (ref 78.0–100.0)
Platelets: 278 10*3/uL (ref 150–400)
RBC: 5.12 MIL/uL — ABNORMAL HIGH (ref 3.87–5.11)
RDW: 13.3 % (ref 11.5–15.5)
WBC: 14.1 10*3/uL — ABNORMAL HIGH (ref 4.0–10.5)

## 2014-08-09 LAB — TSH: TSH: 0.433 u[IU]/mL (ref 0.350–4.500)

## 2014-08-09 MED ORDER — IPRATROPIUM BROMIDE 0.02 % IN SOLN
0.5000 mg | Freq: Four times a day (QID) | RESPIRATORY_TRACT | Status: DC
  Administered 2014-08-09 – 2014-08-10 (×2): 0.5 mg via RESPIRATORY_TRACT
  Filled 2014-08-09 (×2): qty 2.5

## 2014-08-09 MED ORDER — IPRATROPIUM BROMIDE 0.02 % IN SOLN
0.5000 mg | Freq: Once | RESPIRATORY_TRACT | Status: AC
  Administered 2014-08-09: 0.5 mg via RESPIRATORY_TRACT
  Filled 2014-08-09: qty 2.5

## 2014-08-09 MED ORDER — CYCLOBENZAPRINE HCL 10 MG PO TABS
10.0000 mg | ORAL_TABLET | Freq: Once | ORAL | Status: AC
  Administered 2014-08-09: 10 mg via ORAL
  Filled 2014-08-09: qty 1

## 2014-08-09 MED ORDER — METHYLPREDNISOLONE SODIUM SUCC 125 MG IJ SOLR
125.0000 mg | Freq: Once | INTRAMUSCULAR | Status: AC
  Administered 2014-08-09: 125 mg via INTRAVENOUS
  Filled 2014-08-09: qty 2

## 2014-08-09 MED ORDER — ONDANSETRON HCL 4 MG/2ML IJ SOLN: 4.0000 mg | Freq: Four times a day (QID) | INTRAMUSCULAR | Status: DC | PRN

## 2014-08-09 MED ORDER — ALBUTEROL SULFATE (2.5 MG/3ML) 0.083% IN NEBU
2.5000 mg | INHALATION_SOLUTION | Freq: Four times a day (QID) | RESPIRATORY_TRACT | Status: DC
  Administered 2014-08-09 – 2014-08-10 (×2): 2.5 mg via RESPIRATORY_TRACT
  Filled 2014-08-09 (×2): qty 3

## 2014-08-09 MED ORDER — KETOROLAC TROMETHAMINE 30 MG/ML IJ SOLN
30.0000 mg | Freq: Once | INTRAMUSCULAR | Status: AC
  Administered 2014-08-09: 30 mg via INTRAVENOUS
  Filled 2014-08-09: qty 1

## 2014-08-09 MED ORDER — ACETAMINOPHEN 325 MG PO TABS
650.0000 mg | ORAL_TABLET | Freq: Four times a day (QID) | ORAL | Status: DC | PRN
  Administered 2014-08-09 – 2014-08-10 (×2): 650 mg via ORAL
  Filled 2014-08-09 (×2): qty 2

## 2014-08-09 MED ORDER — MAGNESIUM SULFATE 2 GM/50ML IV SOLN
2.0000 g | Freq: Once | INTRAVENOUS | Status: AC
  Administered 2014-08-09: 2 g via INTRAVENOUS
  Filled 2014-08-09: qty 50

## 2014-08-09 MED ORDER — LEVONORGESTREL 20 MCG/24HR IU IUD: 1.0000 | INTRAUTERINE_SYSTEM | INTRAUTERINE | Status: DC

## 2014-08-09 MED ORDER — MORPHINE SULFATE 4 MG/ML IJ SOLN: 4.0000 mg | Freq: Once | INTRAMUSCULAR | Status: DC

## 2014-08-09 MED ORDER — IPRATROPIUM-ALBUTEROL 0.5-2.5 (3) MG/3ML IN SOLN
3.0000 mL | Freq: Once | RESPIRATORY_TRACT | Status: AC
  Administered 2014-08-09: 3 mL via RESPIRATORY_TRACT

## 2014-08-09 MED ORDER — ACETAMINOPHEN 650 MG RE SUPP: 650.0000 mg | Freq: Four times a day (QID) | RECTAL | Status: DC | PRN

## 2014-08-09 MED ORDER — DEXTROSE 5 % IV SOLN
1.0000 g | INTRAVENOUS | Status: DC
  Administered 2014-08-09 – 2014-08-10 (×2): 1 g via INTRAVENOUS
  Filled 2014-08-09 (×2): qty 10

## 2014-08-09 MED ORDER — BUDESONIDE-FORMOTEROL FUMARATE 160-4.5 MCG/ACT IN AERO
2.0000 | INHALATION_SPRAY | Freq: Two times a day (BID) | RESPIRATORY_TRACT | Status: DC
  Administered 2014-08-09 – 2014-08-10 (×3): 2 via RESPIRATORY_TRACT
  Filled 2014-08-09: qty 6

## 2014-08-09 MED ORDER — ONDANSETRON HCL 4 MG PO TABS: 4.0000 mg | ORAL_TABLET | Freq: Four times a day (QID) | ORAL | Status: DC | PRN

## 2014-08-09 MED ORDER — IPRATROPIUM-ALBUTEROL 0.5-2.5 (3) MG/3ML IN SOLN
3.0000 mL | Freq: Once | RESPIRATORY_TRACT | Status: AC
  Administered 2014-08-09: 3 mL via RESPIRATORY_TRACT
  Filled 2014-08-09: qty 3

## 2014-08-09 MED ORDER — ALBUTEROL (5 MG/ML) CONTINUOUS INHALATION SOLN
10.0000 mg/h | INHALATION_SOLUTION | Freq: Once | RESPIRATORY_TRACT | Status: AC
  Administered 2014-08-09: 10 mg/h via RESPIRATORY_TRACT
  Filled 2014-08-09: qty 20

## 2014-08-09 MED ORDER — SODIUM CHLORIDE 0.9 % IV SOLN
INTRAVENOUS | Status: DC
  Administered 2014-08-09 – 2014-08-10 (×2): via INTRAVENOUS

## 2014-08-09 MED ORDER — HEPARIN SODIUM (PORCINE) 5000 UNIT/ML IJ SOLN
5000.0000 [IU] | Freq: Three times a day (TID) | INTRAMUSCULAR | Status: DC
  Administered 2014-08-09 – 2014-08-10 (×4): 5000 [IU] via SUBCUTANEOUS
  Filled 2014-08-09 (×4): qty 1

## 2014-08-09 MED ORDER — METHYLPREDNISOLONE SODIUM SUCC 125 MG IJ SOLR
60.0000 mg | Freq: Four times a day (QID) | INTRAMUSCULAR | Status: DC
  Administered 2014-08-09 – 2014-08-10 (×2): 60 mg via INTRAVENOUS
  Filled 2014-08-09: qty 0.96
  Filled 2014-08-09: qty 2
  Filled 2014-08-09: qty 0.96
  Filled 2014-08-09: qty 2
  Filled 2014-08-09 (×2): qty 0.96

## 2014-08-09 MED ORDER — IPRATROPIUM-ALBUTEROL 0.5-2.5 (3) MG/3ML IN SOLN
RESPIRATORY_TRACT | Status: AC
  Filled 2014-08-09: qty 3

## 2014-08-09 MED ORDER — SODIUM CHLORIDE 0.9 % IJ SOLN
3.0000 mL | Freq: Two times a day (BID) | INTRAMUSCULAR | Status: DC
  Administered 2014-08-09: 3 mL via INTRAVENOUS

## 2014-08-09 MED ORDER — HYDROCODONE-ACETAMINOPHEN 5-325 MG PO TABS
1.0000 | ORAL_TABLET | Freq: Once | ORAL | Status: AC
  Administered 2014-08-09: 1 via ORAL
  Filled 2014-08-09: qty 1

## 2014-08-09 NOTE — ED Notes (Signed)
Dinner tray ordered for patient.

## 2014-08-09 NOTE — ED Provider Notes (Signed)
CSN: [id number]     Arrival date & time 08/09/14  1424 History   First MD Initiated Contact with Patient 08/09/14 1525     Chief Complaint  Patient presents with  . Asthma  . Cough     (Consider location/radiation/quality/duration/timing/severity/associated sxs/prior Treatment) The history is provided by the patient and medical records.     Patient presents with hx asthma diagnosed 3 years ago presents with SOB, wheezing, increased need for her albuterol inhaler.  States she has headache, back pain, and chest pain while coughing only.  Denies fevers, abdominal pain, leg swelling.  She is on symbicort and albuterol.  Was last on steroids 2-3 weeks ago.  States she uses her albuterol inhaler more than every 4 hours. States she never had asthma as a child but did have episodes of apnea as a baby.    Past Medical History  Diagnosis Date  . Asthma   . Migraine     "not often anymore" (02/22/2014)  . Arthritis     "fingers" (02/22/2014)   Past Surgical History  Procedure Laterality Date  . Cholecystectomy  2014  . Tonsillectomy  ~ 2008  . Tonsillectomy/adenoidectomy/turbinate reduction  ~ 2008   Family History  Problem Relation Age of Onset  . Anesthesia problems Neg Hx   . Asthma Neg Hx    History  Substance Use Topics  . Smoking status: Former Smoker -- 4 years    Types: Cigarettes    Quit date: 10/06/2013  . Smokeless tobacco: Never Used  . Alcohol Use: No     Comment: 02/22/2014 "maybe once/month"   OB History    Gravida Para Term Preterm AB TAB SAB Ectopic Multiple Living   0 0 0 0 0 0 2      Obstetric Comments   2 children, one boy and girl     Review of Systems  All other systems reviewed and are negative.     Allergies  Fish allergy  Home Medications   Prior to Admission medications   Medication Sig Start Date End Date Taking? Authorizing Provider  albuterol (VENTOLIN HFA) 108 (90 BASE) MCG/ACT inhaler Inhale 1 puff into the lungs every 6 (six)  hours as needed for wheezing or shortness of breath. 06/16/14   Belkys A Regalado, MD  budesonide-formoterol (SYMBICORT) 160-4.5 MCG/ACT inhaler Inhale 2 puffs into the lungs 2 (two) times daily. 06/10/14   Hannah Muthersbaugh, PA-C  guaiFENesin (MUCINEX) 600 MG 12 hr tablet Take 1 tablet (600 mg total) by mouth 2 (two) times daily. 06/16/14   Belkys A Regalado, MD  levonorgestrel (MIRENA) 20 MCG/24HR IUD 1 each by Intrauterine route continuous.     Historical Provider, MD  predniSONE (DELTASONE) 20 MG tablet Take 3 tablets (60 mg total) by mouth daily. 06/14/14   Suzi Roots, MD  predniSONE (DELTASONE) 20 MG tablet Take 3 tablet for 3 days follow by 2 tablets for 3 days follow by 2 tablet for 2 days follow by half table. 06/16/14   Belkys A Regalado, MD   BP 120/86 mmHg  Pulse 113  Temp(Src) 98.3 F (36.8 C) (Oral)  Resp 20  Ht  (1.6 m)  Wt 200 lb (90.719 kg)  BMI 35.44 kg/m2  SpO2 96%  LMP  (LMP Unknown) Physical Exam  Constitutional: She appears well-developed and well-nourished. No distress.  HENT:  Head: Normocephalic and atraumatic.  Neck: Neck supple.  Cardiovascular: Normal rate and regular rhythm.   Pulmonary/Chest: Effort normal. No  respiratory distress. She has wheezes. She has no rales.  Abdominal: Soft. She exhibits no distension. There is no tenderness. There is no rebound and no guarding.  Musculoskeletal: She exhibits no edema.  Neurological: She is alert.  Skin: She is not diaphoretic.  Psychiatric: She has a normal mood and affect. Her behavior is normal.  Nursing note and vitals reviewed.   ED Course  Procedures (including critical care time) Labs Review Labs Reviewed  CBC - Abnormal; Notable for the following:    WBC 14.1 (*)    RBC 5.12 (*)    Hemoglobin 15.7 (*)    All other components within normal limits  BASIC METABOLIC PANEL - Abnormal; Notable for the following:    Glucose, Bld 107 (*)    BUN 5 (*)    All other components within normal limits  TSH     Imaging Review Dg Chest 2 View  08/09/2014   CLINICAL DATA:  One week history of cough and difficulty breathing  EXAM: CHEST  2 VIEW  COMPARISON:  June 14, 2014  FINDINGS: Lungs are clear. Heart size and pulmonary vascularity are normal. No adenopathy. No pneumothorax. No bone lesions.  IMPRESSION: No edema or consolidation.   Electronically Signed   By: Bretta BangWilliam  Woodruff III M.D.   On: 08/09/2014 16:02     EKG Interpretation None       I spoke with Michel BickersWandalyn Rogers, case manager, who will also see patient to assist with resources and possible referral to pulmonology.   MDM   Final diagnoses:  Asthma exacerbation    Afebrile, nontoxic patient with hx asthma p/w SOB, wheezing.  Duoneb followed by hour long neb with magnesium and solu medrol given without improvement.  Second duoneb ordered again without improvement.  Pt continued to have wheezing on exam and feeling SOB.  O2 normal.  Pt admitted to [facility] for further treatment.  Admission accepted by Dr Welton FlakesKhan.        Trixie Dredgemily Shirline Kendle, PA-C 08/09/14 1920  Elwin MochaBlair Walden, MD 08/10/14 [phone]

## 2014-08-09 NOTE — H&P (Signed)
[facility] History and Physical  Catherine Porter HYQ:[id number] DOB: 07/19/1990 DOA: 08/09/2014  Referring physician: Trixie Dredge, PA PCP: Catherine Cheadle, MD   Chief Complaint: Asthma  HPI: Catherine Porter is a 24 y.o. female presents with asthma exacerbation. Patient states that she has had difficulty with her breathing for the last 2 years. She states that she had initially been on albuterol and also advair. She was then switched to symbicort but she states that she has not been able to buy the medication due to cost. She has been on and off steroids. She states primarily she has been using albuterol basically non-stop. She has severe wheeze. She has not been able to sleep well at night. She states that she has cough noted. She states the sputum is clear likel saliva. She has had pain and tightness in her chest and also now has a headache. She does not smoke but has a smoker around her but not in the house. 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 She has no prior asthma history as a child.   Review of Systems:  Constitutional:  No weight loss, night sweats, Fevers, chills, ++fatigue.  HEENT:  ++headaches, +sneezing, itching, ear ache, nasal congestion, post nasal drip,  Cardio-vascular:  +chest pain, no Orthopnea, PND, swelling in lower extremities GI:  No abdominal pain, nausea, vomiting, diarrhea  Resp:  ++shortness of breath ++cough. ++wheezing  Skin:  no rash or lesions GU:  no dysuria, change in color of urine, no urgency or frequency  Musculoskeletal:  No joint pain or swelling. No decreased range of motion. ++back pain.  Psych:  No change in mood or affect. ++anxiety. No memory loss.   Past Medical History  Diagnosis Date  . Asthma   . Migraine     "not often anymore" (02/22/2014)  . Arthritis     "fingers" (02/22/2014)   Past Surgical History  Procedure Laterality Date  . Cholecystectomy  2014  . Tonsillectomy  ~ 2008  .  Tonsillectomy/adenoidectomy/turbinate reduction  ~ 2008   Social History:  reports that she quit smoking about 10 months ago. Her smoking use included Cigarettes. She quit after 4 years of use. She has never used smokeless tobacco. She reports that she does not drink alcohol or use illicit drugs.  Allergies  Allergen Reactions  . Fish Allergy Swelling    All Seafood also    Family History  Problem Relation Age of Onset  . Anesthesia problems Neg Hx   . Asthma Neg Hx      Prior to Admission medications   Medication Sig Start Date End Date Taking? Authorizing Provider  albuterol (VENTOLIN HFA) 108 (90 BASE) MCG/ACT inhaler Inhale 1 puff into the lungs every 6 (six) hours as needed for wheezing or shortness of breath. 06/16/14  Yes Catherine A Regalado, MD  budesonide-formoterol (SYMBICORT) 160-4.5 MCG/ACT inhaler Inhale 2 puffs into the lungs 2 (two) times daily. 06/10/14   Catherine Muthersbaugh, PA-C  levonorgestrel (MIRENA) 20 MCG/24HR IUD 1 each by Intrauterine route continuous.     Historical Provider, MD   Physical Exam: Filed Vitals:   08/09/14 1630 08/09/14 1700 08/09/14 1730 08/09/14 1800  BP: 128/77 119/74 122/72 127/81  Pulse: 98 101  112  Temp:      TempSrc:      Resp:      Height:      Weight:      SpO2: 95% 96% 96% 95%    Wt Readings from Last  3 Encounters:  08/09/14 90.719 kg (200 lb)  06/15/14 91.4 kg (201 lb 8 oz)  06/14/14 88.451 kg (195 lb)    General:  Appears calm and comfortable Eyes: PERRL, normal lids, irises & conjunctiva ENT: grossly normal hearing, lips & tongue Neck: no LAD, masses or thyromegaly Cardiovascular: RRR, no m/r/g. No LE edema. Respiratory: good air movement ++ronchi diffusely in both lung fields. Abdomen: soft, ntnd Skin: no rash or induration seen on limited exam Musculoskeletal: grossly normal tone BUE/BLE Psychiatric: grossly normal mood and affect, speech fluent and appropriate Neurologic: grossly non-focal.          Labs on  Admission:  Basic Metabolic Panel:  Recent Labs Lab 08/09/14 1440  NA 139  K 4.0  CL 101  CO2 27  GLUCOSE 107*  BUN 5*  CREATININE 0.74  CALCIUM 9.9   Liver Function Tests: No results for input(s): AST, ALT, ALKPHOS, BILITOT, PROT, ALBUMIN in the last 168 hours. No results for input(s): LIPASE, AMYLASE in the last 168 hours. No results for input(s): AMMONIA in the last 168 hours. CBC:  Recent Labs Lab 08/09/14 1440  WBC 14.1*  HGB 15.7*  HCT 46.0  MCV 89.8  PLT 278   Cardiac Enzymes: No results for input(s): CKTOTAL, CKMB, CKMBINDEX, TROPONINI in the last 168 hours.  BNP (last 3 results) No results for input(s): BNP in the last 8760 hours.  ProBNP (last 3 results) No results for input(s): PROBNP in the last 8760 hours.  CBG: No results for input(s): GLUCAP in the last 168 hours.  Radiological Exams on Admission: Dg Chest 2 View  08/09/2014   CLINICAL DATA:  One week history of cough and difficulty breathing  EXAM: CHEST  2 VIEW  COMPARISON:  June 14, 2014  FINDINGS: Lungs are clear. Heart size and pulmonary vascularity are normal. No adenopathy. No pneumothorax. No bone lesions.  IMPRESSION: No edema or consolidation.   Electronically Signed   By: Bretta BangWilliam  Porter III M.D.   On: 08/09/2014 16:02      Assessment/Plan Principal Problem:   Severe persistent asthma with acute exacerbation in adult Active Problems:   Essential hypertension, benign   Asthma exacerbation attacks   1. Severe persistent asthma with acute exacerbation -will admit for observation -started on IV solumedrol 60 mg q6h -will also start on rocephin now -aggressive bronchodilator therapy -will continue with symbicort she has not been taking it -will need outpatient follow up with pulmonology  2. Hypertension -monitor pressures -not on therapy and is controlled    Code Status: Full Code (must indicate code status--if unknown or must be presumed, indicate so) DVT  Prophylaxis:Heparin Family Communication: None (indicate person spoken with, if applicable, with phone number if by telephone) Disposition Plan: Home (indicate anticipated LOS)  Time spent: 60min  [facility] VallejoKHAN,Eiliyah Reh A [facility] Pager [phone]

## 2014-08-09 NOTE — ED Notes (Signed)
[facility] to [place] with resp therapy. He is aware of pt continuous nebs

## 2014-08-09 NOTE — ED Notes (Signed)
PA IN TO [facility]

## 2014-08-09 NOTE — Progress Notes (Signed)
New Admission Note:   Arrival Method: via stretcher from ED Mental Orientation: Alert and Oriented x4 Telemetry: Placed on box [id number]; Sinus Tach Assessment: Completed Skin: Intact, warm, and dry IV: Right Forearm Normal Saline at 50cc/hr Pain: C/o Pressure with back pain at 6/10 Tubes: N/A Safety Measures: Educated on fall prevention safety plan, patient acknowledged and understood. Admission: Completed [address] [place] Orientation: Patient has been orientated to the room, unit and staff.  Family: N/A  Orders have been reviewed and implemented. Will continue to monitor the patient. Call light has been placed within reach and bed alarm has been activated.    Billy FischerLynn Leocadia Idleman, RN  Phone number: [phone]

## 2014-08-09 NOTE — ED Notes (Signed)
Attempted to call report. Spoke to [employer]. Unable to take report at this time

## 2014-08-09 NOTE — ED Notes (Signed)
Patient reports increased wheezing over the past week. States bought a new inhaler last Sunday and states it is half gone. Denies being exposed to smoke. Pt with mild insp. And coarse exp wheeze

## 2014-08-09 NOTE — ED Notes (Signed)
Bed assigned

## 2014-08-09 NOTE — ED Notes (Signed)
Patient comes in complaining of her asthma flaring up more often.  Complains it is causing a cough with mild chest pain and a headache.  Patient denies shortness of breath.  Last use of inhaler (albuterol) was right before walking into ED.  Patients states she is having to use the inhaler around 10 times a day for a week.

## 2014-08-09 NOTE — Care Management (Addendum)
ED CM received consult from E. [place] PA-C concerning f/u with Pulmonary clinic. Patient has had 6 ED visits and 2 admissions for related complaints.Presented to [place] today with c/o wheezing. Reviiewed record, Medicaid/Dr. Annitta Needs PCP.  Met with patient at bedside, she reports that she has gone to her PCP who prescribes her asthma medications but she continue to have the similar complaints of wheezing. Offered to schedule appt with pulmonary care clinic, patient agreeable. Appt scheduled for 3/14 at 12:00 at [facility]. Information placed on AVS. Updated E. [place] PA-C,she is agreeable with disposition plan. No further ED CM needs identified.

## 2014-08-10 LAB — CBC
HCT: 42.2 % (ref 36.0–46.0)
Hemoglobin: 14.3 g/dL (ref 12.0–15.0)
MCH: 30.8 pg (ref 26.0–34.0)
MCHC: 33.9 g/dL (ref 30.0–36.0)
MCV: 90.8 fL (ref 78.0–100.0)
PLATELETS: 290 10*3/uL (ref 150–400)
RBC: 4.65 MIL/uL (ref 3.87–5.11)
RDW: 13.6 % (ref 11.5–15.5)
WBC: 13.6 10*3/uL — AB (ref 4.0–10.5)

## 2014-08-10 LAB — COMPREHENSIVE METABOLIC PANEL
ALT: 21 U/L (ref 0–35)
AST: 16 U/L (ref 0–37)
Albumin: 3.7 g/dL (ref 3.5–5.2)
Alkaline Phosphatase: 74 U/L (ref 39–117)
Anion gap: 10 (ref 5–15)
BUN: 8 mg/dL (ref 6–23)
CALCIUM: 9.3 mg/dL (ref 8.4–10.5)
CHLORIDE: 105 mmol/L (ref 96–112)
CO2: 24 mmol/L (ref 19–32)
CREATININE: 0.62 mg/dL (ref 0.50–1.10)
GLUCOSE: 156 mg/dL — AB (ref 70–99)
Potassium: 4.8 mmol/L (ref 3.5–5.1)
Sodium: 139 mmol/L (ref 135–145)
Total Bilirubin: 0.4 mg/dL (ref 0.3–1.2)
Total Protein: 6.9 g/dL (ref 6.0–8.3)

## 2014-08-10 LAB — GLUCOSE, CAPILLARY: Glucose-Capillary: 166 mg/dL — ABNORMAL HIGH (ref 70–99)

## 2014-08-10 MED ORDER — ALBUTEROL SULFATE (2.5 MG/3ML) 0.083% IN NEBU: 2.5000 mg | INHALATION_SOLUTION | RESPIRATORY_TRACT | Status: DC | PRN

## 2014-08-10 MED ORDER — IPRATROPIUM-ALBUTEROL 0.5-2.5 (3) MG/3ML IN SOLN
3.0000 mL | Freq: Four times a day (QID) | RESPIRATORY_TRACT | Status: DC
  Administered 2014-08-10 (×3): 3 mL via RESPIRATORY_TRACT
  Filled 2014-08-10 (×3): qty 3

## 2014-08-10 MED ORDER — DEXTROSE 5 % IV SOLN
500.0000 mg | INTRAVENOUS | Status: DC
  Administered 2014-08-10: 500 mg via INTRAVENOUS
  Filled 2014-08-10: qty 500

## 2014-08-10 MED ORDER — METHYLPREDNISOLONE SODIUM SUCC 125 MG IJ SOLR
60.0000 mg | Freq: Two times a day (BID) | INTRAMUSCULAR | Status: DC
  Administered 2014-08-10: 60 mg via INTRAVENOUS

## 2014-08-10 NOTE — Progress Notes (Signed)
PROGRESS NOTE  Catherine CurtDaynez O Signer ZOX:[id number] DOB: 10/31/1990 DOA: 08/09/2014 PCP: Doris CheadleADVANI, DEEPAK, MD  Assessment/Plan: 1. Severe persistent asthma with acute exacerbation -wean IV solumedrol -rocephin/azithromycin -aggressive bronchodilator therapy -will continue with symbicort she has not been taking it -will need outpatient follow up with pulmonology    Code Status: full Family Communication: patient Disposition Plan: d/c in AM if wheezing resolved   Consultants:    Procedures:      HPI/Subjective: Still with some wheezing  Objective: Filed Vitals:   08/10/14 0500  BP: 121/62  Pulse: 89  Temp: 97.8 F (36.6 C)  Resp: 16    Intake/Output Summary (Last 24 hours) at 08/10/14 0946 Last data filed at 08/10/14 0200  Gross per 24 hour  Intake 275.83 ml  Output      0 ml  Net 275.83 ml   Filed Weights   08/09/14 1433 08/09/14 2050  Weight: 90.719 kg (200 lb) 95.074 kg (209 lb 9.6 oz)    Exam:   General:  A+Ox3, NAD  Cardiovascular: rrr  Respiratory: wheezing in all fields  Abdomen: +BS, soft  Musculoskeletal: no edema   Data Reviewed: Basic Metabolic Panel:  Recent Labs Lab 08/09/14 1440 08/10/14 0625  NA 139 139  K 4.0 4.8  CL 101 105  CO2 27 24  GLUCOSE 107* 156*  BUN 5* 8  CREATININE 0.74 0.62  CALCIUM 9.9 9.3   Liver Function Tests:  Recent Labs Lab 08/10/14 0625  AST 16  ALT 21  ALKPHOS 74  BILITOT 0.4  PROT 6.9  ALBUMIN 3.7   No results for input(s): LIPASE, AMYLASE in the last 168 hours. No results for input(s): AMMONIA in the last 168 hours. CBC:  Recent Labs Lab 08/09/14 1440 08/10/14 0625  WBC 14.1* 13.6*  HGB 15.7* 14.3  HCT 46.0 42.2  MCV 89.8 90.8  PLT 278 290   Cardiac Enzymes: No results for input(s): CKTOTAL, CKMB, CKMBINDEX, TROPONINI in the last 168 hours. BNP (last 3 results) No results for input(s): BNP in the last 8760 hours.  ProBNP (last 3 results) No results for input(s): PROBNP in  the last 8760 hours.  CBG:  Recent Labs Lab 08/10/14 0847  GLUCAP 166*    No results found for this or any previous visit (from the past 240 hour(s)).   Studies: Dg Chest 2 View  08/09/2014   CLINICAL DATA:  One week history of cough and difficulty breathing  EXAM: CHEST  2 VIEW  COMPARISON:  June 14, 2014  FINDINGS: Lungs are clear. Heart size and pulmonary vascularity are normal. No adenopathy. No pneumothorax. No bone lesions.  IMPRESSION: No edema or consolidation.   Electronically Signed   By: Bretta BangWilliam  Woodruff III M.D.   On: 08/09/2014 16:02    Scheduled Meds: . azithromycin  500 mg Intravenous Q24H  . budesonide-formoterol  2 puff Inhalation BID  . cefTRIAXone (ROCEPHIN)  IV  1 g Intravenous Q24H  . heparin  5,000 Units Subcutaneous 3 times per day  . ipratropium-albuterol  3 mL Nebulization Q6H  . methylPREDNISolone (SOLU-MEDROL) injection  60 mg Intravenous Q12H  . sodium chloride  3 mL Intravenous Q12H   Continuous Infusions: . sodium chloride 50 mL/hr at 08/09/14 2117   Antibiotics Given (last 72 hours)    None      Principal Problem:   Severe persistent asthma with acute exacerbation in adult Active Problems:   Essential hypertension, benign   Asthma exacerbation attacks    Time spent: 25 min  Marlin Canary  [facility] Pager [phone]. If 7PM-7AM, please contact night-coverage at [email or web address], password [facility] 08/10/2014, 9:46 AM

## 2014-08-10 NOTE — Care Management Note (Signed)
CARE MANAGEMENT NOTE 08/10/2014  Patient:  Catherine Porter,Catherine Porter   Account Number:  [id number]  Date Initiated:  08/10/2014  Documentation initiated by:  Analynn Daum  Subjective/Objective Assessment:   CM following for progression and d/c planning.     Action/Plan:   ED CM has [facility] for [facility]/22/2014 @ 12noon.   Anticipated DC Date:  08/12/2014   Anticipated DC Plan:  HOME/SELF CARE         Choice offered to / List presented to:             Status of service:  In process, will continue to follow Medicare Important Message given?   (If response is "NO", the following Medicare IM given date fields will be blank) Date Medicare IM given:   Medicare IM given by:   Date Additional Medicare IM given:   Additional Medicare IM given by:    Discharge Disposition:    Per UR Regulation:    If discussed at Long Length of Stay Meetings, dates discussed:    Comments:

## 2014-08-10 NOTE — Progress Notes (Signed)
Comments:  Notified by RN that pt left AMA. According to RN the pt cited childcare problems after a verbal disagreement with her mother while she was visiting. Pt's VS just prior to d/c were BP-142/82, T-98.8, P-115, R-18 w/ 02 sats of 92% on R/A.  Leanne ChangKatherine P. Delitha Elms, NP-C [facility] Pager [phone]

## 2014-08-10 NOTE — Progress Notes (Signed)
Spoke to patient wanting to go home AMA due to child care issues. Will update MD.

## 2014-08-10 NOTE — Progress Notes (Signed)
Patient adamant of leaving the hospital.  "There's nobody to take care of my children." Patient signed AMA form.  Patient's mother visited earlier and had an argument with patient. Mother refused to take care of her children as stated by patient.

## 2014-08-11 LAB — HEMOGLOBIN A1C
HEMOGLOBIN A1C: 5.7 % — AB (ref 4.8–5.6)
MEAN PLASMA GLUCOSE: 117 mg/dL

## 2014-08-22 ENCOUNTER — Institutional Professional Consult (permissible substitution): Payer: Medicaid Other | Admitting: Internal Medicine

## 2014-08-30 ENCOUNTER — Institutional Professional Consult (permissible substitution): Payer: Medicaid Other | Admitting: Internal Medicine

## 2014-10-31 IMAGING — US US PELVIS COMPLETE
1 series · 14 of 25 positions shown · non-contrast
Comparison: None

CLINICAL DATA: Right lower quadrant pain. IUD. Evaluate for
migration.

EXAM:
TRANSABDOMINAL AND TRANSVAGINAL ULTRASOUND OF PELVIS
TECHNIQUE: Both transabdominal and transvaginal ultrasound examinations of the
pelvis were performed. Transabdominal technique was performed for
global imaging of the pelvis including uterus, ovaries, adnexal
regions, and pelvic cul-de-sac. It was necessary to proceed with
endovaginal exam following the transabdominal exam to visualize the
endometrium, ovaries and adnexa.

[Series 1: us pelvis complete · 0.21mm/px · 14 of 58 slices shown]
[im 1/58]
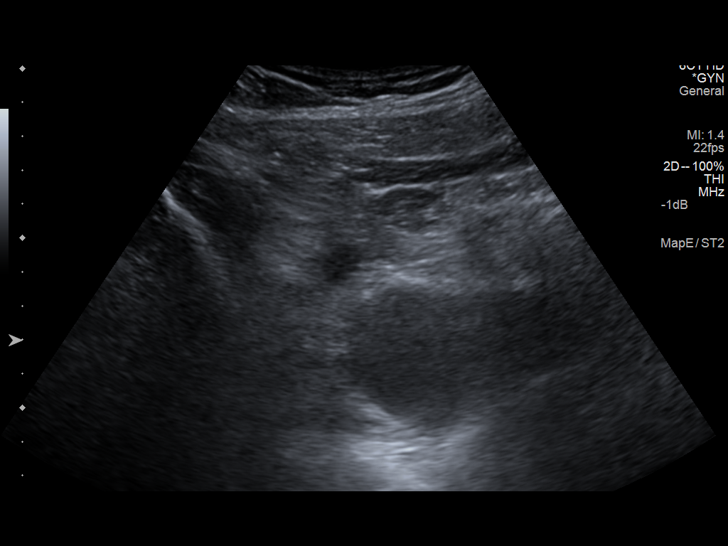
[im 5/58]
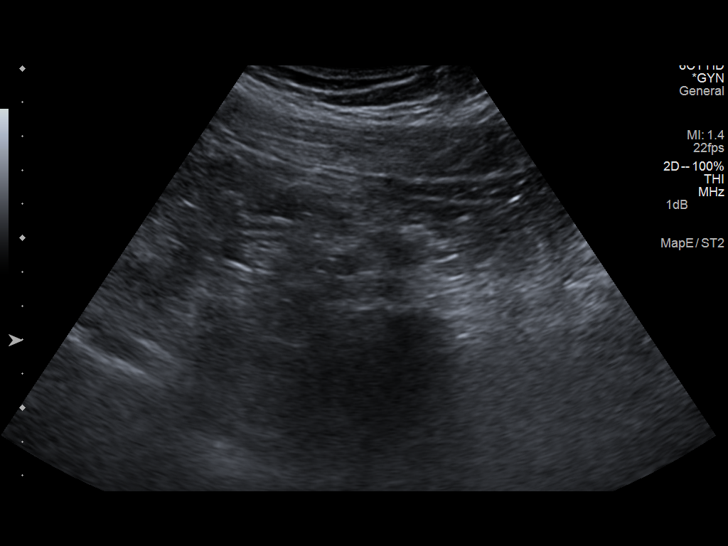
[im 10/58]
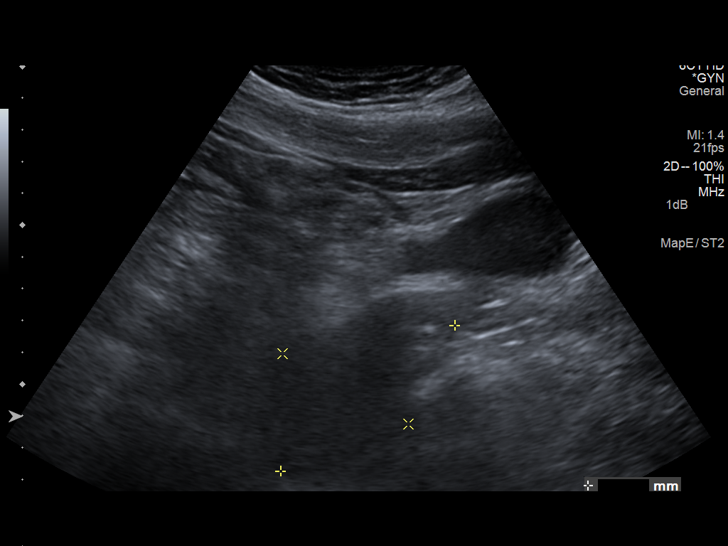
[im 15/58]
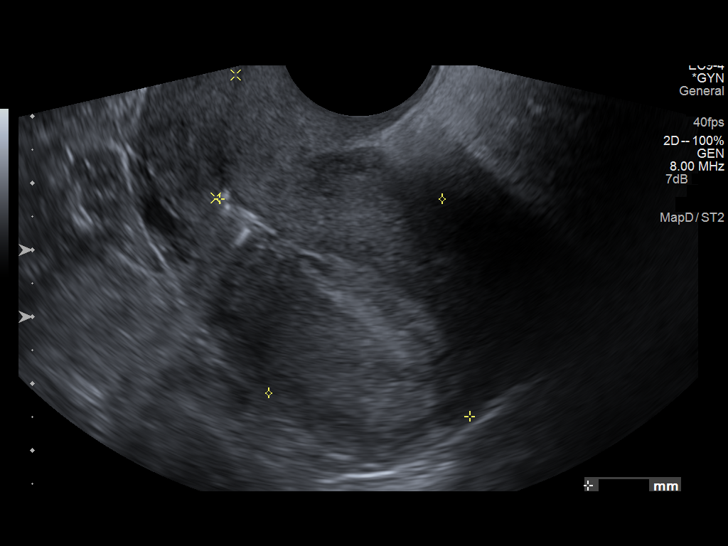
[im 20/58]
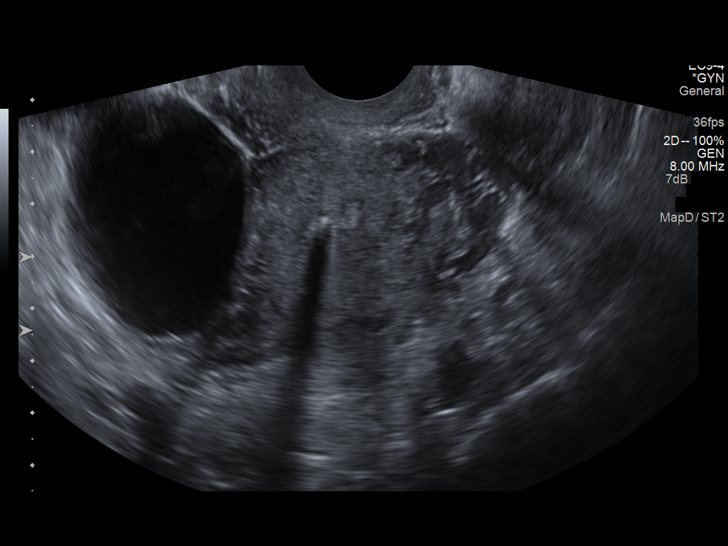
[im 22/58]
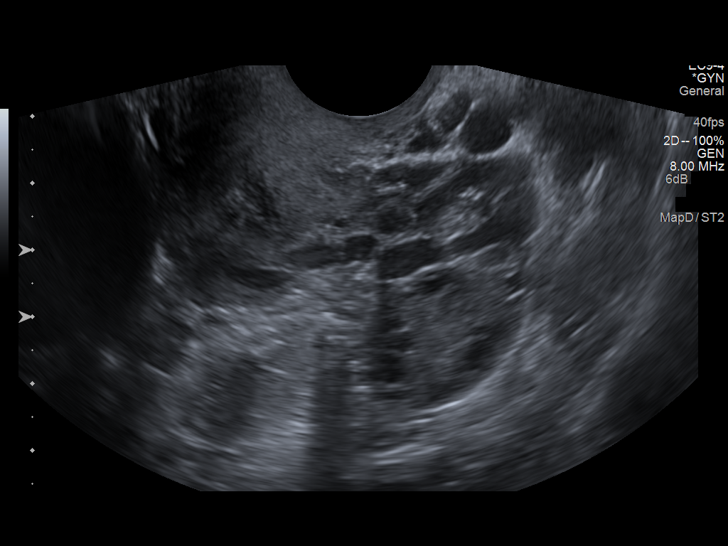
[im 27/58]
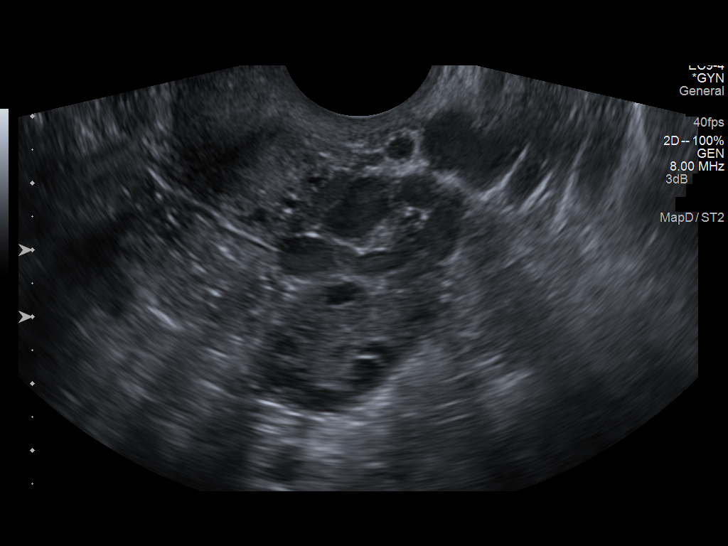
[im 31/58]
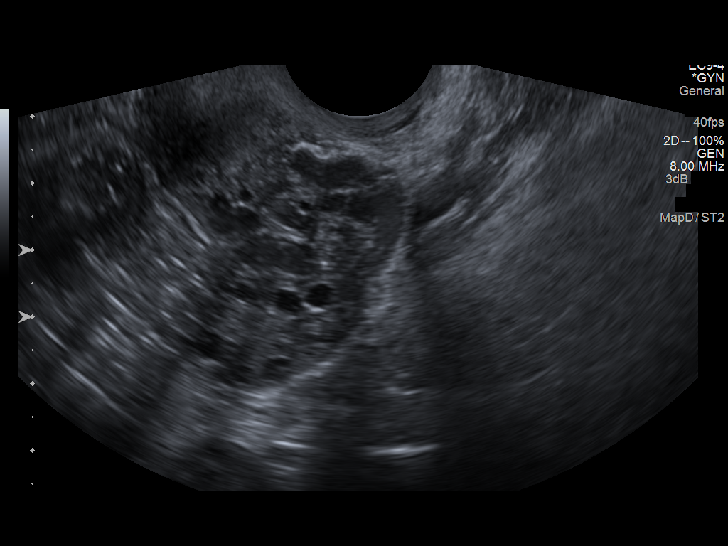
[im 36/58]
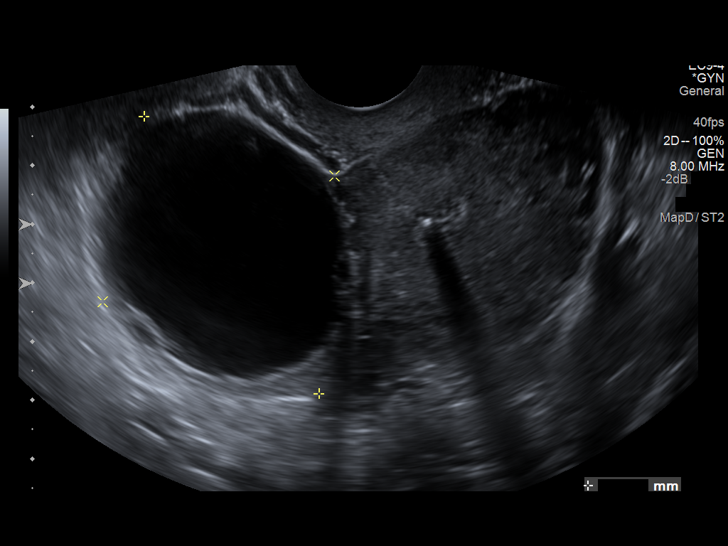
[im 39/58]
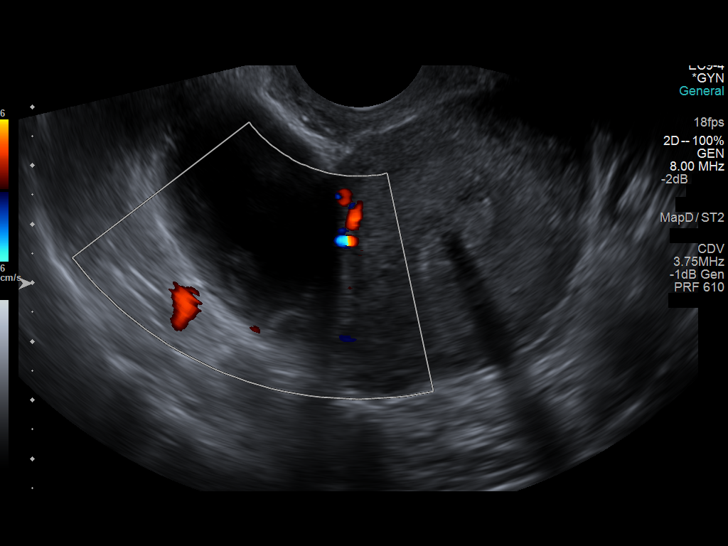
[im 43/58]
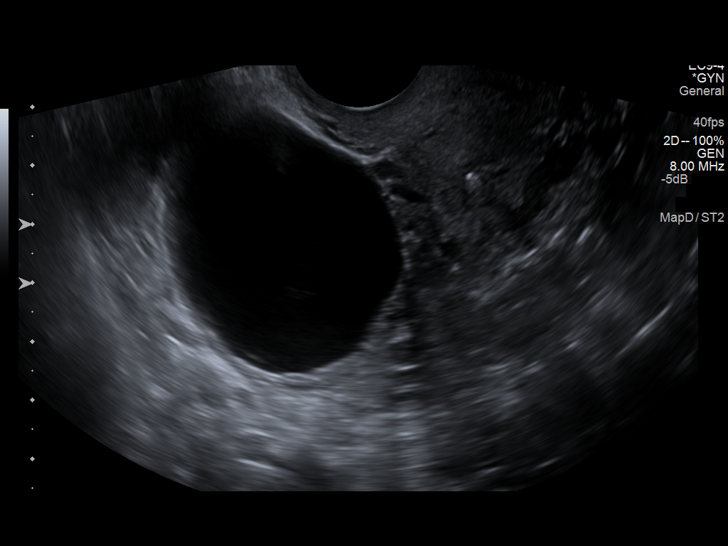
[im 48/58]
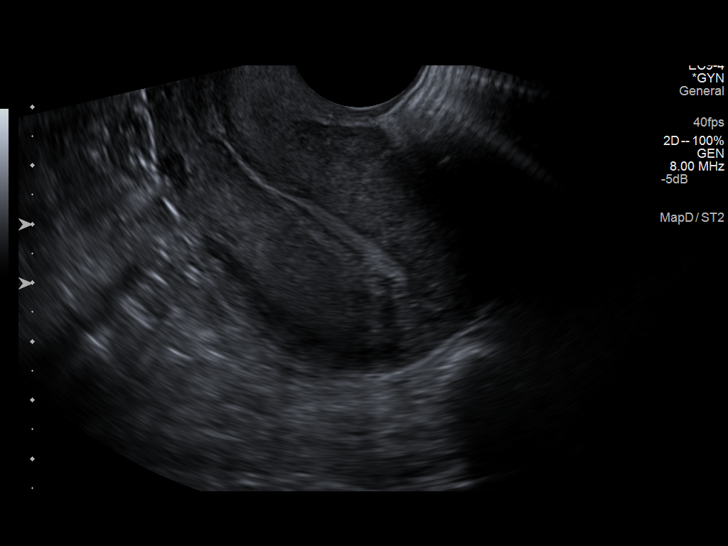
[im 53/58]
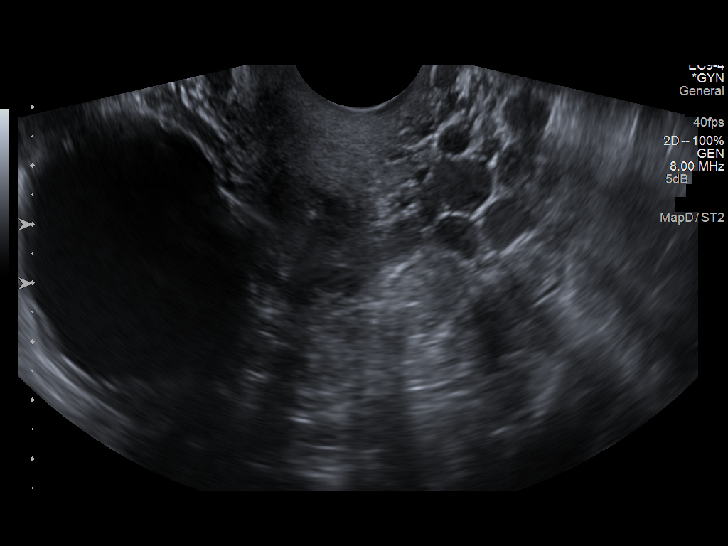
[im 58/58]
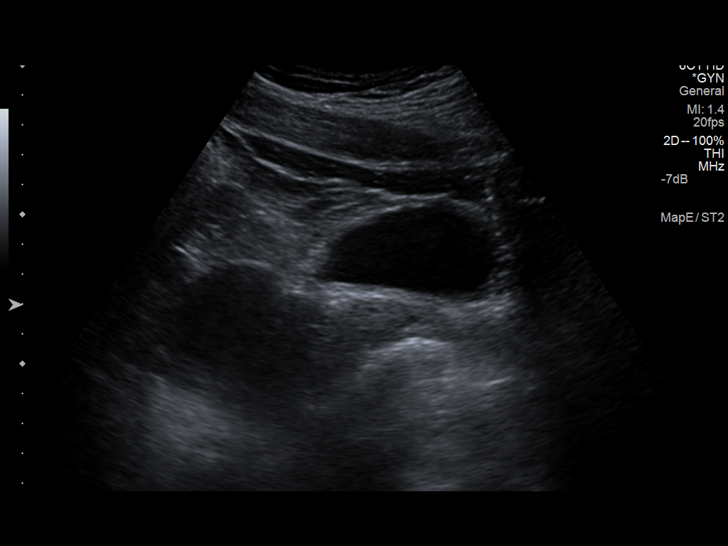

[14 of 25 positions shown; findings below may reference images not displayed]

FINDINGS: Uterus

Measurements: 6.9 x 3.9 x 4.7 cm. Uterus is retroverted. No focal
abnormality.

Endometrium

Thickness: 10 mm in thickness. IUD noted within the endometrium. No
focal abnormality visualized.

Right ovary

Measurements: 5.6 x 4.5 x 4.4 cm. 4.7 cm simple appearing right
ovarian cyst. Color blood flow noted within the right of ovary.

Left ovary

Measurements: 3.0 x 2.0 x 2.6 cm. Normal appearance/no adnexal mass.

Other findings

No free fluid.
IMPRESSION: 4.7 cm simple appearing right ovarian cyst.

IUD within the endometrium.

## 2015-03-30 ENCOUNTER — Encounter: Payer: Self-pay | Admitting: Family Medicine

## 2015-03-30 ENCOUNTER — Ambulatory Visit: Payer: Self-pay | Attending: Family Medicine | Admitting: Family Medicine

## 2015-03-30 VITALS — BP 129/84 | HR 74 | Temp 98.6°F | Resp 19 | Wt 206.4 lb

## 2015-03-30 DIAGNOSIS — Z23 Encounter for immunization: Secondary | ICD-10-CM | POA: Insufficient documentation

## 2015-03-30 DIAGNOSIS — J45998 Other asthma: Secondary | ICD-10-CM

## 2015-03-30 DIAGNOSIS — R030 Elevated blood-pressure reading, without diagnosis of hypertension: Secondary | ICD-10-CM

## 2015-03-30 DIAGNOSIS — IMO0001 Reserved for inherently not codable concepts without codable children: Secondary | ICD-10-CM | POA: Insufficient documentation

## 2015-03-30 DIAGNOSIS — Z79899 Other long term (current) drug therapy: Secondary | ICD-10-CM | POA: Insufficient documentation

## 2015-03-30 DIAGNOSIS — J4530 Mild persistent asthma, uncomplicated: Secondary | ICD-10-CM | POA: Insufficient documentation

## 2015-03-30 DIAGNOSIS — Z0000 Encounter for general adult medical examination without abnormal findings: Secondary | ICD-10-CM

## 2015-03-30 DIAGNOSIS — IMO0002 Reserved for concepts with insufficient information to code with codable children: Secondary | ICD-10-CM | POA: Insufficient documentation

## 2015-03-30 DIAGNOSIS — I10 Essential (primary) hypertension: Secondary | ICD-10-CM | POA: Insufficient documentation

## 2015-03-30 DIAGNOSIS — Z87891 Personal history of nicotine dependence: Secondary | ICD-10-CM | POA: Insufficient documentation

## 2015-03-30 LAB — POCT URINALYSIS DIPSTICK
Bilirubin, UA: NEGATIVE
Glucose, UA: NEGATIVE
Ketones, UA: NEGATIVE
Leukocytes, UA: NEGATIVE
Nitrite, UA: NEGATIVE
Protein, UA: NEGATIVE
Spec Grav, UA: 1.02
UROBILINOGEN UA: 0.2
pH, UA: 6.5

## 2015-03-30 MED ORDER — ALBUTEROL SULFATE HFA 108 (90 BASE) MCG/ACT IN AERS: 2.0000 | INHALATION_SPRAY | Freq: Four times a day (QID) | RESPIRATORY_TRACT | Status: DC | PRN

## 2015-03-30 MED ORDER — BUDESONIDE-FORMOTEROL FUMARATE 160-4.5 MCG/ACT IN AERO: 2.0000 | INHALATION_SPRAY | Freq: Two times a day (BID) | RESPIRATORY_TRACT | Status: DC

## 2015-03-30 MED ORDER — AMLODIPINE BESYLATE 5 MG PO TABS: 5.0000 mg | ORAL_TABLET | Freq: Every day | ORAL | Status: DC

## 2015-03-30 NOTE — Progress Notes (Signed)
Patient ID: Catherine Porter, female   DOB: 09-Jan-1991, 24 y.o.   MRN: [id number]   Subjective:  Patient ID: Catherine Porter, female    DOB: [birth year]  Age: 24 y.o. MRN: [id number]  CC: Asthma and Hypertension   HPI Catherine Porter presents for   1. Asthma: out of albuterol and symbicort for two weeks. Exercising. No cough, CP or SOB.  Has nighttime symptoms 2-3 times per week. Using inhaler 3 or more times a day. reports asthma is somewhat controlled.   2. Elevated BP: admits to occasional headaches. No CP, SOB or swelling. Has hx of HTN in maternal aunt. Has personal hx of gestational HTN.   Social History  Substance Use Topics  . Smoking status: Former Smoker -- 4 years    Types: Cigarettes    Quit date: 10/06/2013  . Smokeless tobacco: Never Used  . Alcohol Use: No     Comment: 02/22/2014 "maybe once/month"    Outpatient Prescriptions Prior to Visit  Medication Sig Dispense Refill  . albuterol (VENTOLIN HFA) 108 (90 BASE) MCG/ACT inhaler Inhale 1 puff into the lungs every 6 (six) hours as needed for wheezing or shortness of breath. 1 Inhaler 1  . budesonide-formoterol (SYMBICORT) 160-4.5 MCG/ACT inhaler Inhale 2 puffs into the lungs 2 (two) times daily. 1 Inhaler 2  . levonorgestrel (MIRENA) 20 MCG/24HR IUD 1 each by Intrauterine route continuous.      No facility-administered medications prior to visit.    ROS Review of Systems  Constitutional: Negative for fever and chills.  Eyes: Negative for visual disturbance.  Respiratory: Negative for shortness of breath.   Cardiovascular: Negative for chest pain.  Gastrointestinal: Negative for abdominal pain and blood in stool.  Musculoskeletal: Negative for back pain and arthralgias.  Skin: Negative for rash.  Allergic/Immunologic: Negative for immunocompromised state.  Neurological: Positive for headaches.  Hematological: Negative for adenopathy. Does not bruise/bleed easily.  Psychiatric/Behavioral: Negative for suicidal  ideas and dysphoric mood.  GAD-7: score of 5. 1-3,4,7. 2-6   Objective:  BP 129/84 mmHg  Pulse 74  Temp(Src) 98.6 F (37 C) (Oral)  Resp 19  Wt 206 lb 6.4 oz (93.622 kg)  SpO2 99%  PF 200 L/min  BP/Weight 03/30/2015 08/10/2014 06/16/2014  Systolic BP 129 142 133  Diastolic BP 84 82 89  Wt. (Lbs) 206.4 207 -  BMI 36.57 36.68 -   Physical Exam  Constitutional: She is oriented to person, place, and time. She appears well-developed and well-nourished. No distress.  HENT:  Head: Normocephalic and atraumatic.  Cardiovascular: Normal rate, regular rhythm, normal heart sounds and intact distal pulses.   Pulmonary/Chest: Effort normal and breath sounds normal.  Musculoskeletal: She exhibits no edema.  Neurological: She is alert and oriented to person, place, and time.  Skin: Skin is warm and dry. No rash noted.  Psychiatric: She has a normal mood and affect.    Assessment & Plan:   Problem List Items Addressed This Visit    Elevated BP    Elevated BP normalized on recheck Hx of gestational HTN  UA, BMP and TSH check today Discussed low salt and HTN prevention       Relevant Orders   POCT urinalysis dipstick   TSH   Basic Metabolic Panel   RESOLVED: Essential hypertension, benign (Chronic)    A      Persistent asthma - Primary (Chronic)    A: persistent asthma w/o exacerbation P: Refilled symbicort and albuterol  Relevant Medications   budesonide-formoterol (SYMBICORT) 160-4.5 MCG/ACT inhaler   albuterol (VENTOLIN HFA) 108 (90 BASE) MCG/ACT inhaler      No orders of the defined types were placed in this encounter.    Follow-up: No Follow-up on file.   Dessa PhiJosalyn Clydean Posas MD

## 2015-03-30 NOTE — Assessment & Plan Note (Signed)
A: persistent asthma w/o exacerbation P: Refilled symbicort and albuterol

## 2015-03-30 NOTE — Assessment & Plan Note (Signed)
A 

## 2015-03-30 NOTE — Assessment & Plan Note (Signed)
Elevated BP normalized on recheck Hx of gestational HTN  UA, BMP and TSH check today Discussed low salt and HTN prevention

## 2015-03-30 NOTE — Progress Notes (Signed)
Establish Care Pt states she needs refill on Symbicort and Albuterol 200 L Peak Flow Meter output

## 2015-03-30 NOTE — Patient Instructions (Addendum)
Catherine Porter was seen today for asthma and hypertension.  Diagnoses and all orders for this visit:  Persistent asthma -     budesonide-formoterol (SYMBICORT) 160-4.5 MCG/ACT inhaler; Inhale 2 puffs into the lungs 2 (two) times daily. -     albuterol (VENTOLIN HFA) 108 (90 BASE) MCG/ACT inhaler; Inhale 2 puffs into the lungs every 6 (six) hours as needed for wheezing or shortness of breath.  Elevated BP -     POCT urinalysis dipstick -     TSH -     Basic Metabolic Panel  Other orders -     Discontinue: amLODipine (NORVASC) 5 MG tablet; Take 1 tablet (5 mg total) by mouth daily.   Repeat BP is normal. No need for norvasc. We will still check on kidneys and thyroid. Continue exercise, vegetables, low salt diet  F/u in 6 weeks for pap and BP recheck   Dr. Armen PickupFunches   Hypertension Hypertension, commonly called high blood pressure, is when the force of blood pumping through your arteries is too strong. Your arteries are the blood vessels that carry blood from your heart throughout your body. A blood pressure reading consists of a higher number over a lower number, such as 110/72. The higher number (systolic) is the pressure inside your arteries when your heart pumps. The lower number (diastolic) is the pressure inside your arteries when your heart relaxes. Ideally you want your blood pressure below 120/80. Hypertension forces your heart to work harder to pump blood. Your arteries may become narrow or stiff. Having untreated or uncontrolled hypertension can cause heart attack, stroke, kidney disease, and other problems. RISK FACTORS Some risk factors for high blood pressure are controllable. Others are not.  Risk factors you cannot control include:   Race. You may be at higher risk if you are African American.  Age. Risk increases with age.  Gender. Men are at higher risk than women before age 23 years. After age 23, women are at higher risk than men. Risk factors you can control include:  Not  getting enough exercise or physical activity.  Being overweight.  Getting too much fat, sugar, calories, or salt in your diet.  Drinking too much alcohol. SIGNS AND SYMPTOMS Hypertension does not usually cause signs or symptoms. Extremely high blood pressure (hypertensive crisis) may cause headache, anxiety, shortness of breath, and nosebleed. DIAGNOSIS To check if you have hypertension, your health care provider will measure your blood pressure while you are seated, with your arm held at the level of your heart. It should be measured at least twice using the same arm. Certain conditions can cause a difference in blood pressure between your right and left arms. A blood pressure reading that is higher than normal on one occasion does not mean that you need treatment. If it is not clear whether you have high blood pressure, you may be asked to return on a different day to have your blood pressure checked again. Or, you may be asked to monitor your blood pressure at home for 1 or more weeks. TREATMENT Treating high blood pressure includes making lifestyle changes and possibly taking medicine. Living a healthy lifestyle can help lower high blood pressure. You may need to change some of your habits. Lifestyle changes may include:  Following the DASH diet. This diet is high in fruits, vegetables, and whole grains. It is low in salt, red meat, and added sugars.  Keep your sodium intake below 2,300 mg per day.  Getting at least 30-45 minutes of  aerobic exercise at least 4 times per week.  Losing weight if necessary.  Not smoking.  Limiting alcoholic beverages.  Learning ways to reduce stress. Your health care provider may prescribe medicine if lifestyle changes are not enough to get your blood pressure under control, and if one of the following is true:  You are 76-60 years of age and your systolic blood pressure is above 140.  You are 73 years of age or older, and your systolic blood  pressure is above 150.  Your diastolic blood pressure is above 90.  You have diabetes, and your systolic blood pressure is over 140 or your diastolic blood pressure is over 90.  You have kidney disease and your blood pressure is above 140/90.  You have heart disease and your blood pressure is above 140/90. Your personal target blood pressure may vary depending on your medical conditions, your age, and other factors. HOME CARE INSTRUCTIONS  Have your blood pressure rechecked as directed by your health care provider.   Take medicines only as directed by your health care provider. Follow the directions carefully. Blood pressure medicines must be taken as prescribed. The medicine does not work as well when you skip doses. Skipping doses also puts you at risk for problems.  Do not smoke.   Monitor your blood pressure at home as directed by your health care provider. SEEK MEDICAL CARE IF:   You think you are having a reaction to medicines taken.  You have recurrent headaches or feel dizzy.  You have swelling in your ankles.  You have trouble with your vision. SEEK IMMEDIATE MEDICAL CARE IF:  You develop a severe headache or confusion.  You have unusual weakness, numbness, or feel faint.  You have severe chest or abdominal pain.  You vomit repeatedly.  You have trouble breathing. MAKE SURE YOU:   Understand these instructions.  Will watch your condition.  Will get help right away if you are not doing well or get worse.   This information is not intended to replace advice given to you by your health care provider. Make sure you discuss any questions you have with your health care provider.   Document Released: 05/27/2005 Document Revised: 10/11/2014 Document Reviewed: 03/19/2013 Elsevier Interactive Patient Education 2016 Elsevier Inc.  DASH Eating Plan DASH stands for "Dietary Approaches to Stop Hypertension." The DASH eating plan is a healthy eating plan that has  been shown to reduce high blood pressure (hypertension). Additional health benefits may include reducing the risk of type 2 diabetes mellitus, heart disease, and stroke. The DASH eating plan may also help with weight loss. WHAT DO I NEED TO KNOW ABOUT THE DASH EATING PLAN? For the DASH eating plan, you will follow these general guidelines:  Choose foods with a percent daily value for sodium of less than 5% (as listed on the food label).  Use salt-free seasonings or herbs instead of table salt or sea salt.  Check with your health care provider or pharmacist before using salt substitutes.  Eat lower-sodium products, often labeled as "lower sodium" or "no salt added."  Eat fresh foods.  Eat more vegetables, fruits, and low-fat dairy products.  Choose whole grains. Look for the word "whole" as the first word in the ingredient list.  Choose fish and skinless chicken or [place] more often than red meat. Limit fish, poultry, and meat to 6 oz (170 g) each day.  Limit sweets, desserts, sugars, and sugary drinks.  Choose heart-healthy fats.  Limit cheese to  1 oz (28 g) per day.  Eat more home-cooked food and less restaurant, buffet, and fast food.  Limit fried foods.  Cook foods using methods other than frying.  Limit canned vegetables. If you do use them, rinse them well to decrease the sodium.  When eating at a restaurant, ask that your food be prepared with less salt, or no salt if possible. WHAT FOODS CAN I EAT? Seek help from a dietitian for individual calorie needs. Grains Whole grain or whole wheat bread. Brown rice. Whole grain or whole wheat pasta. Quinoa, bulgur, and whole grain cereals. Low-sodium cereals. Corn or whole wheat flour tortillas. Whole grain cornbread. Whole grain crackers. Low-sodium crackers. Vegetables Fresh or frozen vegetables (raw, steamed, roasted, or grilled). Low-sodium or reduced-sodium tomato and vegetable juices. Low-sodium or reduced-sodium tomato  sauce and paste. Low-sodium or reduced-sodium canned vegetables.  Fruits All fresh, canned (in natural juice), or frozen fruits. Meat and Other Protein Products Ground beef (85% or leaner), grass-fed beef, or beef trimmed of fat. Skinless chicken or [place]. Ground chicken or [place]. Pork trimmed of fat. All fish and seafood. Eggs. Dried beans, peas, or lentils. Unsalted nuts and seeds. Unsalted canned beans. Dairy Low-fat dairy products, such as skim or 1% milk, 2% or reduced-fat cheeses, low-fat ricotta or cottage cheese, or plain low-fat yogurt. Low-sodium or reduced-sodium cheeses. Fats and Oils Tub margarines without trans fats. Light or reduced-fat mayonnaise and salad dressings (reduced sodium). Avocado. Safflower, olive, or canola oils. Natural peanut or almond butter. Other Unsalted popcorn and pretzels. The items listed above may not be a complete list of recommended foods or beverages. Contact your dietitian for more options. WHAT FOODS ARE NOT RECOMMENDED? Grains White bread. White pasta. White rice. Refined cornbread. Bagels and croissants. Crackers that contain trans fat. Vegetables Creamed or fried vegetables. Vegetables in a cheese sauce. Regular canned vegetables. Regular canned tomato sauce and paste. Regular tomato and vegetable juices. Fruits Dried fruits. Canned fruit in light or heavy syrup. Fruit juice. Meat and Other Protein Products Fatty cuts of meat. Ribs, chicken wings, bacon, sausage, bologna, salami, chitterlings, fatback, hot dogs, bratwurst, and packaged luncheon meats. Salted nuts and seeds. Canned beans with salt. Dairy Whole or 2% milk, cream, half-and-half, and cream cheese. Whole-fat or sweetened yogurt. Full-fat cheeses or blue cheese. Nondairy creamers and whipped toppings. Processed cheese, cheese spreads, or cheese curds. Condiments Onion and garlic salt, seasoned salt, table salt, and sea salt. Canned and packaged gravies. Worcestershire sauce. Tartar  sauce. Barbecue sauce. Teriyaki sauce. Soy sauce, including reduced sodium. Steak sauce. Fish sauce. Oyster sauce. Cocktail sauce. Horseradish. Ketchup and mustard. Meat flavorings and tenderizers. Bouillon cubes. Hot sauce. Tabasco sauce. Marinades. Taco seasonings. Relishes. Fats and Oils Butter, stick margarine, lard, shortening, ghee, and bacon fat. Coconut, palm kernel, or palm oils. Regular salad dressings. Other Pickles and olives. Salted popcorn and pretzels. The items listed above may not be a complete list of foods and beverages to avoid. Contact your dietitian for more information. WHERE CAN I FIND MORE INFORMATION? National Heart, Lung, and Blood Institute: [employer]   This information is not intended to replace advice given to you by your health care provider. Make sure you discuss any questions you have with your health care provider.   Document Released: 05/16/2011 Document Revised: 06/17/2014 Document Reviewed: 03/31/2013 Elsevier Interactive Patient Education [employer]

## 2015-03-31 LAB — BASIC METABOLIC PANEL
BUN: 6 mg/dL — ABNORMAL LOW (ref 7–25)
CO2: 25 mmol/L (ref 20–31)
Calcium: 9.2 mg/dL (ref 8.6–10.2)
Chloride: 103 mmol/L (ref 98–110)
Creat: 0.65 mg/dL (ref 0.50–1.10)
GLUCOSE: 111 mg/dL — AB (ref 65–99)
POTASSIUM: 4.8 mmol/L (ref 3.5–5.3)
SODIUM: 141 mmol/L (ref 135–146)

## 2015-03-31 LAB — TSH: TSH: 1.045 u[IU]/mL (ref 0.350–4.500)

## 2015-04-05 ENCOUNTER — Telehealth: Payer: Self-pay | Admitting: *Deleted

## 2015-04-05 NOTE — Telephone Encounter (Signed)
Date of birth verified by pt  Normal lab results given to pt  Pt verbalized understanding  

## 2015-04-05 NOTE — Telephone Encounter (Signed)
-----   Message from Dessa PhiJosalyn Funches, MD sent at 03/31/2015  8:23 AM EDT ----- Labs normal

## 2015-06-11 ENCOUNTER — Encounter (HOSPITAL_COMMUNITY): Payer: Self-pay | Admitting: *Deleted

## 2015-06-11 ENCOUNTER — Emergency Department (HOSPITAL_COMMUNITY)
Admission: EM | Admit: 2015-06-11 | Discharge: 2015-06-11 | Disposition: A | Discharge status: Home / Self Care | Payer: Self-pay | Attending: Emergency Medicine | Admitting: Emergency Medicine

## 2015-06-11 ENCOUNTER — Emergency Department (HOSPITAL_COMMUNITY): Payer: Self-pay

## 2015-06-11 DIAGNOSIS — Z87891 Personal history of nicotine dependence: Secondary | ICD-10-CM | POA: Insufficient documentation

## 2015-06-11 DIAGNOSIS — Z79899 Other long term (current) drug therapy: Secondary | ICD-10-CM | POA: Insufficient documentation

## 2015-06-11 DIAGNOSIS — J45901 Unspecified asthma with (acute) exacerbation: Secondary | ICD-10-CM | POA: Insufficient documentation

## 2015-06-11 DIAGNOSIS — Z8669 Personal history of other diseases of the nervous system and sense organs: Secondary | ICD-10-CM | POA: Insufficient documentation

## 2015-06-11 DIAGNOSIS — M546 Pain in thoracic spine: Secondary | ICD-10-CM | POA: Insufficient documentation

## 2015-06-11 DIAGNOSIS — Z7951 Long term (current) use of inhaled steroids: Secondary | ICD-10-CM | POA: Insufficient documentation

## 2015-06-11 MED ORDER — IPRATROPIUM BROMIDE 0.02 % IN SOLN
1.0000 mg | Freq: Once | RESPIRATORY_TRACT | Status: AC
  Administered 2015-06-11: 1 mg via RESPIRATORY_TRACT
  Filled 2015-06-11: qty 5

## 2015-06-11 MED ORDER — IPRATROPIUM-ALBUTEROL 0.5-2.5 (3) MG/3ML IN SOLN
3.0000 mL | Freq: Once | RESPIRATORY_TRACT | Status: AC
  Administered 2015-06-11: 3 mL via RESPIRATORY_TRACT
  Filled 2015-06-11: qty 3

## 2015-06-11 MED ORDER — PREDNISONE 20 MG PO TABS
40.0000 mg | ORAL_TABLET | Freq: Once | ORAL | Status: AC
  Administered 2015-06-11: 40 mg via ORAL
  Filled 2015-06-11: qty 2

## 2015-06-11 MED ORDER — ALBUTEROL SULFATE (2.5 MG/3ML) 0.083% IN NEBU: 5.0000 mg | INHALATION_SOLUTION | Freq: Once | RESPIRATORY_TRACT | Status: DC

## 2015-06-11 MED ORDER — PREDNISONE 20 MG PO TABS: 40.0000 mg | ORAL_TABLET | Freq: Once | ORAL | Status: DC

## 2015-06-11 MED ORDER — ALBUTEROL (5 MG/ML) CONTINUOUS INHALATION SOLN
10.0000 mg/h | INHALATION_SOLUTION | Freq: Once | RESPIRATORY_TRACT | Status: AC
  Administered 2015-06-11: 10 mg/h via RESPIRATORY_TRACT
  Filled 2015-06-11: qty 20

## 2015-06-11 NOTE — ED Provider Notes (Signed)
CSN: [id number]     Arrival date & time 06/11/15  1339 History   First MD Initiated Contact with Patient 06/11/15 1449     Chief Complaint  Patient presents with  . Shortness of Breath     (Consider location/radiation/quality/duration/timing/severity/associated sxs/prior Treatment) HPI   7-10 days of progressively worsening wheeze, cough, sob similar to previous asthma exacerbations. No nausea, vomiting, diarrhea, fever, chest pain. Has some upper back pain associated with coughing. Worse with coughing, better with resting.   Past Medical History  Diagnosis Date  . Asthma   . Migraine     "not often anymore" (02/22/2014)  . Arthritis     "fingers" (02/22/2014)  . Gestational HTN 2012    when pregnant with son    Past Surgical History  Procedure Laterality Date  . Cholecystectomy  2014  . Tonsillectomy  ~ 2008  . Tonsillectomy/adenoidectomy/turbinate reduction  ~ 2008   Family History  Problem Relation Age of Onset  . Anesthesia problems Neg Hx   . Asthma Neg Hx    Social History  Substance Use Topics  . Smoking status: Former Smoker -- 4 years    Types: Cigarettes    Quit date: 10/06/2013  . Smokeless tobacco: Never Used  . Alcohol Use: No     Comment: 02/22/2014 "maybe once/month"   OB History    Gravida Para Term Preterm AB TAB SAB Ectopic Multiple Living   2 2 2  0 0 0 0 0 0 2      Obstetric Comments   2 children, one boy and girl     Review of Systems  Constitutional: Negative for fever and fatigue.  Respiratory: Positive for cough, shortness of breath and wheezing.   Cardiovascular: Negative for chest pain.  Musculoskeletal: Positive for back pain.  All other systems reviewed and are negative.     Allergies  Fish allergy  Home Medications   Prior to Admission medications   Medication Sig Start Date End Date Taking? Authorizing Provider  albuterol (VENTOLIN HFA) 108 (90 BASE) MCG/ACT inhaler Inhale 2 puffs into the lungs every 6 (six) hours as  needed for wheezing or shortness of breath. 03/30/15  Yes Josalyn Funches, MD  budesonide-formoterol (SYMBICORT) 160-4.5 MCG/ACT inhaler Inhale 2 puffs into the lungs 2 (two) times daily. 03/30/15  Yes Josalyn Funches, MD  predniSONE (DELTASONE) 20 MG tablet Take 2 tablets (40 mg total) by mouth once. 06/12/15   Marily MemosJason Falisha Osment, MD   BP 129/86 mmHg  Pulse 92  Temp(Src) 99.1 F (37.3 C) (Oral)  Resp 20  SpO2 95%  LMP 05/29/2015 Physical Exam  Constitutional: She appears well-developed and well-nourished.  HENT:  Head: Normocephalic and atraumatic.  Eyes: Conjunctivae are normal. Pupils are equal, round, and reactive to light.  Neck: Normal range of motion.  Cardiovascular: Normal rate and regular rhythm.   Pulmonary/Chest: No stridor. Tachypnea noted. No respiratory distress. She has decreased breath sounds. She has wheezes.  Abdominal: Soft. She exhibits no distension. There is no rebound and no guarding.  Musculoskeletal: Normal range of motion. She exhibits no edema or tenderness.  Neurological: She is alert.  Skin: Skin is warm and dry. No erythema.  Psychiatric: She has a normal mood and affect.  Nursing note and vitals reviewed.   ED Course  Procedures (including critical care time) Labs Review Labs Reviewed - No data to display  Imaging Review Dg Chest 2 View  06/11/2015  CLINICAL DATA:  Chest pain and cough EXAM: CHEST  2 VIEW  COMPARISON:  None. FINDINGS: The heart size and mediastinal contours are within normal limits. Both lungs are clear. The visualized skeletal structures are unremarkable. IMPRESSION: No active cardiopulmonary disease. Electronically Signed   By: Jolaine Click M.D.   On: 06/11/2015 14:31   I have personally reviewed and evaluated these images and lab results as part of my medical decision-making.   EKG Interpretation None      MDM   Final diagnoses:  Asthma, unspecified asthma severity, with acute exacerbation   25 yo F w/ likely asthma  exacerbation. Already had one duoneb, will do a continuous and start prednisone. Doubt infectionat this time. Will reeval for likely dc.   Reevaluated: just finished continuous, improved WOB, improved BS, decreased wheezing, will reevaluate in about an hour and dc if still improved.   Reevaluation the patient still improved work of breathing and slight wheezing but overall much improved on arrival. Patient was discharged on every 4 hours albuterol and prednisone as well.  Marily Memos, MD 06/11/15 [phone]

## 2015-06-11 NOTE — ED Notes (Signed)
Patient states she began having an asthma exacerbation x1 week.  Patient refilled her Albuterol rx on Monday 12/26, but pharmacy was out of Symbicort so she could not get that [facility].  Patient states she has been using her albuterol inhaler 5x/day over past week with some relief.  Patient presents with productive cough, but denies N/V/D and fever.  Patient has inspiratory/expiratory wheezing in all lung fields.

## 2015-06-11 NOTE — ED Notes (Signed)
Patient's breathing is improved after duoneb.  She is still wheezing, but better after tx.

## 2015-06-11 NOTE — ED Notes (Signed)
Pt is currently receiving neb tx.

## 2015-06-13 ENCOUNTER — Emergency Department (INDEPENDENT_AMBULATORY_CARE_PROVIDER_SITE_OTHER)
Admission: EM | Admit: 2015-06-13 | Discharge: 2015-06-13 | Disposition: A | Discharge status: Home / Self Care | Payer: Self-pay | Source: Home / Self Care | Attending: Emergency Medicine | Admitting: Emergency Medicine

## 2015-06-13 ENCOUNTER — Encounter (HOSPITAL_COMMUNITY): Payer: Self-pay | Admitting: Emergency Medicine

## 2015-06-13 DIAGNOSIS — J4541 Moderate persistent asthma with (acute) exacerbation: Secondary | ICD-10-CM

## 2015-06-13 MED ORDER — FLUTICASONE FUROATE-VILANTEROL 100-25 MCG/INH IN AEPB: INHALATION_SPRAY | RESPIRATORY_TRACT | Status: DC

## 2015-06-13 MED FILL — **BREO ELLIPTA 100-25 MCG I: 100-25MCG | 28 days supply | Qty: 56 | Fill #0

## 2015-06-13 MED FILL — VENTOLIN HFA 90 MCG INHALER: 108 (90 BAS | 25 days supply | Qty: 18 | Fill #2

## 2015-06-13 NOTE — ED Notes (Signed)
Here for a f/u for asthma flare up... Seen at [facility] on 06/11/2015 Sx include wheezing and cough Also needs a Rx for Breo O2 = 93% room air  A&O x4... No acute distress.

## 2015-06-13 NOTE — ED Provider Notes (Signed)
CSN: [id number]     Arrival date & time 06/13/15  1319 History   First MD Initiated Contact with Patient 06/13/15 1617     Chief Complaint  Patient presents with  . Follow-up   (Consider location/radiation/quality/duration/timing/severity/associated sxs/prior Treatment) HPI Comments: 25 year old female with asthma has been having increased asthma attacks and poor control over the past week or 2. She was [facility] on January 1 for asthma exacerbation. She received multiple nebulizers, prescribed prednisone and discharged. The patient went to get her Symbicort refilled at [facility] in they are out of Symbicort and will not have it for several weeks. [facility] physician [facility] not [facility]. She was then advised by the pharmacist to go to the urgent care to receive [facility] because [facility] had that in stock. This is another L ABA and corticosteroid inhaler. The patient states she feels fine now. And just needs the prescription as soon as possible so she can go get it filled before the pharmacy closes today at 5:00.   Past Medical History  Diagnosis Date  . Asthma   . Migraine     "not often anymore" (02/22/2014)  . Arthritis     "fingers" (02/22/2014)  . Gestational HTN 2012    when pregnant with son    Past Surgical History  Procedure Laterality Date  . Cholecystectomy  2014  . Tonsillectomy  ~ 2008  . Tonsillectomy/adenoidectomy/turbinate reduction  ~ 2008   Family History  Problem Relation Age of Onset  . Anesthesia problems Neg Hx   . Asthma Neg Hx    Social History  Substance Use Topics  . Smoking status: Former Smoker -- 4 years    Types: Cigarettes    Quit date: 10/06/2013  . Smokeless tobacco: Never Used  . Alcohol Use: No     Comment: 02/22/2014 "maybe once/month"   OB History    Gravida Para Term Preterm AB TAB SAB Ectopic Multiple Living   2 2 2  0 0 0 0 0 0 2      Obstetric Comments    2 children, one boy and girl     Review of Systems  Constitutional: Negative.   HENT: Negative.   Respiratory: Negative.   Cardiovascular: Negative.   Neurological: Negative.     Allergies  Fish allergy  Home Medications   Prior to Admission medications   Medication Sig Start Date End Date Taking? Authorizing Provider  albuterol (VENTOLIN HFA) 108 (90 BASE) MCG/ACT inhaler Inhale 2 puffs into the lungs every 6 (six) hours as needed for wheezing or shortness of breath. 03/30/15   Josalyn Funches, MD  budesonide-formoterol (SYMBICORT) 160-4.5 MCG/ACT inhaler Inhale 2 puffs into the lungs 2 (two) times daily. 03/30/15   Dessa PhiJosalyn Funches, MD  Fluticasone Furoate-Vilanterol 100-25 MCG/INH AEPB One inhalation daily 06/13/15   Hayden Rasmussenavid Ioanna Colquhoun, NP  predniSONE (DELTASONE) 20 MG tablet Take 2 tablets (40 mg total) by mouth once. 06/12/15   Marily MemosJason Mesner, MD   Meds Ordered and Administered this Visit  Medications - No data to display  BP 130/79 mmHg  Pulse 91  Temp(Src) 97.9 F (36.6 C) (Oral)  Resp 20  SpO2 93%  LMP 05/29/2015 No data found.   Physical Exam  Constitutional: She is oriented to person, place, and time. She appears well-developed and well-nourished. No distress.  Eyes: EOM are normal.  Cardiovascular: Normal rate, regular rhythm and normal heart sounds.  Pulmonary/Chest: Effort normal. She has wheezes.  Although there is no respiratory distress. And while sitting the patient appears to have even and nonlabored respirations her lung sounds are with moderate expiratory wheezing and coarseness. No crackles.  Musculoskeletal: She exhibits no edema.  Neurological: She is alert and oriented to person, place, and time. She exhibits normal muscle tone.  Skin: Skin is warm and dry. No rash noted.  Nursing note and vitals reviewed.   ED Course  Procedures (including critical care time)  Labs Review Labs Reviewed - No data to display  Imaging Review No results  found.   Visual Acuity Review  Right Eye Distance:   Left Eye Distance:   Bilateral Distance:    Right Eye Near:   Left Eye Near:    Bilateral Near:         MDM   1. Asthma exacerbation attacks, moderate persistent    Breo 1 inhalation daily F/U with PCP for additional refills as needed.  I advised the patient that her pulse oximetry level was low and that her lung sounds were not good. She had a significant amount of expiratory wheezing. She states that she feels fine and feels as though she is breathing fine. Although I had offered her a breathing treatment now she declined. She stated she did not think she needed one and really needed to leave to get her prescription filled before the pharmacy closed. The patient may return at any time for a nebulized treatment if needed. She is also advised to continue her albuterol at home along with her prednisone.   Hayden Rasmussen, NP 06/13/15 [phone]

## 2015-06-13 NOTE — Discharge Instructions (Signed)
Asthma Attack Prevention °While you may not be able to control the fact that you have asthma, you can take actions to prevent asthma attacks. The best way to prevent asthma attacks is to maintain good control of your asthma. You can achieve this by: °· Taking your medicines as directed. °· Avoiding things that can irritate your airways or make your asthma symptoms worse (asthma triggers). °· Keeping track of how well your asthma is controlled and of any changes in your symptoms. °· Responding quickly to worsening asthma symptoms (asthma attack). °· Seeking emergency care when it is needed. °WHAT ARE SOME WAYS TO PREVENT AN ASTHMA ATTACK? °Have a Plan °Work with your health care provider to create a written plan for managing and treating your asthma attacks (asthma action plan). This plan includes: °· A list of your asthma triggers and how you can avoid them. °· Information on when medicines should be taken and when their dosages should be changed. °· The use of a device that measures how well your lungs are working (peak flow meter). °Monitor Your Asthma °Use your peak flow meter and record your results in a journal every day. A drop in your peak flow numbers on one or more days may indicate the start of an asthma attack. This can happen even before you start to feel symptoms. You can prevent an asthma attack from getting worse by following the steps in your asthma action plan. °Avoid Asthma Triggers °Work with your asthma health care provider to find out what your asthma triggers are. This can be done by: °· Allergy testing. °· Keeping a journal that notes when asthma attacks occur and the factors that may have contributed to them. °· Determining if there are other medical conditions that are making your asthma worse. °Once you have determined your asthma triggers, take steps to avoid them. This may include avoiding excessive or prolonged exposure to: °· Dust. Have someone dust and vacuum your home for you once or  twice a week. Using a high-efficiency particulate arrestance (HEPA) vacuum is best. °· Smoke. This includes campfire smoke, forest fire smoke, and secondhand smoke from tobacco products. °· Pet dander. Avoid contact with animals that you know you are allergic to. °· Allergens from trees, grasses or pollens. Avoid spending a lot of time outdoors when pollen counts are high, and on very windy days. °· Very cold, dry, or humid air. °· Mold. °· Foods that contain high amounts of sulfites. °· Strong odors. °· Outdoor air pollutants, such as engine exhaust. °· Indoor air pollutants, such as aerosol sprays and fumes from household cleaners. °· Household pests, including dust mites and cockroaches, and pest droppings. °· Certain medicines, including NSAIDs. Always talk to your health care provider before stopping or starting any new medicines. °Medicines °Take over-the-counter and prescription medicines only as told by your health care provider. Many asthma attacks can be prevented by carefully following your medicine schedule. Taking your medicines correctly is especially important when you cannot avoid certain asthma triggers. °Act Quickly °If an asthma attack does happen, acting quickly can decrease how severe it is and how long it lasts. Take these steps:  °· Pay attention to your symptoms. If you are coughing, wheezing, or having difficulty breathing, do not wait to see if your symptoms go away on their own. Follow your asthma action plan. °· If you have followed your asthma action plan and your symptoms are not improving, call your health care provider or seek immediate medical care   at the nearest hospital. °It is important to note how often you need to use your fast-acting rescue inhaler. If you are using your rescue inhaler more often, it may mean that your asthma is not under control. Adjusting your asthma treatment plan may help you to prevent future asthma attacks and help you to gain better control of your  condition. °HOW CAN I PREVENT AN ASTHMA ATTACK WHEN I EXERCISE? °Follow advice from your health care provider about whether you should use your fast-acting inhaler before exercising. Many people with asthma experience exercise-induced bronchoconstriction (EIB). This condition often worsens during vigorous exercise in cold, humid, or dry environments. Usually, people with EIB can stay very active by pre-treating with a fast-acting inhaler before exercising. °  °This information is not intended to replace advice given to you by your health care provider. Make sure you discuss any questions you have with your health care provider. °  °Document Released: 05/15/2009 Document Revised: 02/15/2015 Document Reviewed: 10/27/2014 °Elsevier Interactive Patient Education ©2016 [employer] ° °Asthma, Acute Bronchospasm °Acute bronchospasm caused by asthma is also referred to as an asthma attack. Bronchospasm means your air passages become narrowed. The narrowing is caused by inflammation and tightening of the muscles in the air tubes (bronchi) in your lungs. This can make it hard to breathe or cause you to wheeze and cough. °CAUSES °Possible triggers are: °· Animal dander from the skin, hair, or feathers of animals. °· Dust mites contained in house dust. °· Cockroaches. °· Pollen from trees or grass. °· Mold. °· Cigarette or tobacco smoke. °· Air pollutants such as dust, household cleaners, hair sprays, aerosol sprays, paint fumes, strong chemicals, or strong odors. °· Cold air or weather changes. Cold air may trigger inflammation. Winds increase molds and pollens in the air. °· Strong emotions such as crying or laughing hard. °· Stress. °· Certain medicines such as aspirin or beta-blockers. °· Sulfites in foods and drinks, such as dried fruits and wine. °· Infections or inflammatory conditions, such as a flu, cold, or inflammation of the nasal membranes (rhinitis). °· Gastroesophageal reflux disease (GERD). GERD is a condition  where stomach acid backs up into your esophagus. °· Exercise or strenuous activity. °SIGNS AND SYMPTOMS  °· Wheezing. °· Excessive coughing, particularly at night. °· Chest tightness. °· Shortness of breath. °DIAGNOSIS  °Your health care provider will ask you about your medical history and perform a physical exam. A chest X-ray or blood testing may be performed to look for other causes of your symptoms or other conditions that may have triggered your asthma attack.  °TREATMENT  °Treatment is aimed at reducing inflammation and opening up the airways in your lungs.  Most asthma attacks are treated with inhaled medicines. These include quick relief or rescue medicines (such as bronchodilators) and controller medicines (such as inhaled corticosteroids). These medicines are sometimes given through an inhaler or a nebulizer. Systemic steroid medicine taken by mouth or given through an IV tube also can be used to reduce the inflammation when an attack is moderate or severe. Antibiotic medicines are only used if a bacterial infection is present.  °HOME CARE INSTRUCTIONS  °· Rest. °· Drink plenty of liquids. This helps the mucus to remain thin and be easily coughed up. Only use caffeine in moderation and do not use alcohol until you have recovered from your illness. °· Do not smoke. Avoid being exposed to secondhand smoke. °· You play a critical role in keeping yourself in good health. Avoid exposure to things that   cause you to wheeze or to have breathing problems. °· Keep your medicines up-to-date and available. Carefully follow your health care provider's treatment plan. °· Take your medicine exactly as prescribed. °· When pollen or pollution is bad, keep windows closed and use an air conditioner or go to places with air conditioning. °· Asthma requires careful medical care. See your health care provider for a follow-up as advised. If you are more than [redacted] weeks pregnant and you were prescribed any new medicines, let your  obstetrician know about the visit and how you are doing. Follow up with your health care provider as directed. °· After you have recovered from your asthma attack, make an appointment with your outpatient doctor to talk about ways to reduce the likelihood of future attacks. If you do not have a doctor who manages your asthma, make an appointment with a primary care doctor to discuss your asthma. °SEEK IMMEDIATE MEDICAL CARE IF:  °· You are getting worse. °· You have trouble breathing. If severe, call your local emergency services (911 in the [place] °· You develop chest pain or discomfort. °· You are vomiting. °· You are not able to keep fluids down. °· You are coughing up yellow, green, brown, or bloody sputum. °· You have a fever and your symptoms suddenly get worse. °· You have trouble swallowing. °MAKE SURE YOU:  °· Understand these instructions. °· Will watch your condition. °· Will get help right away if you are not doing well or get worse. °  °This information is not intended to replace advice given to you by your health care provider. Make sure you discuss any questions you have with your health care provider. °  °Document Released: 09/11/2006 Document Revised: 06/01/2013 Document Reviewed: 12/02/2012 °Elsevier Interactive Patient Education ©2016 Elsevier Inc. ° °

## 2015-06-14 ENCOUNTER — Other Ambulatory Visit: Payer: Self-pay

## 2015-06-14 DIAGNOSIS — IMO0002 Reserved for concepts with insufficient information to code with codable children: Secondary | ICD-10-CM

## 2015-06-14 MED ORDER — BUDESONIDE-FORMOTEROL FUMARATE 160-4.5 MCG/ACT IN AERO: 2.0000 | INHALATION_SPRAY | Freq: Two times a day (BID) | RESPIRATORY_TRACT | Status: DC

## 2015-06-14 MED ORDER — ALBUTEROL SULFATE HFA 108 (90 BASE) MCG/ACT IN AERS: 2.0000 | INHALATION_SPRAY | Freq: Four times a day (QID) | RESPIRATORY_TRACT | Status: DC | PRN

## 2015-06-20 ENCOUNTER — Other Ambulatory Visit: Payer: Self-pay | Admitting: *Deleted

## 2015-06-20 ENCOUNTER — Emergency Department (HOSPITAL_COMMUNITY): Payer: Self-pay

## 2015-06-20 ENCOUNTER — Encounter (HOSPITAL_COMMUNITY): Payer: Self-pay | Admitting: *Deleted

## 2015-06-20 ENCOUNTER — Observation Stay (HOSPITAL_COMMUNITY)
Admission: EM | Admit: 2015-06-20 | Discharge: 2015-06-21 | Disposition: A | Discharge status: Home / Self Care | Payer: Self-pay | Attending: Student in an Organized Health Care Education/Training Program | Admitting: Student in an Organized Health Care Education/Training Program

## 2015-06-20 DIAGNOSIS — Z87891 Personal history of nicotine dependence: Secondary | ICD-10-CM | POA: Insufficient documentation

## 2015-06-20 DIAGNOSIS — J4541 Moderate persistent asthma with (acute) exacerbation: Secondary | ICD-10-CM

## 2015-06-20 DIAGNOSIS — Z825 Family history of asthma and other chronic lower respiratory diseases: Secondary | ICD-10-CM | POA: Insufficient documentation

## 2015-06-20 DIAGNOSIS — IMO0002 Reserved for concepts with insufficient information to code with codable children: Secondary | ICD-10-CM

## 2015-06-20 DIAGNOSIS — J45901 Unspecified asthma with (acute) exacerbation: Principal | ICD-10-CM | POA: Diagnosis present

## 2015-06-20 DIAGNOSIS — D72829 Elevated white blood cell count, unspecified: Secondary | ICD-10-CM | POA: Insufficient documentation

## 2015-06-20 LAB — CBC WITH DIFFERENTIAL/PLATELET
Basophils Absolute: 0.1 10*3/uL (ref 0.0–0.1)
Basophils Relative: 1 %
EOS ABS: 1.4 10*3/uL — AB (ref 0.0–0.7)
EOS PCT: 11 %
HCT: 41.7 % (ref 36.0–46.0)
HEMOGLOBIN: 14 g/dL (ref 12.0–15.0)
LYMPHS ABS: 3.5 10*3/uL (ref 0.7–4.0)
Lymphocytes Relative: 28 %
MCH: 30.8 pg (ref 26.0–34.0)
MCHC: 33.6 g/dL (ref 30.0–36.0)
MCV: 91.9 fL (ref 78.0–100.0)
MONOS PCT: 6 %
Monocytes Absolute: 0.7 10*3/uL (ref 0.1–1.0)
NEUTROS PCT: 54 %
Neutro Abs: 6.9 10*3/uL (ref 1.7–7.7)
Platelets: 231 10*3/uL (ref 150–400)
RBC: 4.54 MIL/uL (ref 3.87–5.11)
RDW: 12.9 % (ref 11.5–15.5)
WBC: 12.6 10*3/uL — ABNORMAL HIGH (ref 4.0–10.5)

## 2015-06-20 LAB — BASIC METABOLIC PANEL
Anion gap: 10 (ref 5–15)
BUN: 12 mg/dL (ref 6–20)
CHLORIDE: 105 mmol/L (ref 101–111)
CO2: 23 mmol/L (ref 22–32)
CREATININE: 0.7 mg/dL (ref 0.44–1.00)
Calcium: 8.9 mg/dL (ref 8.9–10.3)
GFR calc Af Amer: >60 mL/min (ref >60)
GFR calc non Af Amer: >60 mL/min (ref >60)
Glucose, Bld: 104 mg/dL — ABNORMAL HIGH (ref 65–99)
Potassium: 3.9 mmol/L (ref 3.5–5.1)
Sodium: 138 mmol/L (ref 135–145)

## 2015-06-20 LAB — I-STAT BETA HCG BLOOD, ED (MC, WL, AP ONLY)

## 2015-06-20 MED ORDER — BUDESONIDE 0.25 MG/2ML IN SUSP
0.2500 mg | Freq: Two times a day (BID) | RESPIRATORY_TRACT | Status: DC
  Administered 2015-06-20 – 2015-06-21 (×3): 0.25 mg via RESPIRATORY_TRACT
  Filled 2015-06-20 (×3): qty 2

## 2015-06-20 MED ORDER — ALBUTEROL SULFATE (2.5 MG/3ML) 0.083% IN NEBU
5.0000 mg | INHALATION_SOLUTION | Freq: Once | RESPIRATORY_TRACT | Status: AC
  Administered 2015-06-20: 5 mg via RESPIRATORY_TRACT
  Filled 2015-06-20: qty 6

## 2015-06-20 MED ORDER — ALBUTEROL SULFATE HFA 108 (90 BASE) MCG/ACT IN AERS: 2.0000 | INHALATION_SPRAY | Freq: Four times a day (QID) | RESPIRATORY_TRACT | Status: DC | PRN

## 2015-06-20 MED ORDER — BUDESONIDE-FORMOTEROL FUMARATE 160-4.5 MCG/ACT IN AERO: 2.0000 | INHALATION_SPRAY | Freq: Two times a day (BID) | RESPIRATORY_TRACT | Status: DC

## 2015-06-20 MED ORDER — ACETAMINOPHEN 325 MG PO TABS
650.0000 mg | ORAL_TABLET | Freq: Four times a day (QID) | ORAL | Status: DC | PRN
  Administered 2015-06-20 – 2015-06-21 (×2): 650 mg via ORAL
  Filled 2015-06-20 (×2): qty 2

## 2015-06-20 MED ORDER — PREDNISONE 20 MG PO TABS
60.0000 mg | ORAL_TABLET | Freq: Once | ORAL | Status: AC
  Administered 2015-06-20: 60 mg via ORAL
  Filled 2015-06-20: qty 3

## 2015-06-20 MED ORDER — IPRATROPIUM BROMIDE 0.02 % IN SOLN
0.5000 mg | Freq: Once | RESPIRATORY_TRACT | Status: AC
  Administered 2015-06-20: 0.5 mg via RESPIRATORY_TRACT
  Filled 2015-06-20: qty 2.5

## 2015-06-20 MED ORDER — ALBUTEROL SULFATE HFA 108 (90 BASE) MCG/ACT IN AERS
2.0000 | INHALATION_SPRAY | Freq: Four times a day (QID) | RESPIRATORY_TRACT | Status: DC | PRN
Start: 2015-06-20 — End: 2015-07-17

## 2015-06-20 MED ORDER — PREDNISONE 20 MG PO TABS
40.0000 mg | ORAL_TABLET | Freq: Every day | ORAL | Status: DC
  Administered 2015-06-21: 40 mg via ORAL
  Filled 2015-06-20: qty 2

## 2015-06-20 MED ORDER — ENOXAPARIN SODIUM 40 MG/0.4ML ~~LOC~~ SOLN
40.0000 mg | SUBCUTANEOUS | Status: DC
  Administered 2015-06-20: 40 mg via SUBCUTANEOUS
  Filled 2015-06-20 (×2): qty 0.4

## 2015-06-20 MED ORDER — IPRATROPIUM-ALBUTEROL 0.5-2.5 (3) MG/3ML IN SOLN
3.0000 mL | Freq: Four times a day (QID) | RESPIRATORY_TRACT | Status: DC
  Administered 2015-06-20 – 2015-06-21 (×5): 3 mL via RESPIRATORY_TRACT
  Filled 2015-06-20 (×5): qty 3

## 2015-06-20 NOTE — H&P (Signed)
Date: 06/20/2015               Patient Name:  Catherine Porter MRN: [id number]  DOB: 05/10/1991 Age / Sex: 25 y.o., female   PCP: Dessa PhiJosalyn Funches, MD         Medical Service: Internal Medicine Teaching Service         Attending Physician: Dr. Tyson Aliasuncan Thomas Vincent, MD    First Contact: Dr. Earlene PlaterWallace Pager: [phone]  Second Contact: Dr. Beckie Saltsivet Pager: [phone]       After Hours (After 5p/  First Contact Pager: [phone]  weekends / holidays): Second Contact Pager: [phone]   Chief Complaint: Shorthness of breath  History of Present Illness: Pt is a 25 Y O F with no significant PMH except for asthma, who presented with complainst of SOB which  She noticed ~3 weeks ago, with initail SOB with exertion, but SOB got significantly worse yesterday such that she could not sleep. She used her inhalers- albuterol about every 10mins  without significant relief, she was previously on symbicort ran out and couldn't get refills [facility], instead was given breo which she says was not as effective. She has associated cough, minimally productive. She used to have chest tightness but no longer present.  She says she gets up to 20 exacerbations a year, requiring Ed visits, and is admitted 2-3 times a year, she has never been intubated. She says she satrted having these asthma like symptoms after the birth of er 2nd child in 2013. She has never had PFTS done, or seen a pulmonologist, so she has not been formally diagnosed with asthma. She says her symptoms are worse with change in weather and during winter, also when she is angry. She denies nasal congestion, myalgias, fevers or chills, denies sick contacts. She uses one pillow to sleep, denies leg swelling or redness. She very rarely smokes cigs ~ 3 cigs per year, her mother has a dog that sheds a lot and she knows this worsens her asthma, also the dust in her moms house. She also has a dog, but doesn't shed as much.   Meds: No current  facility-administered medications for this encounter.   Current Outpatient Prescriptions  Medication Sig Dispense Refill  . Fluticasone Furoate-Vilanterol 100-25 MCG/INH AEPB One inhalation daily (Patient taking differently: Inhale 1 puff into the lungs daily. One inhalation daily) 28 each 0  . albuterol (VENTOLIN HFA) 108 (90 Base) MCG/ACT inhaler Inhale 2 puffs into the lungs every 6 (six) hours as needed for wheezing or shortness of breath. 3 Inhaler 3  . budesonide-formoterol (SYMBICORT) 160-4.5 MCG/ACT inhaler Inhale 2 puffs into the lungs 2 (two) times daily. 3 Inhaler 3    Allergies: Allergies as of 06/20/2015 - Review Complete 06/20/2015  Allergen Reaction Noted  . Fish allergy Swelling 05/03/2012   Past Medical History  Diagnosis Date  . Asthma   . Migraine     "not often anymore" (02/22/2014)  . Arthritis     "fingers" (02/22/2014)  . Gestational HTN 2012    when pregnant with son    Past Surgical History  Procedure Laterality Date  . Cholecystectomy  2014  . Tonsillectomy  ~ 2008  . Tonsillectomy/adenoidectomy/turbinate reduction  ~ 2008   Family History  Problem Relation Age of Onset  . Anesthesia problems Neg Hx   . Asthma Neg Hx    Social History   Social History  . Marital Status: Married    Spouse Name: N/A  .  Number of Children: N/A  . Years of Education: N/A   Occupational History  . Not on file.   Social History Main Topics  . Smoking status: Former Smoker -- 4 years    Types: Cigarettes    Quit date: 10/06/2013  . Smokeless tobacco: Never Used  . Alcohol Use: No     Comment: 02/22/2014 "maybe once/month"  . Drug Use: No  . Sexual Activity: Yes    Birth Control/ Protection: IUD     Comment: IUD 2 years   Other Topics Concern  . Not on file   Social History Narrative    Review of Systems: CONSTITUTIONAL- No Fever, weightloss, night sweat or change in appetite, upper back ache bilat from coughing. SKIN- No Rash, colour changes or  itching. HEAD- No Headache or dizziness. Mouth/throat- No Sorethroat, dentures, or bleeding gums. CARDIAC- No Palpitations, or chest pain. GI- No  vomiting, diarrhoea,  abd pain. URINARY- No Frequency,  or dysuria. NEUROLOGIC- No Numbness, syncope.  Physical Exam: Blood pressure 133/92, pulse 103, temperature 98.7 F (37.1 C), temperature source Oral, resp. rate 24, last menstrual period 05/29/2015, SpO2 98 %. GENERAL- alert, co-operative, appears as stated age, not in any distress. HEENT- Atraumatic, normocephalic, PERRL,  oral mucosa appears moist,  no cervical LN enlargement, neck supple. CARDIAC- RRR, no murmurs, rubs or gallops. RESP- Moving equal volumes of air, and diffuse coarse expiratory and inspiratory wheezes , no crackles. ABDOMEN- Soft, obese, nontender, with striae, bowel sounds present. BACK- Normal curvature of the spine, No tenderness along the vertebrae, no CVA tenderness. NEURO- No obvious Cr N abnormality, moving all extremities voluntarily. EXTREMITIES- pulse 2+, symmetric, no pedal edema. SKIN- Warm, dry, No rash or lesion. PSYCH- Normal mood and affect, appropriate thought content and speech.  Lab results: Basic Metabolic Panel:  Recent Labs  16/10/96 0635  NA 138  K 3.9  CL 105  CO2 23  GLUCOSE 104*  BUN 12  CREATININE 0.70  CALCIUM 8.9   CBC:  Recent Labs  06/20/15 0635  WBC 12.6*  NEUTROABS 6.9  HGB 14.0  HCT 41.7  MCV 91.9  PLT 231   Urine Drug Screen: Drugs of Abuse     Component Value Date/Time   LABOPIA NONE DETECTED 10/06/2013 1557   COCAINSCRNUR NONE DETECTED 10/06/2013 1557   LABBENZ NONE DETECTED 10/06/2013 1557   AMPHETMU NONE DETECTED 10/06/2013 1557   THCU NONE DETECTED 10/06/2013 1557   LABBARB NONE DETECTED 10/06/2013 1557    Imaging results:  Dg Chest 2 View  06/20/2015  CLINICAL DATA:  25 year old female with shortness of breath and cough EXAM: CHEST  2 VIEW COMPARISON:  Chest radiograph dated 06/11/2015 FINDINGS:  The heart size and mediastinal contours are within normal limits. Both lungs are clear. The visualized skeletal structures are unremarkable. IMPRESSION: No active cardiopulmonary disease. Electronically Signed   By: Elgie Collard M.D.   On: 06/20/2015 05:59    Other results: EKG: None  Assessment & Plan by Problem: Active Problems:   Asthma exacerbation  Shortness of breath- Differentials- Adult onset asthma, Vocal Cord dysfunction- considering frequent episodes yearly despite albuterol and LABA and steroids, also can be exacerbated by emotion, but today pt has both insp and exp wheeze, without typical chest tightness. Also Asthma likely uncontrolled, due to freq exposure to allergens- dog and dust.Doubt allergic bronchopulmonary aspergillosis. - Admit to med surg - Duonebs scheduled Q6H - Budesonide- nebs Q12H - prednisone 40mg  daily - Follow Peak flows - Would benefit from outpt  referral to a Pulmonologist ,for formal diagnosis - May benefit from a leukotriene receptor antag, as outpt, after confirming adherence and proper technique  Leukocytosis- WBC- 12.6, monitor for infection, likely stress reaction at this time.   Dispo: Disposition is deferred at this time, awaiting improvement of current medical problems.   The patient does have a current PCP (Dessa Phi, MD) and does need an [facility] hospital follow-up appointment after discharge.  The patient does not know have transportation limitations that hinder transportation to clinic appointments.  Signed: Onnie Boer, MD 06/20/2015, 10:30 AM

## 2015-06-20 NOTE — ED Provider Notes (Signed)
CSN: [id number]     Arrival date & time 06/20/15  0503 History   First MD Initiated Contact with Patient 06/20/15 [phone]     Chief Complaint  Patient presents with  . Asthma     (Consider location/radiation/quality/duration/timing/severity/associated sxs/prior Treatment) Patient is a 25 y.o. female presenting with asthma. The history is provided by the patient.  Asthma  She started having wheezing and difficulty breathing yesterday. Symptoms are associated with cough which is nonproductive. She denies fever, chills, sweats. She denies nausea or vomiting. She has some mild aching in her upper back related to coughing. There have been no other arthralgias or myalgias. She does not recall if she had an influenza immunization this year.  Past Medical History  Diagnosis Date  . Asthma   . Migraine     "not often anymore" (02/22/2014)  . Arthritis     "fingers" (02/22/2014)  . Gestational HTN 2012    when pregnant with son    Past Surgical History  Procedure Laterality Date  . Cholecystectomy  2014  . Tonsillectomy  ~ 2008  . Tonsillectomy/adenoidectomy/turbinate reduction  ~ 2008   Family History  Problem Relation Age of Onset  . Anesthesia problems Neg Hx   . Asthma Neg Hx    Social History  Substance Use Topics  . Smoking status: Former Smoker -- 4 years    Types: Cigarettes    Quit date: 10/06/2013  . Smokeless tobacco: Never Used  . Alcohol Use: No     Comment: 02/22/2014 "maybe once/month"   OB History    Gravida Para Term Preterm AB TAB SAB Ectopic Multiple Living   2 2 2  0 0 0 0 0 0 2      Obstetric Comments   2 children, one boy and girl     Review of Systems  All other systems reviewed and are negative.     Allergies  Fish allergy  Home Medications   Prior to Admission medications   Medication Sig Start Date End Date Taking? Authorizing Provider  albuterol (VENTOLIN HFA) 108 (90 Base) MCG/ACT inhaler Inhale 2 puffs into the lungs every 6 (six) hours as  needed for wheezing or shortness of breath. 06/14/15   Josalyn Funches, MD  budesonide-formoterol (SYMBICORT) 160-4.5 MCG/ACT inhaler Inhale 2 puffs into the lungs 2 (two) times daily. 06/14/15   Dessa PhiJosalyn Funches, MD  Fluticasone Furoate-Vilanterol 100-25 MCG/INH AEPB One inhalation daily 06/13/15   Hayden Rasmussenavid Mabe, NP  predniSONE (DELTASONE) 20 MG tablet Take 2 tablets (40 mg total) by mouth once. 06/12/15   Marily MemosJason Mesner, MD   BP 125/94 mmHg  Pulse 94  Temp(Src) 98.7 F (37.1 C) (Oral)  Resp 20  SpO2 100%  LMP 05/29/2015 Physical Exam  Nursing note and vitals reviewed.  25 year old female, resting comfortably and in no acute distress. Vital signs are significant for mild hypertension. Oxygen saturation is 100%, which is normal. Head is normocephalic and atraumatic. PERRLA, EOMI. Oropharynx is clear. Neck is nontender and supple without adenopathy or JVD. Back is nontender and there is no CVA tenderness. Lungs have diffuse inspiratory and expiratory wheezes without rales or rhonchi. Air flow is fairly good. Chest is nontender. Heart has regular rate and rhythm without murmur. Abdomen is soft, flat, nontender without masses or hepatosplenomegaly and peristalsis is normoactive. Extremities have no cyanosis or edema, full range of motion is present. Skin is warm and dry without rash. Neurologic: Mental status is normal, cranial nerves are intact, there are no motor  or sensory deficits.  ED Course  Procedures (including critical care time) Laboratory Review Results for orders placed or performed during the hospital encounter of 06/20/15  Basic metabolic panel  Result Value Ref Range   Sodium 138 135 - 145 mmol/L   Potassium 3.9 3.5 - 5.1 mmol/L   Chloride 105 101 - 111 mmol/L   CO2 23 22 - 32 mmol/L   Glucose, Bld 104 (H) 65 - 99 mg/dL   BUN 12 6 - 20 mg/dL   Creatinine, Ser 1.61 0.44 - 1.00 mg/dL   Calcium 8.9 8.9 - 09.6 mg/dL   GFR calc non Af Amer >60 >60 mL/min   GFR calc Af Amer >60 >60  mL/min   Anion gap 10 5 - 15  CBC with Differential  Result Value Ref Range   WBC 12.6 (H) 4.0 - 10.5 K/uL   RBC 4.54 3.87 - 5.11 MIL/uL   Hemoglobin 14.0 12.0 - 15.0 g/dL   HCT 04.5 40.9 - 81.1 %   MCV 91.9 78.0 - 100.0 fL   MCH 30.8 26.0 - 34.0 pg   MCHC 33.6 30.0 - 36.0 g/dL   RDW 91.4 78.2 - 95.6 %   Platelets 231 150 - 400 K/uL   Neutrophils Relative % 54 %   Neutro Abs 6.9 1.7 - 7.7 K/uL   Lymphocytes Relative 28 %   Lymphs Abs 3.5 0.7 - 4.0 K/uL   Monocytes Relative 6 %   Monocytes Absolute 0.7 0.1 - 1.0 K/uL   Eosinophils Relative 11 %   Eosinophils Absolute 1.4 (H) 0.0 - 0.7 K/uL   Basophils Relative 1 %   Basophils Absolute 0.1 0.0 - 0.1 K/uL  I-Stat beta hCG blood, ED  Result Value Ref Range   I-stat hCG, quantitative <5.0 <5 mIU/mL   Comment 3           Imaging Review Dg Chest 2 View  06/20/2015  CLINICAL DATA:  25 year old female with shortness of breath and cough EXAM: CHEST  2 VIEW COMPARISON:  Chest radiograph dated 06/11/2015 FINDINGS: The heart size and mediastinal contours are within normal limits. Both lungs are clear. The visualized skeletal structures are unremarkable. IMPRESSION: No active cardiopulmonary disease. Electronically Signed   By: Elgie Collard M.D.   On: 06/20/2015 05:59   I have personally reviewed and evaluated these images as part of my medical decision-making.  MDM   Final diagnoses:  Asthma exacerbation    Asthma exacerbation. Old records reviewed in she had been on Symbicort and there is a delay in getting the prescription refilled in she been out of it for several weeks. One week ago, Virgel Bouquet was substituted for Symbicort. She has had occasional ED visits for asthma exacerbation. Last hospitalization was last March. She is given albuterol via nebulizer and is given a dose of prednisone. Chest x-ray is obtained to make sure there is not an underlying pneumonia.  Chest x-ray shows no evidence of pneumonia. Following nebulizer  treatment, patient noted little change and exam was unchanged. She was given a second nebulizer treatment with no additional improvement, and given a third albuterol with ipratropium nebulizer treatment without improvement. At this point, decision was made to admit the patient. Case is discussed with Dr. Mariea Clonts of internal medicine teaching service who agrees to admit the patient.  Dione Booze, MD 06/20/15 [phone]

## 2015-06-20 NOTE — Progress Notes (Signed)
Spoke to patient regarding primary care resources and [facility]. Patient [facility].Patient was instructed to follow up with her pcp once discharged. Orange card application provided and explained. No other Community [facility] Eligibility Specialist needs identified at this time.  Buddy DutyFelicia Porter [facility] [phone]

## 2015-06-20 NOTE — ED Notes (Signed)
Pt to ED c/o asthma exacerbation. Has been seen two other times for similar episodes. Reports using inhaler at home and [facility]. Pt c/o back pain and tightness. Pt speaking in short sentences. Wheezes noted bilaterally and diminished in lower lobes

## 2015-06-21 DIAGNOSIS — J45901 Unspecified asthma with (acute) exacerbation: Secondary | ICD-10-CM

## 2015-06-21 LAB — CBC WITH DIFFERENTIAL/PLATELET
BASOS PCT: 1 %
Basophils Absolute: 0.1 10*3/uL (ref 0.0–0.1)
EOS PCT: 8 %
Eosinophils Absolute: 0.9 10*3/uL — ABNORMAL HIGH (ref 0.0–0.7)
HEMATOCRIT: 41.6 % (ref 36.0–46.0)
Hemoglobin: 14.1 g/dL (ref 12.0–15.0)
LYMPHS PCT: 38 %
Lymphs Abs: 4.2 10*3/uL — ABNORMAL HIGH (ref 0.7–4.0)
MCH: 31.1 pg (ref 26.0–34.0)
MCHC: 33.9 g/dL (ref 30.0–36.0)
MCV: 91.6 fL (ref 78.0–100.0)
MONO ABS: 0.6 10*3/uL (ref 0.1–1.0)
MONOS PCT: 6 %
NEUTROS ABS: 5.3 10*3/uL (ref 1.7–7.7)
Neutrophils Relative %: 47 %
PLATELETS: 242 10*3/uL (ref 150–400)
RBC: 4.54 MIL/uL (ref 3.87–5.11)
RDW: 12.9 % (ref 11.5–15.5)
WBC: 11.2 10*3/uL — ABNORMAL HIGH (ref 4.0–10.5)

## 2015-06-21 MED ORDER — PREDNISONE 50 MG PO TABS: 60.0000 mg | ORAL_TABLET | Freq: Every day | ORAL | Status: DC

## 2015-06-21 MED ORDER — PREDNISONE 20 MG PO TABS: ORAL_TABLET | ORAL | Status: DC

## 2015-06-21 MED FILL — predniSONE 20 MG TABS: 20 | 12 days supply | Qty: 24 | Fill #0

## 2015-06-21 NOTE — Discharge Summary (Signed)
Name: Catherine Porter O Abdulaziz MRN: [id number] DOB: 03/17/1991 25 y.o. PCP: Dessa PhiJosalyn Funches, MD  Date of Admission: 06/20/2015  5:06 AM Date of Discharge: 06/21/2015 Attending Physician: No att. providers found  Discharge Diagnosis:  Active Problems:   Asthma exacerbation  Discharge Medications:   Medication List    STOP taking these medications        budesonide-formoterol 160-4.5 MCG/ACT inhaler  Commonly known as:  SYMBICORT      TAKE these medications        albuterol 108 (90 Base) MCG/ACT inhaler  Commonly known as:  VENTOLIN HFA  Inhale 2 puffs into the lungs every 6 (six) hours as needed for wheezing or shortness of breath.     Fluticasone Furoate-Vilanterol 100-25 MCG/INH Aepb  One inhalation daily     predniSONE 20 MG tablet  Commonly known as:  DELTASONE  Please follow the following taper: 60mg  x 4 days, then 40mg  x 4 days, then 20mg  x 4 days, then off.  Start taking on:  06/22/2015        Disposition and follow-up:   [facility].Catherine Porter [facility].    1.  At the hospital follow up visit please address:  - patients need for PFTs and pulmonology referral for formal evaluation of asthma and assistance in reducing frequency of exacerbations  2.  Labs / imaging needed at time of follow-up: PFTs  3.  Pending labs/ test needing follow-up: none  Follow-up Appointments: Follow-up Information    Follow [facility] with [facility] On 07/07/2015.   Why:  3:15 for hospital follow up    Contact information:   [address] [facility] [place] [id number] [phone]      Discharge Instructions: Discharge Instructions    Diet - low sodium heart healthy    Complete by:  As directed      Increase activity slowly    Complete by:  As directed            Consultations:    Procedures Performed:  Dg Chest 2 View  06/20/2015  CLINICAL DATA:  25 year old female with  shortness of breath and cough EXAM: CHEST  2 VIEW COMPARISON:  Chest radiograph dated 06/11/2015 FINDINGS: The heart size and mediastinal contours are within normal limits. Both lungs are clear. The visualized skeletal structures are unremarkable. IMPRESSION: No active cardiopulmonary disease. Electronically Signed   By: Elgie CollardArash  Radparvar M.D.   On: 06/20/2015 05:59   Dg Chest 2 View  06/11/2015  CLINICAL DATA:  Chest pain and cough EXAM: CHEST  2 VIEW COMPARISON:  None. FINDINGS: The heart size and mediastinal contours are within normal limits. Both lungs are clear. The visualized skeletal structures are unremarkable. IMPRESSION: No active cardiopulmonary disease. Electronically Signed   By: Jolaine ClickArthur  Hoss M.D.   On: 06/11/2015 14:31    2D Echo: none  Cardiac Cath: none  Admission HPI:  Pt is a 25 Y O F with no significant PMH except for asthma, who presented with complainst of SOB which She noticed ~3 weeks ago, with initail SOB with exertion, but SOB got significantly worse yesterday such that she could not sleep. She used her inhalers- albuterol about every 10mins without significant relief, she was previously on symbicort ran out and couldn't [facility] refills [facility], instead was given breo which she says was not as effective. She has associated cough, minimally productive. She used to have chest tightness  but no longer present.  She says she gets up to 20 exacerbations a year, requiring Ed visits, and is admitted 2-3 times a year, she has never been intubated. She says she satrted having these asthma like symptoms after the birth of er 2nd child in 2013. She has never had PFTS done, or seen a pulmonologist, so she has not been formally diagnosed with asthma. She says her symptoms are worse with change in weather and during winter, also when she is angry. She denies nasal congestion, myalgias, fevers or chills, denies sick contacts. She uses one pillow to sleep, denies leg swelling or  redness. She very rarely smokes cigs ~ 24 cigs per year, her mother has a dog that sheds a lot and she knows this worsens her asthma, also the dust in her moms house. She also has a dog, but doesn't shed as much.   Hospital Course by problem list: Active Problems:   Asthma exacerbation   Asthma Exacerbation:  Patient with frequent asthma exacerbations was treated with frequent breathing treatments while awake and systemic steroids.  She responded well to this and vitals remained stable, oxygenating well on room air.  Patient ambulated with nursing on day of discharge, maintaining oxygen saturation while doing so on room air.  She will be discharged on prednisone 60mg  daily for a two week taper.  Will continue home ICS/LABA scheduled and albuterol for rescue.  Outpatient PFTs and pulmonology follow up recommeded.  Discharge Vitals:   BP 151/98 mmHg  Pulse 119  Temp(Src) 98.5 F (36.9 C) (Oral)  Resp 22  Ht 5\' 6"  (1.676 m)  Wt 210 lb 15.7 oz (95.7 kg)  BMI 34.07 kg/m2  SpO2 96%  LMP 05/29/2015  Discharge Labs:  Results for orders placed or performed during the hospital encounter of 06/20/15 (from the past 24 hour(s))  CBC with Differential/Platelet     Status: Abnormal   Collection Time: 06/21/15  8:24 AM  Result Value Ref Range   WBC 11.2 (H) 4.0 - 10.5 K/uL   RBC 4.54 3.87 - 5.11 MIL/uL   Hemoglobin 14.1 12.0 - 15.0 g/dL   HCT 16.1 09.6 - 04.5 %   MCV 91.6 78.0 - 100.0 fL   MCH 31.1 26.0 - 34.0 pg   MCHC 33.9 30.0 - 36.0 g/dL   RDW 40.9 81.1 - 91.4 %   Platelets 242 150 - 400 K/uL   Neutrophils Relative % 47 %   Neutro Abs 5.3 1.7 - 7.7 K/uL   Lymphocytes Relative 38 %   Lymphs Abs 4.2 (H) 0.7 - 4.0 K/uL   Monocytes Relative 6 %   Monocytes Absolute 0.6 0.1 - 1.0 K/uL   Eosinophils Relative 8 %   Eosinophils Absolute 0.9 (H) 0.0 - 0.7 K/uL   Basophils Relative 1 %   Basophils Absolute 0.1 0.0 - 0.1 K/uL    Signed: Gwynn Burly, DO 06/21/2015, 8:25 PM    Services  Ordered on Discharge: none Equipment Ordered on Discharge: none

## 2015-06-21 NOTE — Progress Notes (Signed)
Subjective: Patient seen and examined this morning on rounds.  No acute events overnight.  She reports still having some wheezing but is overall better.  She is agreeable to ambulating today with nursing.  Objective: Vital signs in last 24 hours: Filed Vitals:   06/20/15 2134 06/21/15 0140 06/21/15 0530 06/21/15 0837  BP: 123/68  115/67   Pulse: 97  70   Temp: 98.6 F (37 C)  98.2 F (36.8 C)   TempSrc: Oral  Oral   Resp: 20  18   Height:      Weight:      SpO2: 95% 96% 94% 98%   Weight change:   Intake/Output Summary (Last 24 hours) at 06/21/15 1019 Last data filed at 06/21/15 0916  Gross per 24 hour  Intake    400 ml  Output    500 ml  Net   -100 ml   General: sitting up in bed, no distress HEENT: EOMI, no scleral icterus Cardiac: RRR, no rubs, murmurs or gallops Pulm: no increased work of breathing, inspiratory and expiratory wheezes present Abd: soft, nontender, nondistended, BS present Ext: warm and well perfused, no pedal edema Neuro: alert and oriented X3, cranial nerves II-XII grossly intact  Lab Results: Basic Metabolic Panel:  Recent Labs Lab 06/20/15 0635  NA 138  K 3.9  CL 105  CO2 23  GLUCOSE 104*  BUN 12  CREATININE 0.70  CALCIUM 8.9   Liver Function Tests: No results for input(s): AST, ALT, ALKPHOS, BILITOT, PROT, ALBUMIN in the last 168 hours. No results for input(s): LIPASE, AMYLASE in the last 168 hours. No results for input(s): AMMONIA in the last 168 hours. CBC:  Recent Labs Lab 06/20/15 0635 06/21/15 0824  WBC 12.6* 11.2*  NEUTROABS 6.9 5.3  HGB 14.0 14.1  HCT 41.7 41.6  MCV 91.9 91.6  PLT 231 242   Cardiac Enzymes: No results for input(s): CKTOTAL, CKMB, CKMBINDEX, TROPONINI in the last 168 hours. BNP: No results for input(s): PROBNP in the last 168 hours. D-Dimer: No results for input(s): DDIMER in the last 168 hours. CBG: No results for input(s): GLUCAP in the last 168 hours. Hemoglobin A1C: No results for  input(s): HGBA1C in the last 168 hours. Fasting Lipid Panel: No results for input(s): CHOL, HDL, LDLCALC, TRIG, CHOLHDL, LDLDIRECT in the last 168 hours. Thyroid Function Tests: No results for input(s): TSH, T4TOTAL, FREET4, T3FREE, THYROIDAB in the last 168 hours. Coagulation: No results for input(s): LABPROT, INR in the last 168 hours. Anemia Panel: No results for input(s): VITAMINB12, FOLATE, FERRITIN, TIBC, IRON, RETICCTPCT in the last 168 hours. Urine Drug Screen: Drugs of Abuse     Component Value Date/Time   LABOPIA NONE DETECTED 10/06/2013 1557   COCAINSCRNUR NONE DETECTED 10/06/2013 1557   LABBENZ NONE DETECTED 10/06/2013 1557   AMPHETMU NONE DETECTED 10/06/2013 1557   THCU NONE DETECTED 10/06/2013 1557   LABBARB NONE DETECTED 10/06/2013 1557    Alcohol Level: No results for input(s): ETH in the last 168 hours. Urinalysis: No results for input(s): COLORURINE, LABSPEC, PHURINE, GLUCOSEU, HGBUR, BILIRUBINUR, KETONESUR, PROTEINUR, UROBILINOGEN, NITRITE, LEUKOCYTESUR in the last 168 hours.  Invalid input(s): APPERANCEUR   Micro Results: No results found for this or any previous visit (from the past 240 hour(s)). Studies/Results: Dg Chest 2 View  06/20/2015  CLINICAL DATA:  25 year old female with shortness of breath and cough EXAM: CHEST  2 VIEW COMPARISON:  Chest radiograph dated 06/11/2015 FINDINGS: The heart size and mediastinal contours are within normal  limits. Both lungs are clear. The visualized skeletal structures are unremarkable. IMPRESSION: No active cardiopulmonary disease. Electronically Signed   By: Elgie Collard M.D.   On: 06/20/2015 05:59   Medications:  Scheduled Meds: . budesonide (PULMICORT) nebulizer solution  0.25 mg Nebulization BID  . enoxaparin (LOVENOX) injection  40 mg Subcutaneous Q24H  . ipratropium-albuterol  3 mL Nebulization Q6H  . [START ON 06/22/2015] predniSONE  60 mg Oral Q breakfast   Continuous Infusions:  PRN  Meds:.acetaminophen Assessment/Plan: Active Problems:   Asthma exacerbation  Shortness of Breath: likely asthma exacerbation although patient has never been formally evaluated with PFTs or by seeing pulmonology.  Overall, she feels better on the treatments provided since admission (steroids, breathing treatments).  We will ambulate her today while monitoring oxygen saturations, recommend outpatient PFTs and pulmonology follow up for better asthma control. - continue Duonebs - continue Budesonide nebs - increase prednisone to 60 mg daily.  Discharge on 2 week taper - outpatient PFTs  Dispo: plan for discharge home today  The patient does have a current PCP (Dessa Phi, MD) and does not need an [facility] hospital follow-up appointment after discharge.  The patient does not have transportation limitations that hinder transportation to clinic appointments.  .Services Needed at time of discharge: Y = Yes, Blank = No PT:   OT:   RN:   Equipment:   Other:       Gwynn Burly, DO 06/21/2015, 10:19 AM

## 2015-06-21 NOTE — Care Management Note (Signed)
Case Management Note  Patient Details  Name: Catherine Porter MRN: [id number] Date of Birth: 09/25/1990  Subjective/Objective:    Date: 06/21/15 Spoke with patient at the bedside.  Introduced self as Sports coachcase manager and explained role in discharge planning and how to be reached.  Verified patient lives in town, with spouse, and kids and her mom. Expressed potential need for no other DME.  Verified patient anticipates to go home with family, at time of discharge and will have full-time supervision by family at this time to best of their knowledge. Patient denied needing help with her medication, she will be going to the [facility] to get her medication.  Patient drives to MD appointments.  [facility]ified [facility]  Patient is being check for ambulatory saturations for possible home oxygen.     Plan: CM will continue to follow for discharge planning and [facility].                 Action/Plan:   Expected Discharge Date:                  Expected Discharge Plan:  Home/Self Care  In-House Referral:     Discharge planning Services  CM Consult, Follow-up appt scheduled, Indigent Health Clinic, Medication Assistance  Post Acute Care Choice:    Choice offered to:     DME Arranged:    DME Agency:     HH Arranged:    HH Agency:     Status of Service:  Completed, signed off  Medicare Important Message Given:    Date Medicare IM Given:    Medicare IM give by:    Date Additional Medicare IM Given:    Additional Medicare Important Message give by:     If discussed at Long Length of Stay Meetings, dates discussed:    Additional Comments:  Leone Havenaylor, Lynette Topete Clinton, RN 06/21/2015, 12:11 PM

## 2015-06-21 NOTE — Progress Notes (Signed)
NURSING PROGRESS NOTE  Catherine CurtDaynez O Ranta [id number] Discharge Data: 06/21/2015 4:26 PM Attending Provider: Tyson Aliasuncan Thomas Vincent, MD [id number]:FUNCHES, Ellison CarwinJOSALYN C, MD     Catherine Porter to be D/C'd Home per MD order.  Discussed with the patient the After Visit Summary and all questions fully answered. All IV's discontinued with no bleeding noted. All belongings returned to patient for patient to take home.   Last Vital Signs:  Blood pressure 151/98, pulse 119, temperature 98.5 F (36.9 C), temperature source Oral, resp. rate 22, height 5\' 6"  (1.676 m), weight 95.7 kg (210 lb 15.7 oz), last menstrual period 05/29/2015, SpO2 96 %.  Discharge Medication List   Medication List    STOP taking these medications        budesonide-formoterol 160-4.5 MCG/ACT inhaler  Commonly known as:  SYMBICORT      TAKE these medications        albuterol 108 (90 Base) MCG/ACT inhaler  Commonly known as:  VENTOLIN HFA  Inhale 2 puffs into the lungs every 6 (six) hours as needed for wheezing or shortness of breath.     Fluticasone Furoate-Vilanterol 100-25 MCG/INH Aepb  One inhalation daily     predniSONE 20 MG tablet  Commonly known as:  DELTASONE  Please follow the following taper: 60mg  x 4 days, then 40mg  x 4 days, then 20mg  x 4 days, then off.  Start taking on:  06/22/2015         Roma KayserStephanie Bingham Millette, RN

## 2015-06-21 NOTE — Discharge Instructions (Signed)
Dear Ms. Thedore MinsOrdonez,  It was a pleasure taking care of you while you were in the hospital.  We have sent a new prescription to your pharmacy for a prednisone taper for 2 weeks.  Please follow the directions on the prescription.    Please follow up with your primary care doctor.  An appointment has been scheduled for you already.  Please ask about further investigation of your asthma diagnosis with your primary care doctor.  You may benefit from pulmonary function tests or seeing a lung doctor specialist.  I have continued your Breo and discontinued your Symbicort based on our conversation this morning about what you currently have access to.  Please discuss with primary doctor about which to continue long-term.  For now, continue taking your Breo.  Take care, Dr. Earlene PlaterWallace

## 2015-06-21 NOTE — Progress Notes (Signed)
Patient Saturations on Room Air at Rest = 97%  Patient Saturations on [employer] Air while Ambulating = 96-97%

## 2015-07-04 ENCOUNTER — Encounter (HOSPITAL_COMMUNITY): Payer: Self-pay | Admitting: Emergency Medicine

## 2015-07-04 ENCOUNTER — Emergency Department (HOSPITAL_COMMUNITY): Payer: Self-pay

## 2015-07-04 ENCOUNTER — Emergency Department (HOSPITAL_COMMUNITY)
Admission: EM | Admit: 2015-07-04 | Discharge: 2015-07-04 | Disposition: A | Discharge status: Home / Self Care | Payer: Self-pay | Attending: Emergency Medicine | Admitting: Emergency Medicine

## 2015-07-04 DIAGNOSIS — R112 Nausea with vomiting, unspecified: Secondary | ICD-10-CM | POA: Insufficient documentation

## 2015-07-04 DIAGNOSIS — N83209 Unspecified ovarian cyst, unspecified side: Secondary | ICD-10-CM

## 2015-07-04 DIAGNOSIS — N83202 Unspecified ovarian cyst, left side: Secondary | ICD-10-CM | POA: Insufficient documentation

## 2015-07-04 DIAGNOSIS — Z79899 Other long term (current) drug therapy: Secondary | ICD-10-CM | POA: Insufficient documentation

## 2015-07-04 DIAGNOSIS — Z8679 Personal history of other diseases of the circulatory system: Secondary | ICD-10-CM | POA: Insufficient documentation

## 2015-07-04 DIAGNOSIS — Z87891 Personal history of nicotine dependence: Secondary | ICD-10-CM | POA: Insufficient documentation

## 2015-07-04 DIAGNOSIS — Z3202 Encounter for pregnancy test, result negative: Secondary | ICD-10-CM | POA: Insufficient documentation

## 2015-07-04 DIAGNOSIS — J45909 Unspecified asthma, uncomplicated: Secondary | ICD-10-CM | POA: Insufficient documentation

## 2015-07-04 DIAGNOSIS — Z7951 Long term (current) use of inhaled steroids: Secondary | ICD-10-CM | POA: Insufficient documentation

## 2015-07-04 DIAGNOSIS — M1990 Unspecified osteoarthritis, unspecified site: Secondary | ICD-10-CM | POA: Insufficient documentation

## 2015-07-04 DIAGNOSIS — R102 Pelvic and perineal pain: Secondary | ICD-10-CM

## 2015-07-04 LAB — COMPREHENSIVE METABOLIC PANEL
ALK PHOS: 64 U/L (ref 38–126)
ALT: 25 U/L (ref 14–54)
AST: 17 U/L (ref 15–41)
Albumin: 3.6 g/dL (ref 3.5–5.0)
Anion gap: 11 (ref 5–15)
BILIRUBIN TOTAL: 0.5 mg/dL (ref 0.3–1.2)
BUN: 10 mg/dL (ref 6–20)
CALCIUM: 9.2 mg/dL (ref 8.9–10.3)
CO2: 25 mmol/L (ref 22–32)
CREATININE: 0.66 mg/dL (ref 0.44–1.00)
Chloride: 103 mmol/L (ref 101–111)
Glucose, Bld: 118 mg/dL — ABNORMAL HIGH (ref 65–99)
Potassium: 4.2 mmol/L (ref 3.5–5.1)
Sodium: 139 mmol/L (ref 135–145)
Total Protein: 6.3 g/dL — ABNORMAL LOW (ref 6.5–8.1)

## 2015-07-04 LAB — CBC
HCT: 42.5 % (ref 36.0–46.0)
Hemoglobin: 14.2 g/dL (ref 12.0–15.0)
MCH: 30.7 pg (ref 26.0–34.0)
MCHC: 33.4 g/dL (ref 30.0–36.0)
MCV: 91.8 fL (ref 78.0–100.0)
PLATELETS: 257 10*3/uL (ref 150–400)
RBC: 4.63 MIL/uL (ref 3.87–5.11)
RDW: 13 % (ref 11.5–15.5)
WBC: 13.2 10*3/uL — AB (ref 4.0–10.5)

## 2015-07-04 LAB — GC/CHLAMYDIA PROBE AMP (~~LOC~~) NOT AT ARMC
Chlamydia: NEGATIVE
NEISSERIA GONORRHEA: NEGATIVE

## 2015-07-04 LAB — WET PREP, GENITAL
Clue Cells Wet Prep HPF POC: NONE SEEN
SPERM: NONE SEEN
Trich, Wet Prep: NONE SEEN
YEAST WET PREP: NONE SEEN

## 2015-07-04 LAB — URINALYSIS, ROUTINE W REFLEX MICROSCOPIC
Bilirubin Urine: NEGATIVE
Glucose, UA: NEGATIVE mg/dL
HGB URINE DIPSTICK: NEGATIVE
KETONES UR: NEGATIVE mg/dL
LEUKOCYTES UA: NEGATIVE
Nitrite: NEGATIVE
PROTEIN: NEGATIVE mg/dL
Specific Gravity, Urine: 1.023 (ref 1.005–1.030)
pH: 6 (ref 5.0–8.0)

## 2015-07-04 LAB — LIPASE, BLOOD: LIPASE: 30 U/L (ref 11–51)

## 2015-07-04 LAB — POC URINE PREG, ED: PREG TEST UR: NEGATIVE

## 2015-07-04 MED ORDER — IOHEXOL 300 MG/ML  SOLN
100.0000 mL | Freq: Once | INTRAMUSCULAR | Status: AC | PRN
  Administered 2015-07-04: 100 mL via INTRAVENOUS

## 2015-07-04 MED ORDER — NAPROXEN 500 MG PO TABS: 500.0000 mg | ORAL_TABLET | Freq: Two times a day (BID) | ORAL | Status: DC

## 2015-07-04 NOTE — ED Notes (Signed)
Pt. reports low abdominal pain with nausea and vomitting onset this evening , denies diarrhea/ no fever or chills.

## 2015-07-04 NOTE — ED Provider Notes (Signed)
CSN: [id number]     Arrival date & time 07/04/15  0214 History   First MD Initiated Contact with Patient 07/04/15 0601     Chief Complaint  Patient presents with  . Abdominal Pain     (Consider location/radiation/quality/duration/timing/severity/associated sxs/prior Treatment) HPI Comments: Patient with history of asthma presents with complaint of lower abdominal pain starting acutely at approximately 1:30 AM today. Pain with patient from sleep. It is described as cramping, nonradiating. Pain was severe or patient had one episode of vomiting with the pain. She has not had associated fever, chest pain, shortness of breath, urinary symptoms, vaginal bleeding or discharge. Patient is sexually active with one partner. She uses condoms. LMP was one month ago. No treatments prior to arrival. Patient does have a history of ovarian cysts and cholecystectomy. Onset of symptoms acute. Course is waxing and waning. Nothing makes symptoms better. Pain is worse when she is from a sitting to standing position.  Patient is a 25 y.o. female presenting with abdominal pain. The history is provided by the patient.  Abdominal Pain Associated symptoms: nausea and vomiting   Associated symptoms: no chest pain, no cough, no diarrhea, no dysuria, no fever and no sore throat     Past Medical History  Diagnosis Date  . Asthma   . Gestational HTN 2012    when pregnant with son   . Arthritis     "fingers" (06/20/2015)  . Migraine     "not often anymore" (06/20/2015)   Past Surgical History  Procedure Laterality Date  . Tonsillectomy/adenoidectomy/turbinate reduction  ~ 2008  . Tonsillectomy and adenoidectomy  ~ 2008  . Laparoscopic cholecystectomy  2014   Family History  Problem Relation Age of Onset  . Anesthesia problems Neg Hx   . Asthma Neg Hx    Social History  Substance Use Topics  . Smoking status: Former Smoker -- 0.00 packs/day for 4 years    Types: Cigarettes    Quit date: 10/06/2013  .  Smokeless tobacco: Never Used  . Alcohol Use: Yes     Comment: 06/20/2015 "less than once/month"   OB History    Gravida Para Term Preterm AB TAB SAB Ectopic Multiple Living   0 0 0 0 0 0 2      Obstetric Comments   2 children, one boy and girl     Review of Systems  Constitutional: Negative for fever.  HENT: Negative for rhinorrhea and sore throat.   Eyes: Negative for redness.  Respiratory: Negative for cough.   Cardiovascular: Negative for chest pain.  Gastrointestinal: Positive for nausea, vomiting and abdominal pain. Negative for diarrhea.  Genitourinary: Negative for dysuria.  Musculoskeletal: Negative for myalgias.  Skin: Negative for rash.  Neurological: Negative for headaches.    Allergies  Fish allergy  Home Medications   Prior to Admission medications   Medication Sig Start Date End Date Taking? Authorizing Provider  albuterol (VENTOLIN HFA) 108 (90 Base) MCG/ACT inhaler Inhale 2 puffs into the lungs every 6 (six) hours as needed for wheezing or shortness of breath. 06/20/15   Josalyn Funches, MD  Fluticasone Furoate-Vilanterol 100-25 MCG/INH AEPB One inhalation daily Patient taking differently: Inhale 1 puff into the lungs daily. One inhalation daily 06/13/15   Hayden Rasmussen, NP  predniSONE (DELTASONE) 20 MG tablet Please follow the following taper:  x 4 days, then  x 4 days, then  x 4 days, then off. 06/22/15   Gwynn Burly, DO   BP 118/84 mmHg  Pulse 78  Temp(Src) 98.4 F (36.9 C) (Oral)  Resp 16  SpO2 97%  LMP 05/29/2015   Physical Exam  Constitutional: She appears well-developed and well-nourished.  HENT:  Head: Normocephalic and atraumatic.  Eyes: Conjunctivae are normal. Right eye exhibits no discharge. Left eye exhibits no discharge.  Neck: Normal range of motion. Neck supple.  Cardiovascular: Normal rate, regular rhythm and normal heart sounds.   Pulmonary/Chest: Effort normal and breath sounds normal.  Abdominal: Soft. She exhibits  no distension. There is tenderness (moderate, suprapubic area, no right sided tenderness or pain at McBurney's). There is no rebound and no guarding.  Genitourinary: There is no tenderness on the right labia. There is no tenderness on the left labia. Uterus is tender. Cervix exhibits no motion tenderness, no discharge and no friability. Right adnexum displays tenderness (mild). Right adnexum displays no mass. Left adnexum displays no mass and no tenderness. No tenderness in the vagina. No vaginal discharge found.  Neurological: She is alert.  Skin: Skin is warm and dry.  Psychiatric: She has a normal mood and affect.  Nursing note and vitals reviewed.   ED Course  Procedures (including critical care time) Labs Review Labs Reviewed  WET PREP, GENITAL - Abnormal; Notable for the following:    WBC, Wet Prep HPF POC FEW (*)    All other components within normal limits  COMPREHENSIVE METABOLIC PANEL - Abnormal; Notable for the following:    Glucose, Bld 118 (*)    Total Protein 6.3 (*)    All other components within normal limits  CBC - Abnormal; Notable for the following:    WBC 13.2 (*)    All other components within normal limits  LIPASE, BLOOD  URINALYSIS, ROUTINE W REFLEX MICROSCOPIC (NOT AT [facility])  POC URINE PREG, ED  GC/CHLAMYDIA PROBE AMP ([place]) NOT AT [facility]    Imaging Review US Transvaginal Non-ob  07/04/2015  CLINICAL DATA:  Acute right and midline pelvic pain EXAM: TRANSABDOMINAL AND TRANSVAGINAL ULTRASOUND OF PELVIS DOPPLER ULTRASOUND OF OVARIES TECHNIQUE: Both transabdominal and transvaginal ultrasound examinations of the pelvis were performed. Transabdominal technique was performed for global imaging of the pelvis including uterus, ovaries, adnexal regions, and pelvic cul-de-sac. It was necessary to proceed with endovaginal exam following the transabdominal exam to visualize the uterus and ovaries in better detail. Color and duplex Doppler ultrasound was utilized to  evaluate blood flow to the ovaries. COMPARISON:  03/28/2013. FINDINGS: Uterus Measurements: 9.7 x 5.1 x 4.0 cm. No fibroids or other mass visualized. Endometrium Thickness: 15.5 mm.  No focal abnormality visualized. Right ovary Measurements: 3.8 x 2.5 x 1.9 cm. Normal appearance/no adnexal mass. Left ovary Measurements: 6.6 x 6.8 x 5.2 cm. Large, oval, circumscribed cyst containing diffuse internal echoes and thin internal septations without internal blood flow. This measures 5.9 x 5.9 x 4.5 cm. Pulsed Doppler evaluation of both ovaries demonstrates normal low-resistance arterial and venous waveforms. Other findings Small amount of free peritoneal fluid containing low level internal echoes. IMPRESSION: 1. Interval 6.8 cm complicated left ovarian cyst. This is most likely a hemorrhagic cyst. Other possibilities include an infected cyst and endometrioma. 2. Small amount of free peritoneal fluid containing low level internal echoes. This could represent hemorrhagic or infected fluid. 3. Normal appearing uterus and right ovary. 4. No evidence of ovarian torsion. Electronically Signed   By: Beckie Salts M.D.   On: 07/04/2015 08:39   US Pelvis Complete  07/04/2015  CLINICAL DATA:  Acute right and midline pelvic pain  EXAM: TRANSABDOMINAL AND TRANSVAGINAL ULTRASOUND OF PELVIS DOPPLER ULTRASOUND OF OVARIES TECHNIQUE: Both transabdominal and transvaginal ultrasound examinations of the pelvis were performed. Transabdominal technique was performed for global imaging of the pelvis including uterus, ovaries, adnexal regions, and pelvic cul-de-sac. It was necessary to proceed with endovaginal exam following the transabdominal exam to visualize the uterus and ovaries in better detail. Color and duplex Doppler ultrasound was utilized to evaluate blood flow to the ovaries. COMPARISON:  03/28/2013. FINDINGS: Uterus Measurements: 9.7 x 5.1 x 4.0 cm. No fibroids or other mass visualized. Endometrium Thickness: 15.5 mm.  No focal  abnormality visualized. Right ovary Measurements: 3.8 x 2.5 x 1.9 cm. Normal appearance/no adnexal mass. Left ovary Measurements: 6.6 x 6.8 x 5.2 cm. Large, oval, circumscribed cyst containing diffuse internal echoes and thin internal septations without internal blood flow. This measures 5.9 x 5.9 x 4.5 cm. Pulsed Doppler evaluation of both ovaries demonstrates normal low-resistance arterial and venous waveforms. Other findings Small amount of free peritoneal fluid containing low level internal echoes. IMPRESSION: 1. Interval 6.8 cm complicated left ovarian cyst. This is most likely a hemorrhagic cyst. Other possibilities include an infected cyst and endometrioma. 2. Small amount of free peritoneal fluid containing low level internal echoes. This could represent hemorrhagic or infected fluid. 3. Normal appearing uterus and right ovary. 4. No evidence of ovarian torsion. Electronically Signed   By: Beckie Salts M.D.   On: 07/04/2015 08:39   [place] Art/ven Flow Abd Pelv Doppler  07/04/2015  CLINICAL DATA:  Acute right and midline pelvic pain EXAM: TRANSABDOMINAL AND TRANSVAGINAL ULTRASOUND OF PELVIS DOPPLER ULTRASOUND OF OVARIES TECHNIQUE: Both transabdominal and transvaginal ultrasound examinations of the pelvis were performed. Transabdominal technique was performed for global imaging of the pelvis including uterus, ovaries, adnexal regions, and pelvic cul-de-sac. It was necessary to proceed with endovaginal exam following the transabdominal exam to visualize the uterus and ovaries in better detail. Color and duplex Doppler ultrasound was utilized to evaluate blood flow to the ovaries. COMPARISON:  03/28/2013. FINDINGS: Uterus Measurements: 9.7 x 5.1 x 4.0 cm. No fibroids or other mass visualized. Endometrium Thickness: 15.5 mm.  No focal abnormality visualized. Right ovary Measurements: 3.8 x 2.5 x 1.9 cm. Normal appearance/no adnexal mass. Left ovary Measurements: 6.6 x 6.8 x 5.2 cm. Large, oval, circumscribed cyst  containing diffuse internal echoes and thin internal septations without internal blood flow. This measures 5.9 x 5.9 x 4.5 cm. Pulsed Doppler evaluation of both ovaries demonstrates normal low-resistance arterial and venous waveforms. Other findings Small amount of free peritoneal fluid containing low level internal echoes. IMPRESSION: 1. Interval 6.8 cm complicated left ovarian cyst. This is most likely a hemorrhagic cyst. Other possibilities include an infected cyst and endometrioma. 2. Small amount of free peritoneal fluid containing low level internal echoes. This could represent hemorrhagic or infected fluid. 3. Normal appearing uterus and right ovary. 4. No evidence of ovarian torsion. Electronically Signed   By: Beckie Salts M.D.   On: 07/04/2015 08:39   I have personally reviewed and evaluated these images and lab results as part of my medical decision-making.   EKG Interpretation None       6:36 AM Patient seen and examined. Work-up initiated. Reviewed labs with patient. Will complete work-up with pelvic exam. She has a chronic appearing leukocytosis.   Vital signs reviewed and are as follows: BP 118/84 mmHg  Pulse 78  Temp(Src) 98.4 F (36.9 C) (Oral)  Resp 16  SpO2 97%  LMP 05/29/2015  7:03 AM  Pelvic performed by Katrinka Blazing PA-S2 under my supervision. Patient with mainly mid-line tenderness but mild R sided tenderness. Will obtain US.   9:05 AM [place] as above. No obvious source of pain. Given free fluid in pelvis, feel that CT is needed to rule-out appendicitis or other intraabdominal infection. Will proceed with this. She will need GYN follow-up for L ovarian cyst.   11:11 AM CT as above. Patient informed. She will need OB/GYN follow-up for delineation of ovarian cysts versus other etiology. Referral given.  NSAIDs for pain. The patient was urged to return to the Emergency Department immediately with worsening of current symptoms, worsening abdominal pain, persistent vomiting, blood  noted in stools, fever, or any other concerns. The patient verbalized understanding.    MDM   Final diagnoses:  Cyst of ovary, unspecified laterality   Patient with pelvic pain, probable ovarian cyst which is midline. This is likely etiology of patient's pain. No vaginal infection. No signs of appendicitis. Labs are at baseline. CT performed due to unequivocal pelvic ultrasound with fluid, prednisone use. OB/GYN follow-up advised to determine clear etiology of cyst.   Renne Crigler, PA-C 07/04/15 1113  Laurence Spates, MD 07/04/15 [phone]

## 2015-07-04 NOTE — ED Notes (Signed)
PA at bedside.

## 2015-07-04 NOTE — Discharge Instructions (Signed)
Please read and follow all provided instructions.  Your diagnoses today include:  1. Pelvic pain in female   2. Cyst of ovary, unspecified laterality     Tests performed today include:  Blood counts and electrolytes  Blood tests to check kidney function  Urine test to look for infection and pregnancy (in women)  Ultrasound/CT - suggest ovarian cyst, you need to have this rechecked to confirm cyst is on the ovary  Vital signs. See below for your results today.   Medications prescribed:   Naproxen - anti-inflammatory pain medication  Do not exceed  naproxen every 12 hours, take with food  You have been prescribed an anti-inflammatory medication or NSAID. Take with food. Take smallest effective dose for the shortest duration needed for your pain. Stop taking if you experience stomach pain or vomiting.   Take any prescribed medications only as directed.  Home care instructions:   Follow any educational materials contained in this packet.  Follow-up instructions: Please follow-up with your primary care provider in the next 3 days for further evaluation of your symptoms.    Also, please contact the women's health doctor listed for evaluation of your cyst.   Return instructions:  SEEK IMMEDIATE MEDICAL ATTENTION IF:  The pain does not go away or becomes severe   A temperature above 101F develops   Repeated vomiting occurs (multiple episodes)   The pain becomes localized to portions of the abdomen. The right side could possibly be appendicitis. In an adult, the left lower portion of the abdomen could be colitis or diverticulitis.   Blood is being passed in stools or vomit (bright red or black tarry stools)   You develop chest pain, difficulty breathing, dizziness or fainting, or become confused, poorly responsive, or inconsolable (young children)  If you have any other emergent concerns regarding your health  Additional Information: Abdominal (belly) pain can be  caused by many things. Your caregiver performed an examination and possibly ordered blood/urine tests and imaging (CT scan, x-rays, ultrasound). Many cases can be observed and treated at home after initial evaluation in the emergency department. Even though you are being discharged home, abdominal pain can be unpredictable. Therefore, you need a repeated exam if your pain does not resolve, returns, or worsens. Most patients with abdominal pain don't have to be admitted to the hospital or have surgery, but serious problems like appendicitis and gallbladder attacks can start out as nonspecific pain. Many abdominal conditions cannot be diagnosed in one visit, so follow-up evaluations are very important.  Your vital signs today were: BP 113/70 mmHg   Pulse 75   Temp(Src) 98.4 F (36.9 C) (Oral)   Resp 18   SpO2 95%   LMP 05/29/2015 If your blood pressure (bp) was elevated above 135/85 this visit, please have this repeated by your doctor within one month. --------------

## 2015-07-07 ENCOUNTER — Inpatient Hospital Stay: Payer: Self-pay | Admitting: Family Medicine

## 2015-07-17 ENCOUNTER — Ambulatory Visit: Payer: Self-pay | Attending: Family Medicine | Admitting: Family Medicine

## 2015-07-17 ENCOUNTER — Encounter: Payer: Self-pay | Admitting: Family Medicine

## 2015-07-17 VITALS — BP 109/78 | HR 85 | Temp 98.3°F | Resp 16 | Ht 66.0 in | Wt 212.0 lb

## 2015-07-17 DIAGNOSIS — Z124 Encounter for screening for malignant neoplasm of cervix: Secondary | ICD-10-CM

## 2015-07-17 DIAGNOSIS — R102 Pelvic and perineal pain: Secondary | ICD-10-CM | POA: Insufficient documentation

## 2015-07-17 DIAGNOSIS — Z1272 Encounter for screening for malignant neoplasm of vagina: Secondary | ICD-10-CM | POA: Insufficient documentation

## 2015-07-17 DIAGNOSIS — B373 Candidiasis of vulva and vagina: Secondary | ICD-10-CM

## 2015-07-17 DIAGNOSIS — Z79899 Other long term (current) drug therapy: Secondary | ICD-10-CM | POA: Insufficient documentation

## 2015-07-17 DIAGNOSIS — J45998 Other asthma: Secondary | ICD-10-CM

## 2015-07-17 DIAGNOSIS — J45901 Unspecified asthma with (acute) exacerbation: Secondary | ICD-10-CM | POA: Insufficient documentation

## 2015-07-17 DIAGNOSIS — Z87891 Personal history of nicotine dependence: Secondary | ICD-10-CM | POA: Insufficient documentation

## 2015-07-17 DIAGNOSIS — IMO0002 Reserved for concepts with insufficient information to code with codable children: Secondary | ICD-10-CM

## 2015-07-17 DIAGNOSIS — Z0000 Encounter for general adult medical examination without abnormal findings: Secondary | ICD-10-CM

## 2015-07-17 DIAGNOSIS — J4530 Mild persistent asthma, uncomplicated: Secondary | ICD-10-CM | POA: Insufficient documentation

## 2015-07-17 DIAGNOSIS — B3731 Acute candidiasis of vulva and vagina: Secondary | ICD-10-CM

## 2015-07-17 LAB — POCT GLYCOSYLATED HEMOGLOBIN (HGB A1C): HEMOGLOBIN A1C: 5.5

## 2015-07-17 MED ORDER — ALBUTEROL SULFATE HFA 108 (90 BASE) MCG/ACT IN AERS: 2.0000 | INHALATION_SPRAY | Freq: Four times a day (QID) | RESPIRATORY_TRACT | Status: DC | PRN

## 2015-07-17 MED ORDER — FLUTICASONE FUROATE-VILANTEROL 100-25 MCG/INH IN AEPB: 1.0000 | INHALATION_SPRAY | Freq: Every day | RESPIRATORY_TRACT | Status: DC

## 2015-07-17 MED FILL — **BREO ELLIPTA 100-25 MCG I: 100-25MCG | 30 days supply | Qty: 30 | Fill #0

## 2015-07-17 MED FILL — $VENTOLIN HFA 18G INHALER: 108 (90 BAS | 25 days supply | Qty: 18 | Fill #3

## 2015-07-17 NOTE — Progress Notes (Signed)
F/U Asthma No problems since last ER visit  No tobacco user  No suicidal thought in the past two weeks

## 2015-07-17 NOTE — Assessment & Plan Note (Addendum)
A; persistent asthma with recent exacerbation P: Refilled breo Albuterol prn pulm evaluation

## 2015-07-17 NOTE — Assessment & Plan Note (Signed)
Resolved

## 2015-07-17 NOTE — Progress Notes (Signed)
Subjective:  Patient ID: Catherine Porter, female    DOB: 1991-05-01  Age: 25 y.o. MRN: [id number]  CC: Asthma   HPI HILDUR BAYER presents for   1. Asthma: she was hospitalized for exacerbation from 06/19/09/2017. She was started on breo which is helping. She has used albuterol once last night and once today. No cough, CP or SOB. Only asthmatic in family. No cigarette smoke exposure.   2. Pelvic pain: started acutely on 07/04/2015. She went to ED. W/u was negative for pregnancy and wet prep negative except a dew WBC. CT abdomen and pelvis revealed the following:  IMPRESSION: 1. 6.3 x 6.1 cm fluid collection posterior to the uterus and anterior to the rectum along the midline. This may reflect an ovarian cyst versus enteric duplication cyst versus seroma or Lymphangioma.  She has not had sex since onset of pain. She did have a menstrual period that was not too painful or heavy. She has not returned to work since ED visit. She has FMLA paperwork.   Social History  Substance Use Topics  . Smoking status: Former Smoker -- 0.00 packs/day for 4 years    Types: Cigarettes    Quit date: 10/06/2013  . Smokeless tobacco: Never Used  . Alcohol Use: Yes     Comment: 06/20/2015 "less than once/month"    Outpatient Prescriptions Prior to Visit  Medication Sig Dispense Refill  . albuterol (VENTOLIN HFA) 108 (90 Base) MCG/ACT inhaler Inhale 2 puffs into the lungs every 6 (six) hours as needed for wheezing or shortness of breath. 54 g 3  . Fluticasone Furoate-Vilanterol 100-25 MCG/INH AEPB One inhalation daily (Patient taking differently: Inhale 1 puff into the lungs daily. One inhalation daily) 28 each 0  . naproxen (NAPROSYN) 500 MG tablet Take 1 tablet (500 mg total) by mouth 2 (two) times daily. (Patient not taking: Reported on 07/17/2015) 20 tablet 0   No facility-administered medications prior to visit.    ROS Review of Systems  Constitutional: Negative for fever and chills.  Eyes:  Negative for visual disturbance.  Respiratory: Positive for wheezing (last night ). Negative for shortness of breath.   Cardiovascular: Negative for chest pain.  Gastrointestinal: Negative for nausea, vomiting, abdominal pain, diarrhea, constipation, blood in stool, abdominal distention, anal bleeding and rectal pain.  Genitourinary: Positive for pelvic pain. Negative for dysuria, urgency, frequency, hematuria, flank pain, decreased urine volume, vaginal bleeding, vaginal discharge, enuresis, difficulty urinating, genital sores, vaginal pain, menstrual problem and dyspareunia.  Musculoskeletal: Negative for back pain and arthralgias.  Skin: Negative for rash.  Allergic/Immunologic: Negative for immunocompromised state.  Hematological: Negative for adenopathy. Does not bruise/bleed easily.  Psychiatric/Behavioral: Negative for suicidal ideas and dysphoric mood.    Objective:  BP 109/78 mmHg  Pulse 85  Temp(Src) 98.3 F (36.8 C) (Oral)  Resp 16  Ht  (1.676 m)  Wt 212 lb (96.163 kg)  BMI 34.23 kg/m2  SpO2 98%  PF 550 L/min  LMP 07/11/2015  BP/Weight 07/17/2015 07/04/2015 06/21/2015  Systolic BP 109 113 151  Diastolic BP 78 70 98  Wt. (Lbs) 212 - -  BMI 34.23 - -    Physical Exam  Constitutional: She is oriented to person, place, and time. She appears well-developed and well-nourished. No distress.  HENT:  Head: Normocephalic and atraumatic.  Cardiovascular: Normal rate, regular rhythm, normal heart sounds and intact distal pulses.   Pulmonary/Chest: Effort normal and breath sounds normal.  Genitourinary: Uterus normal. Pelvic exam was performed with patient  prone. There is no rash, tenderness or lesion on the right labia. There is no rash, tenderness or lesion on the left labia. Uterus is not enlarged and not tender. Cervix exhibits motion tenderness. Cervix exhibits no discharge and no friability. Right adnexum displays no mass, no tenderness and no fullness. Left adnexum  displays no mass, no tenderness and no fullness. Vaginal discharge found.    Musculoskeletal: She exhibits no edema.  Lymphadenopathy:       Right: No inguinal adenopathy present.       Left: No inguinal adenopathy present.  Neurological: She is alert and oriented to person, place, and time.  Skin: Skin is warm and dry. No rash noted.  Psychiatric: She has a normal mood and affect.    Lab Results  Component Value Date   HGBA1C 5.50 07/17/2015   Lab Results  Component Value Date   HGBA1C 5.50 07/17/2015    Assessment & Plan:   Jacoria was seen today for asthma and pelvic pain.  Diagnoses and all orders for this visit:  Healthcare maintenance -     HgB A1c  Persistent asthma -     Discontinue: Fluticasone Furoate-Vilanterol 100-25 MCG/INH AEPB; Inhale 1 puff into the lungs daily. One inhalation daily -     Discontinue: albuterol (VENTOLIN HFA) 108 (90 Base) MCG/ACT inhaler; Inhale 2 puffs into the lungs every 6 (six) hours as needed for wheezing or shortness of breath. -     albuterol (VENTOLIN HFA) 108 (90 Base) MCG/ACT inhaler; Inhale 2 puffs into the lungs every 6 (six) hours as needed for wheezing or shortness of breath. -     Fluticasone Furoate-Vilanterol 100-25 MCG/INH AEPB; Inhale 1 puff into the lungs daily. One inhalation daily  Pap smear for cervical cancer screening -     Cytology - PAP [place]  Pelvic pain in female  Asthma exacerbation    No orders of the defined types were placed in this encounter.    Follow-up: No Follow-up on file.   Dessa Phi MD

## 2015-07-17 NOTE — Assessment & Plan Note (Addendum)
Pap done today Agree with gyn appt to f/u CT findings  FMLA paperwork will be completed with plan for patient to return to light duty next week

## 2015-07-17 NOTE — Patient Instructions (Signed)
Cornella was seen today for asthma.  Diagnoses and all orders for this visit:  Healthcare maintenance -     HgB A1c  Persistent asthma -     Discontinue: Fluticasone Furoate-Vilanterol 100-25 MCG/INH AEPB; Inhale 1 puff into the lungs daily. One inhalation daily -     Discontinue: albuterol (VENTOLIN HFA) 108 (90 Base) MCG/ACT inhaler; Inhale 2 puffs into the lungs every 6 (six) hours as needed for wheezing or shortness of breath. -     albuterol (VENTOLIN HFA) 108 (90 Base) MCG/ACT inhaler; Inhale 2 puffs into the lungs every 6 (six) hours as needed for wheezing or shortness of breath. -     Fluticasone Furoate-Vilanterol 100-25 MCG/INH AEPB; Inhale 1 puff into the lungs daily. One inhalation daily  Pap smear for cervical cancer screening -     Cytology - PAP [place]   You will be called with pap results  Establish with pulmonology here for asthma Keep gyn appt You will be called when FMLA paperwork is ready for pick up   F/u with me in 3 months   Dr. Armen Pickup

## 2015-07-18 ENCOUNTER — Ambulatory Visit: Payer: Self-pay | Admitting: Family Medicine

## 2015-07-19 LAB — CERVICOVAGINAL ANCILLARY ONLY
CHLAMYDIA, DNA PROBE: NEGATIVE
NEISSERIA GONORRHEA: NEGATIVE
Wet Prep (BD Affirm): POSITIVE — AB

## 2015-07-19 LAB — CYTOLOGY - PAP

## 2015-07-19 MED ORDER — FLUCONAZOLE 150 MG PO TABS: 150.0000 mg | ORAL_TABLET | ORAL | Status: DC

## 2015-07-19 NOTE — Addendum Note (Signed)
Addended by: Dessa Phi on: 07/19/2015 02:20 PM   Modules accepted: Orders

## 2015-07-21 ENCOUNTER — Telehealth: Payer: Self-pay | Admitting: Family Medicine

## 2015-07-21 NOTE — Telephone Encounter (Signed)
Patient called stating that paperwork needs to be faxed to employer. Paperwork for FMLA Patient needs paperwork sent by 5 pm. Today,  Jul 21 2015 Please follow up.    [employer] Fax [phone]

## 2015-07-21 NOTE — Telephone Encounter (Signed)
FMLA form faxed  Original obtained for patient's medical records

## 2015-07-25 ENCOUNTER — Telehealth: Payer: Self-pay

## 2015-07-25 NOTE — Telephone Encounter (Signed)
-----   Message from Dessa Phi, MD sent at 07/19/2015  2:19 PM EST ----- Normal pap Yeast on wet prep, this was most likely the thick discharge I noted on your exam  Diflucan ordered

## 2015-07-25 NOTE — Telephone Encounter (Signed)
CMA called patient, patient VM stated, " Subscriber you dial is not in service."

## 2015-07-25 NOTE — Telephone Encounter (Signed)
-----   Message from Josalyn Funches, MD sent at 07/19/2015  2:19 PM EST ----- Normal pap Yeast on wet prep, this was most likely the thick discharge I noted on your exam  Diflucan ordered 

## 2015-07-27 NOTE — Telephone Encounter (Signed)
CMA called patient, patient cell #[phone] stated, "Tacy Learn dialed is not in service." I called patient's emergency contact number which is her mother's cell #[phone]. Patient was available, she verified her name and DOB. She was given lab results, told about med ordered (Diflucan). Patient verbalized she understood with no further questions.

## 2015-07-31 ENCOUNTER — Encounter: Payer: Self-pay | Admitting: Obstetrics & Gynecology

## 2015-08-04 ENCOUNTER — Encounter: Payer: Self-pay | Admitting: Clinical

## 2015-08-04 NOTE — Progress Notes (Signed)
Depression screen [facility] 2/9 07/17/2015 03/30/2015  Decreased Interest 3 0  Down, Depressed, Hopeless 3 1  PHQ - 2 Score 6 1  Altered sleeping 2 -  Tired, decreased energy 2 -  Change in appetite 2 -  Feeling bad or failure about yourself  0 -  Trouble concentrating 0 -  Moving slowly or fidgety/restless 0 -  Suicidal thoughts 0 -  PHQ-9 Score 12 -    GAD 7 : Generalized Anxiety Score 07/17/2015  Nervous, Anxious, on Edge 3  Control/stop worrying 3  Worry too much - different things 3  Trouble relaxing 2  Restless 3  Easily annoyed or irritable 2  Afraid - awful might happen 0  Total GAD 7 Score 16

## 2015-08-21 MED FILL — **BREO ELLIPTA 100-25 MCG I: 100-25MCG | 14 days supply | Qty: 28 | Fill #1

## 2015-08-24 ENCOUNTER — Encounter: Payer: Self-pay | Admitting: Obstetrics & Gynecology

## 2015-09-01 MED FILL — SYMBICORT 160-4.5 MCG INH: 160-4.5 | 30 days supply | Qty: 10 | Fill #1

## 2015-09-01 MED FILL — $VENTOLIN HFA 18G INHALER: 108 (90 BAS | 25 days supply | Qty: 18 | Fill #4

## 2015-10-23 MED FILL — $VENTOLIN HFA 18G INHALER: 108 (90 BAS | 25 days supply | Qty: 18 | Fill #5

## 2015-10-23 MED FILL — SYMBICORT 160-4.5 MCG INH: 160-4.5 | 30 days supply | Qty: 10 | Fill #2

## 2015-12-13 MED FILL — $VENTOLIN HFA 18G INHALER: 108 (90 BAS | 30 days supply | Qty: 18 | Fill #0

## 2015-12-13 MED FILL — SYMBICORT 160-4.5 MCG INH: 160-4.5 | 30 days supply | Qty: 10 | Fill #3

## 2016-02-06 MED FILL — $VENTOLIN HFA 18G INHALER: 108 (90 BAS | 30 days supply | Qty: 18 | Fill #1

## 2016-02-06 MED FILL — **BREO ELLIPTA 100-25 MCG I: 100-25MCG | 28 days supply | Qty: 56 | Fill #2

## 2016-02-13 MED FILL — **BREO ELLIPTA 100-25 MCG I: 100-25MCG | 27 days supply | Qty: 54 | Fill #3

## 2016-03-27 MED FILL — $VENTOLIN HFA 18G INHALER: 108 (90 BAS | 30 days supply | Qty: 18 | Fill #2

## 2016-04-05 ENCOUNTER — Other Ambulatory Visit: Payer: Self-pay | Admitting: Family Medicine

## 2016-04-05 DIAGNOSIS — IMO0002 Reserved for concepts with insufficient information to code with codable children: Secondary | ICD-10-CM

## 2016-04-05 MED FILL — VENTOLIN HFA 90 MCG INHALER: 108 (90 BAS | 30 days supply | Qty: 18 | Fill #3

## 2016-04-05 MED FILL — BREO ELLIPTA 100-25 MCG INH: 100-25 | 60 days supply | Qty: 60 | Fill #0

## 2016-04-08 ENCOUNTER — Other Ambulatory Visit: Payer: Self-pay | Admitting: Pharmacist

## 2016-04-08 DIAGNOSIS — J45909 Unspecified asthma, uncomplicated: Secondary | ICD-10-CM

## 2016-04-08 MED ORDER — FLUTICASONE FUROATE-VILANTEROL 100-25 MCG/INH IN AEPB: INHALATION_SPRAY | RESPIRATORY_TRACT | 0 refills | Status: DC

## 2016-04-09 ENCOUNTER — Emergency Department (HOSPITAL_COMMUNITY): Payer: Self-pay

## 2016-04-09 ENCOUNTER — Encounter (HOSPITAL_COMMUNITY): Payer: Self-pay | Admitting: Emergency Medicine

## 2016-04-09 ENCOUNTER — Emergency Department (HOSPITAL_COMMUNITY)
Admission: EM | Admit: 2016-04-09 | Discharge: 2016-04-09 | Disposition: A | Discharge status: Home / Self Care | Payer: Self-pay | Attending: Emergency Medicine | Admitting: Emergency Medicine

## 2016-04-09 DIAGNOSIS — J45901 Unspecified asthma with (acute) exacerbation: Secondary | ICD-10-CM

## 2016-04-09 DIAGNOSIS — J45909 Unspecified asthma, uncomplicated: Secondary | ICD-10-CM

## 2016-04-09 DIAGNOSIS — Z87891 Personal history of nicotine dependence: Secondary | ICD-10-CM | POA: Insufficient documentation

## 2016-04-09 DIAGNOSIS — J4541 Moderate persistent asthma with (acute) exacerbation: Secondary | ICD-10-CM | POA: Insufficient documentation

## 2016-04-09 DIAGNOSIS — IMO0002 Reserved for concepts with insufficient information to code with codable children: Secondary | ICD-10-CM

## 2016-04-09 MED ORDER — PREDNISONE 20 MG PO TABS
60.0000 mg | ORAL_TABLET | Freq: Once | ORAL | Status: AC
Start: 2016-04-09 — End: 2016-04-09
  Administered 2016-04-09: 60 mg via ORAL
  Filled 2016-04-09: qty 3

## 2016-04-09 MED ORDER — IPRATROPIUM BROMIDE 0.02 % IN SOLN
0.5000 mg | Freq: Once | RESPIRATORY_TRACT | Status: AC
  Administered 2016-04-09: 0.5 mg via RESPIRATORY_TRACT
  Filled 2016-04-09: qty 2.5

## 2016-04-09 MED ORDER — ALBUTEROL SULFATE (2.5 MG/3ML) 0.083% IN NEBU
INHALATION_SOLUTION | RESPIRATORY_TRACT | Status: AC
  Filled 2016-04-09: qty 6

## 2016-04-09 MED ORDER — ALBUTEROL SULFATE (2.5 MG/3ML) 0.083% IN NEBU
5.0000 mg | INHALATION_SOLUTION | Freq: Once | RESPIRATORY_TRACT | Status: AC
Start: 2016-04-09 — End: 2016-04-09
  Administered 2016-04-09: 5 mg via RESPIRATORY_TRACT
  Filled 2016-04-09: qty 6

## 2016-04-09 MED ORDER — PREDNISONE 10 MG PO TABS: 60.0000 mg | ORAL_TABLET | Freq: Every day | ORAL | 0 refills | Status: DC

## 2016-04-09 MED ORDER — ALBUTEROL SULFATE HFA 108 (90 BASE) MCG/ACT IN AERS
2.0000 | INHALATION_SPRAY | RESPIRATORY_TRACT | Status: DC
  Administered 2016-04-09: 2 via RESPIRATORY_TRACT
  Filled 2016-04-09: qty 6.7

## 2016-04-09 MED ORDER — ALBUTEROL SULFATE (2.5 MG/3ML) 0.083% IN NEBU
5.0000 mg | INHALATION_SOLUTION | Freq: Once | RESPIRATORY_TRACT | Status: AC
  Administered 2016-04-09: 5 mg via RESPIRATORY_TRACT

## 2016-04-09 MED ORDER — FLUTICASONE FUROATE-VILANTEROL 100-25 MCG/INH IN AEPB: INHALATION_SPRAY | RESPIRATORY_TRACT | 0 refills | Status: DC

## 2016-04-09 MED FILL — predniSONE 10 MG TABS: 10 | 5 days supply | Qty: 30 | Fill #0

## 2016-04-09 NOTE — ED Provider Notes (Signed)
[facility]-EMERGENCY DEPT Provider Note   CSN: [id number] Arrival date & time: 04/09/16  0014  By signing my name below, I, Arianna Nassar, attest that this documentation has been prepared under the direction and in the presence of Azalia BilisKevin Abbygael Curtiss, MD.  Electronically Signed: Octavia HeirArianna Nassar, ED Scribe. 04/09/16. 3:25 AM.    History   Chief Complaint Chief Complaint  Patient presents with  . Asthma    The history is provided by the patient. No language interpreter was used.   HPI Comments: Catherine Porter is a 25 y.o. female who has a PMhx of asthma presents to the Emergency Department complaining of an asthma exacerbation that started this afternoon. Pt states that she had an asthma attack this afternoon with wheezing and coughing. She has associated wheezing and says that she feels as if she has a piece of "hair" stuck in the back of her throat. Pt notes she is on Breo and Ventolin for her asthma but states that she ran out of her medication. She received a breathing treatment in triage that helped with her symptoms temporarily. Denies fever, any cold symptoms or any other complaints.  Past Medical History:  Diagnosis Date  . Arthritis    "fingers" (06/20/2015)  . Asthma   . Gestational HTN 2012   when pregnant with son   . Migraine    "not often anymore" (06/20/2015)    Patient Active Problem List   Diagnosis Date Noted  . Pelvic pain in female 07/17/2015  . Persistent asthma 03/30/2015    Past Surgical History:  Procedure Laterality Date  . LAPAROSCOPIC CHOLECYSTECTOMY  2014  . TONSILLECTOMY AND ADENOIDECTOMY  ~ 2008  . TONSILLECTOMY/ADENOIDECTOMY/TURBINATE REDUCTION  ~ 2008    OB History    Gravida Para Term Preterm AB Living   2 2 2  0 0 2   SAB TAB Ectopic Multiple Live Births   0 0 0 0 2      Obstetric Comments   2 children, one boy and girl       Home Medications    Prior to Admission medications   Medication Sig Start Date End Date Taking? Authorizing  Provider  albuterol (VENTOLIN HFA) 108 (90 Base) MCG/ACT inhaler Inhale 2 puffs into the lungs every 6 (six) hours as needed for wheezing or shortness of breath. 07/17/15   Josalyn Funches, MD  fluconazole (DIFLUCAN) 150 MG tablet Take 1 tablet (150 mg total) by mouth every 3 (three) days. 07/19/15   Josalyn Funches, MD  fluticasone furoate-vilanterol (BREO ELLIPTA) 100-25 MCG/INH AEPB INHALE 1 PUFF INTO THE LUNGS DAILY. 04/08/16   Dessa PhiJosalyn Funches, MD    Family History Family History  Problem Relation Age of Onset  . Anesthesia problems Neg Hx   . Asthma Neg Hx     Social History Social History  Substance Use Topics  . Smoking status: Former Smoker    Packs/day: 0.00    Years: 4.00    Types: Cigarettes    Quit date: 10/06/2013  . Smokeless tobacco: Never Used  . Alcohol use Yes     Allergies   Fish allergy   Review of Systems Review of Systems  A complete 10 system review of systems was obtained and all systems are negative except as noted in the HPI and PMH.    Physical Exam Updated Vital Signs BP (!) 148/107 (BP Location: Left Arm)   Pulse 89   Temp 97.9 F (36.6 C) (Oral)   Resp 18   LMP 02/28/2016 (Approximate)  SpO2 96%   Physical Exam  Constitutional: She is oriented to person, place, and time. She appears well-developed and well-nourished. No distress.  HENT:  Head: Normocephalic and atraumatic.  Eyes: EOM are normal.  Neck: Normal range of motion.  Cardiovascular: Normal rate, regular rhythm and normal heart sounds.   Pulmonary/Chest: Effort normal. She has wheezes.  Wheezing bilaterally  Abdominal: Soft. She exhibits no distension. There is no tenderness.  Musculoskeletal: Normal range of motion.  Neurological: She is alert and oriented to person, place, and time.  Skin: Skin is warm and dry.  Psychiatric: She has a normal mood and affect. Judgment normal.  Nursing note and vitals reviewed.    ED Treatments / Results  DIAGNOSTIC STUDIES: Oxygen  Saturation is 96% on RA, adequate by my interpretation.  COORDINATION OF CARE:  3:23 AM Discussed treatment plan with pt at bedside and pt agreed to plan.  Labs (all labs ordered are listed, but only abnormal results are displayed) Labs Reviewed - No data to display  EKG  EKG Interpretation None       Radiology No results found.  Procedures Procedures (including critical care time)  Medications Ordered in ED Medications  albuterol (PROVENTIL) (2.5 MG/3ML) 0.083% nebulizer solution (not administered)  albuterol (PROVENTIL) (2.5 MG/3ML) 0.083% nebulizer solution 5 mg (5 mg Nebulization Given 04/09/16 0025)     Initial Impression / Assessment and Plan / ED Course  I have reviewed the triage vital signs and the nursing notes.  Pertinent labs & imaging results that were available during my care of the patient were reviewed by me and considered in my medical decision making (see chart for details).  Clinical Course    5:20 AM Patient feels much better this time.  Wheezing nearly resolved.  She would like to go home at this time.  Home with a prescription for 5 day prednisone burst.  Sent home with albuterol inhaler.  Refill of her BREO.  PCP follow-up  Final Clinical Impressions(s) / ED Diagnoses   Final diagnoses:  Moderate asthma with exacerbation, unspecified whether persistent   I personally performed the services described in this documentation, which was scribed in my presence. The recorded information has been reviewed and is accurate.     New Prescriptions New Prescriptions   PREDNISONE (DELTASONE) 10 MG TABLET    Take 6 tablets (60 mg total) by mouth daily.     Azalia BilisKevin Braiden Rodman, MD 04/09/16 [phone]

## 2016-04-09 NOTE — ED Triage Notes (Signed)
Pt. reports asthma attack today with wheezing and dry cough , denies fever or chills , pt. ran out of her rescue  Inhaler.

## 2016-04-16 ENCOUNTER — Ambulatory Visit: Payer: Self-pay | Admitting: Family Medicine

## 2016-04-26 ENCOUNTER — Other Ambulatory Visit: Payer: Self-pay

## 2016-04-26 DIAGNOSIS — J45909 Unspecified asthma, uncomplicated: Secondary | ICD-10-CM

## 2016-04-26 MED ORDER — FLUTICASONE FUROATE-VILANTEROL 100-25 MCG/INH IN AEPB: INHALATION_SPRAY | RESPIRATORY_TRACT | 3 refills | Status: DC

## 2016-05-07 MED FILL — !VENTOLIN HFA INHALER: 108 (90 BAS | 30 days supply | Qty: 18 | Fill #4

## 2016-05-10 MED FILL — $BREO ELLIPTA 100-25 MCG IH: 100-25 MCG | 30 days supply | Qty: 60 | Fill #0

## 2016-07-19 ENCOUNTER — Other Ambulatory Visit: Payer: Self-pay | Admitting: Family Medicine

## 2016-07-19 DIAGNOSIS — IMO0002 Reserved for concepts with insufficient information to code with codable children: Secondary | ICD-10-CM

## 2016-07-19 MED FILL — $VENTOLIN HFA 18G INHALER: 108 (90 BAS | 30 days supply | Qty: 18 | Fill #0

## 2016-07-19 MED FILL — $BREO ELLIPTA 100-25 MCG IH: 100-25 MCG | 30 days supply | Qty: 60 | Fill #1

## 2016-09-15 ENCOUNTER — Encounter (HOSPITAL_COMMUNITY): Payer: Self-pay

## 2016-09-15 ENCOUNTER — Emergency Department (HOSPITAL_COMMUNITY): Payer: Self-pay

## 2016-09-15 DIAGNOSIS — Z87891 Personal history of nicotine dependence: Secondary | ICD-10-CM | POA: Insufficient documentation

## 2016-09-15 DIAGNOSIS — J45901 Unspecified asthma with (acute) exacerbation: Secondary | ICD-10-CM | POA: Insufficient documentation

## 2016-09-15 MED ORDER — ALBUTEROL SULFATE (2.5 MG/3ML) 0.083% IN NEBU
5.0000 mg | INHALATION_SOLUTION | Freq: Once | RESPIRATORY_TRACT | Status: AC
  Administered 2016-09-15: 5 mg via RESPIRATORY_TRACT

## 2016-09-15 MED ORDER — ALBUTEROL SULFATE (2.5 MG/3ML) 0.083% IN NEBU
INHALATION_SOLUTION | RESPIRATORY_TRACT | Status: AC
  Filled 2016-09-15: qty 6

## 2016-09-15 NOTE — ED Triage Notes (Signed)
Pt complaining of asthma and cough x 2 days. Pt states out of daily meds.

## 2016-09-16 ENCOUNTER — Emergency Department (HOSPITAL_COMMUNITY)
Admission: EM | Admit: 2016-09-16 | Discharge: 2016-09-16 | Disposition: A | Discharge status: Home / Self Care | Payer: Self-pay | Attending: Emergency Medicine | Admitting: Emergency Medicine

## 2016-09-16 DIAGNOSIS — J45901 Unspecified asthma with (acute) exacerbation: Secondary | ICD-10-CM

## 2016-09-16 MED ORDER — IPRATROPIUM-ALBUTEROL 0.5-2.5 (3) MG/3ML IN SOLN
3.0000 mL | Freq: Once | RESPIRATORY_TRACT | Status: AC
  Administered 2016-09-16: 3 mL via RESPIRATORY_TRACT
  Filled 2016-09-16: qty 3

## 2016-09-16 MED ORDER — AZITHROMYCIN 250 MG PO TABS: 250.0000 mg | ORAL_TABLET | Freq: Every day | ORAL | 0 refills | Status: DC

## 2016-09-16 MED ORDER — PREDNISONE 20 MG PO TABS
60.0000 mg | ORAL_TABLET | Freq: Once | ORAL | Status: AC
  Administered 2016-09-16: 60 mg via ORAL
  Filled 2016-09-16: qty 3

## 2016-09-16 MED ORDER — PREDNISONE 20 MG PO TABS: 40.0000 mg | ORAL_TABLET | Freq: Every day | ORAL | 0 refills | Status: DC

## 2016-09-16 MED ORDER — ALBUTEROL SULFATE HFA 108 (90 BASE) MCG/ACT IN AERS
2.0000 | INHALATION_SPRAY | RESPIRATORY_TRACT | Status: DC | PRN
  Administered 2016-09-16: 2 via RESPIRATORY_TRACT
  Filled 2016-09-16: qty 6.7

## 2016-09-16 MED FILL — $BREO ELLIPTA 100-25 MCG IH: 100-25 MCG | 30 days supply | Qty: 60 | Fill #2

## 2016-09-16 MED FILL — $VENTOLIN HFA 18G INHALER: 108 (90 BAS | 30 days supply | Qty: 18 | Fill #1

## 2016-09-16 NOTE — ED Provider Notes (Signed)
[facility]PT Provider Note   CSN: [id number] Arrival date & time: 09/15/16  2320     History   Chief Complaint Chief Complaint  Patient presents with  . Asthma    HPI Catherine Porter is a 26 y.o. female.  Patient presents emergency department with chief complaint of asthma exacerbation. She states that she has had wheezing and cough 2 days. She reports being out of her normal medications. She states that she is scheduled to get her medications refilled on Monday. She denies any chest pain. She states that she has felt mildly feverish at home. She denies measuring a temperature. She believes her symptoms are exacerbated by the weather changes. There are no other associated symptoms.   The history is provided by the patient. No language interpreter was used.    Past Medical History:  Diagnosis Date  . Arthritis    "fingers" (06/20/2015)  . Asthma   . Gestational HTN 2012   when pregnant with son   . Migraine    "not often anymore" (06/20/2015)    Patient Active Problem List   Diagnosis Date Noted  . Pelvic pain in female 07/17/2015  . Persistent asthma 03/30/2015    Past Surgical History:  Procedure Laterality Date  . LAPAROSCOPIC CHOLECYSTECTOMY  2014  . TONSILLECTOMY AND ADENOIDECTOMY  ~ 2008  . TONSILLECTOMY/ADENOIDECTOMY/TURBINATE REDUCTION  ~ 2008    OB History    Gravida Para Term Preterm AB Living   0 0 2   SAB TAB Ectopic Multiple Live Births   0 0 0 0 2      Obstetric Comments   2 children, one boy and girl       Home Medications    Prior to Admission medications   Medication Sig Start Date End Date Taking? Authorizing Provider  azithromycin (ZITHROMAX) 250 MG tablet Take 1 tablet (250 mg total) by mouth daily. Take first 2 tablets together, then 1 every day until finished. 09/16/16   Roxy Horseman, PA-C  fluticasone furoate-vilanterol (BREO ELLIPTA) 100-25 MCG/INH AEPB INHALE 1 PUFF INTO THE LUNGS DAILY. 04/26/16   Josalyn Funches,  MD  predniSONE (DELTASONE) 20 MG tablet Take 2 tablets (40 mg total) by mouth daily. 09/16/16   Roxy Horseman, PA-C  VENTOLIN HFA 108 (90 Base) MCG/ACT inhaler INHALE 2 PUFFS EVERY 6 HOURS AS NEEDED FOR WHEEZING OR SHORTNESS OF BREATH 07/19/16   Dessa Phi, MD    Family History Family History  Problem Relation Age of Onset  . Anesthesia problems Neg Hx   . Asthma Neg Hx     Social History Social History  Substance Use Topics  . Smoking status: Former Smoker    Packs/day: 0.00    Years: 4.00    Types: Cigarettes    Quit date: 10/06/2013  . Smokeless tobacco: Never Used  . Alcohol use Yes     Allergies   Patient has no known allergies.   Review of Systems Review of Systems  Respiratory: Positive for cough and wheezing.   All other systems reviewed and are negative.    Physical Exam Updated Vital Signs BP (!) 141/80   Pulse 77   Temp 97.8 F (36.6 C)   Resp (!) 22   SpO2 95%   Physical Exam  Constitutional: She is oriented to person, place, and time. She appears well-developed and well-nourished.  HENT:  Head: Normocephalic and atraumatic.  Eyes: Conjunctivae and EOM are normal. Pupils are equal, round, and reactive to light.  Neck: Normal range of motion. Neck supple.  Cardiovascular: Normal rate and regular rhythm.  Exam reveals no gallop and no friction rub.   No murmur heard. Pulmonary/Chest: Effort normal. No respiratory distress. She has wheezes. She has no rales. She exhibits no tenderness.  Abdominal: Soft. Bowel sounds are normal. She exhibits no distension and no mass. There is no tenderness. There is no rebound and no guarding.  Musculoskeletal: Normal range of motion. She exhibits no edema or tenderness.  Neurological: She is alert and oriented to person, place, and time.  Skin: Skin is warm and dry.  Psychiatric: She has a normal mood and affect. Her behavior is normal. Judgment and thought content normal.  Nursing note and vitals  reviewed.    ED Treatments / Results  Labs (all labs ordered are listed, but only abnormal results are displayed) Labs Reviewed - No data to display  EKG  EKG Interpretation None       Radiology Dg Chest 2 View  Result Date: 09/16/2016 CLINICAL DATA:  Dyspnea and cough times several days EXAM: CHEST  2 VIEW COMPARISON:  04/09/2016 FINDINGS: The heart size and mediastinal contours are within normal limits. Both lungs are clear. The visualized skeletal structures are unremarkable. IMPRESSION: No active cardiopulmonary disease. Electronically Signed   By: Tollie Eth M.D.   On: 09/16/2016 00:20    Procedures Procedures (including critical care time)  Medications Ordered in ED Medications  albuterol (PROVENTIL HFA;VENTOLIN HFA) 108 (90 Base) MCG/ACT inhaler 2 puff (2 puffs Inhalation Given 09/16/16 0118)  albuterol (PROVENTIL) (2.5 MG/3ML) 0.083% nebulizer solution 5 mg (5 mg Nebulization Given 09/15/16 2328)  ipratropium-albuterol (DUONEB) 0.5-2.5 (3) MG/3ML nebulizer solution 3 mL (3 mLs Nebulization Given 09/16/16 0120)  predniSONE (DELTASONE) tablet 60 mg (60 mg Oral Given 09/16/16 0119)     Initial Impression / Assessment and Plan / ED Course  I have reviewed the triage vital signs and the nursing notes.  Pertinent labs & imaging results that were available during my care of the patient were reviewed by me and considered in my medical decision making (see chart for details).     Patient with asthma exacerbation. She has wheezing on exam. Given breathing treatment in triage. I will order another breathing treatment for the patient. Will also prescribe some prednisone and give a dose of prednisone here.  Patient reassessed after second breathing treatment. I personally ambulated the patient with the pulse oximeter, she maintained greater than 96% pulse oxygenation with a heart rate max of 106. She is breathing easily. I believe her stable for outpatient follow-up and discharge this  time. Will discharge home with prednisone. Will also give a Z-Pak due to patient's subjective fevers and cough. She is stable and ready for discharge.  Final Clinical Impressions(s) / ED Diagnoses   Final diagnoses:  Exacerbation of asthma, unspecified asthma severity, unspecified whether persistent    New Prescriptions New Prescriptions   AZITHROMYCIN (ZITHROMAX) 250 MG TABLET    Take 1 tablet (250 mg total) by mouth daily. Take first 2 tablets together, then 1 every day until finished.   PREDNISONE (DELTASONE) 20 MG TABLET    Take 2 tablets (40 mg total) by mouth daily.     Roxy Horseman, PA-C 09/16/16 0144    Shon Baton, MD 09/16/16 [phone]

## 2016-10-15 MED FILL — $BREO ELLIPTA 100-25 MCG IH: 100-25 MCG | 30 days supply | Qty: 60 | Fill #3

## 2016-10-15 MED FILL — $VENTOLIN HFA 18G INHALER: 108 (90 BAS | 30 days supply | Qty: 18 | Fill #2

## 2016-10-23 ENCOUNTER — Encounter: Payer: Self-pay | Admitting: Family Medicine

## 2016-11-10 IMAGING — US US PELVIS COMPLETE
1 series · 13 of 25 positions shown · non-contrast
Comparison: 03/28/2013.

CLINICAL DATA: Acute right and midline pelvic pain

EXAM:
TRANSABDOMINAL AND TRANSVAGINAL ULTRASOUND OF PELVIS
DOPPLER ULTRASOUND OF OVARIES
TECHNIQUE: Both transabdominal and transvaginal ultrasound examinations of the
pelvis were performed. Transabdominal technique was performed for
global imaging of the pelvis including uterus, ovaries, adnexal
regions, and pelvic cul-de-sac.
It was necessary to proceed with endovaginal exam following the
transabdominal exam to visualize the uterus and ovaries in better
detail. Color and duplex Doppler ultrasound was utilized to evaluate
blood flow to the ovaries.

[Series 1: us pelvis complete · 0.24mm/px · 13 of 88 slices shown]
[im 1/88]
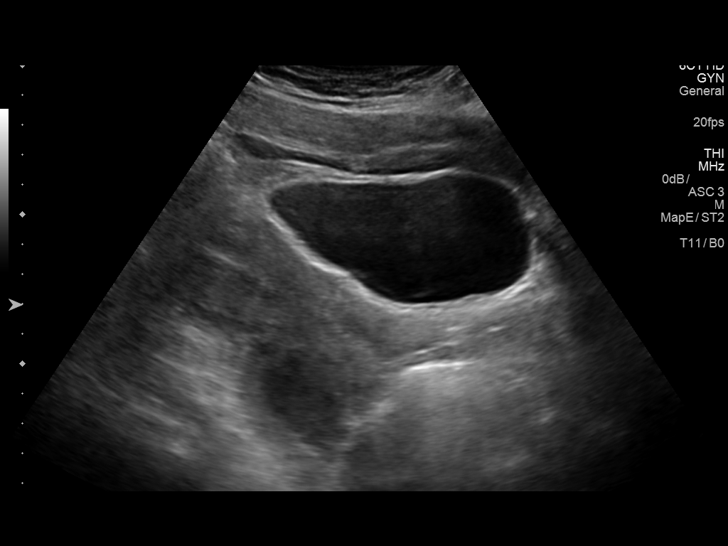
[im 8/88]
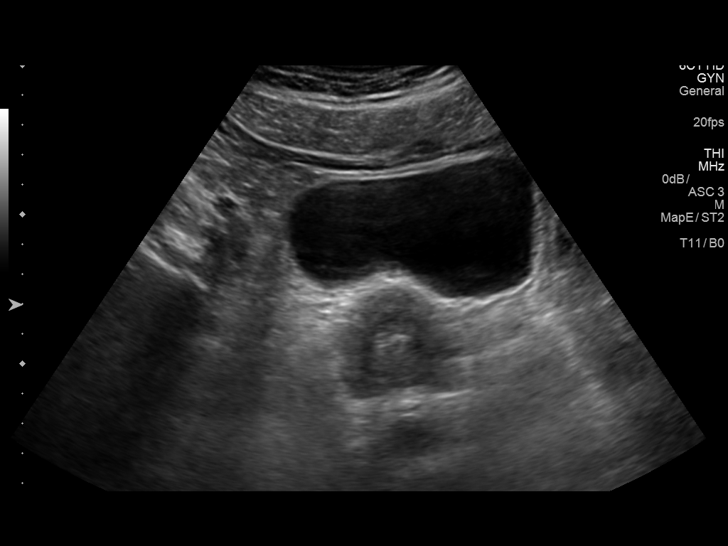
[im 15/88]
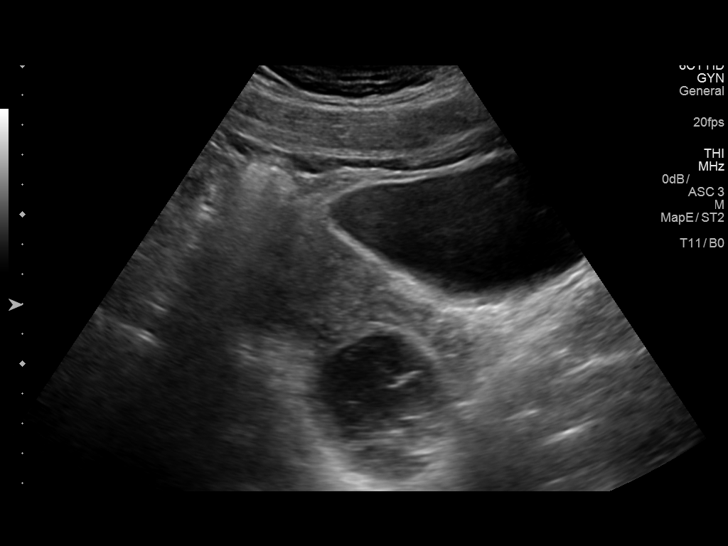
[im 22/88]
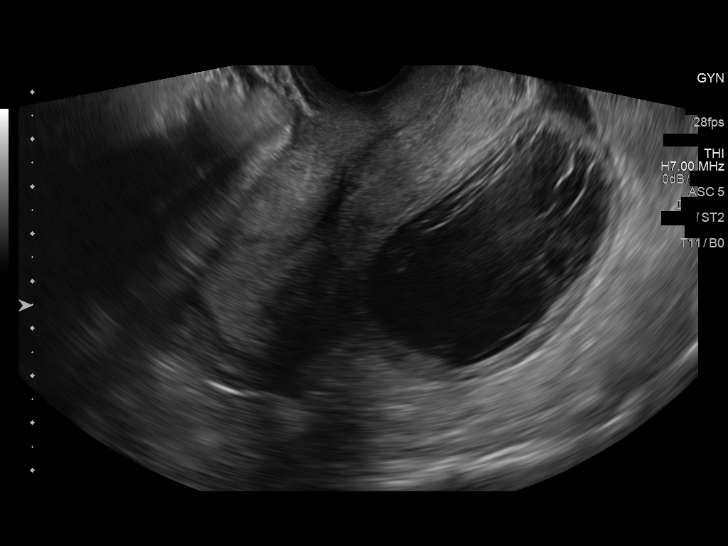
[im 30/88]
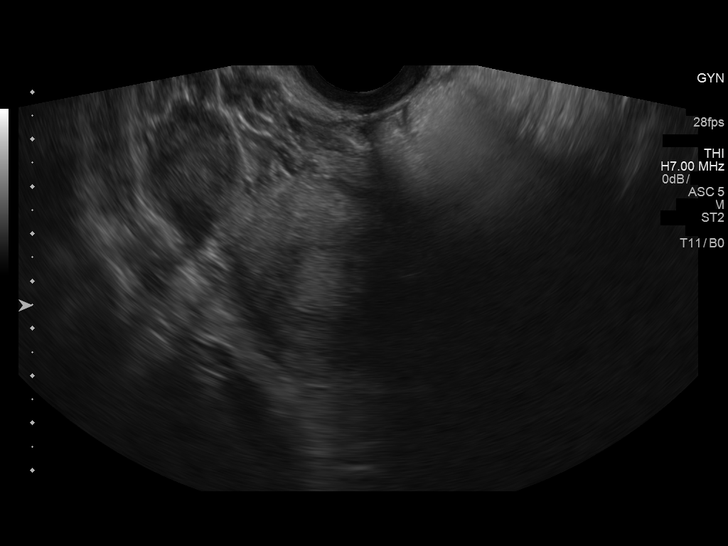
[im 37/88]
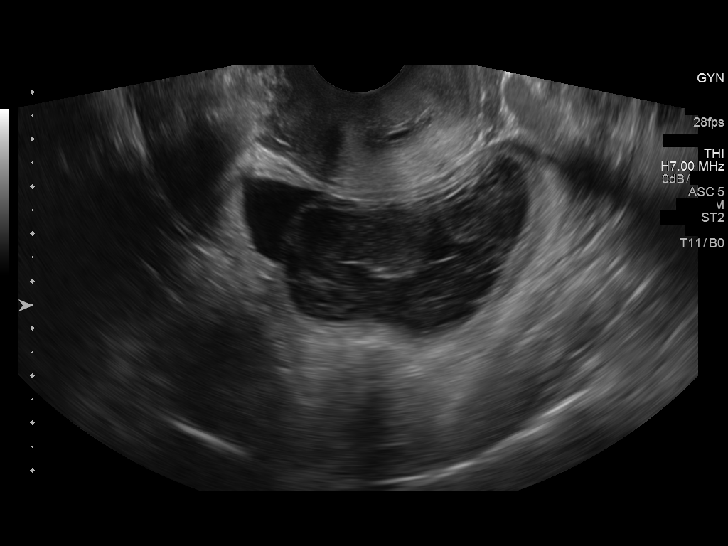
[im 44/88]
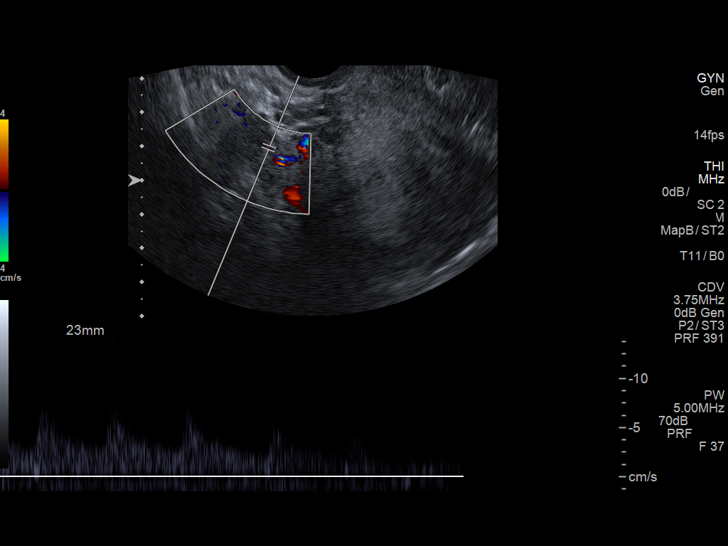
[im 51/88]
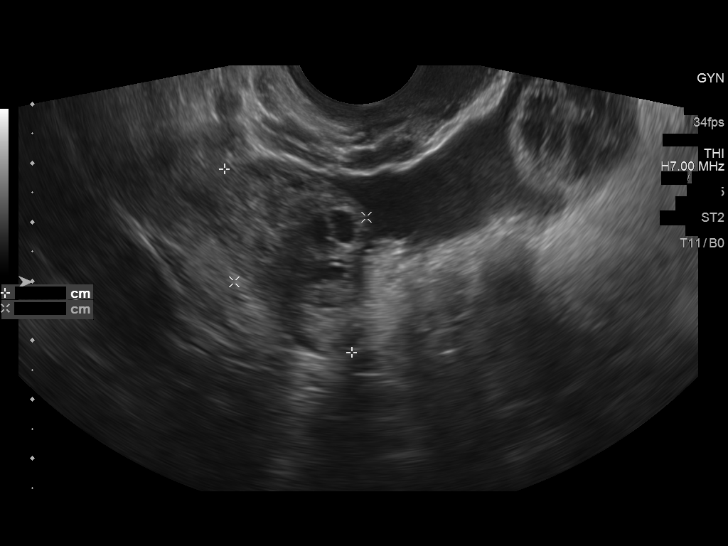
[im 59/88]
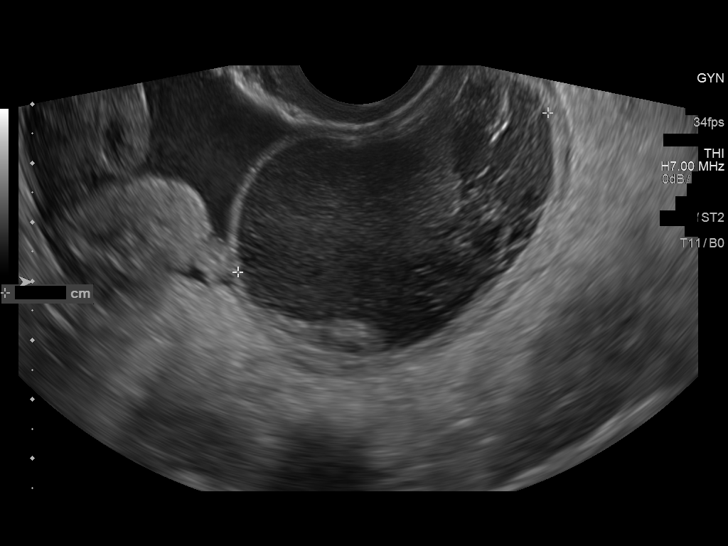
[im 66/88]
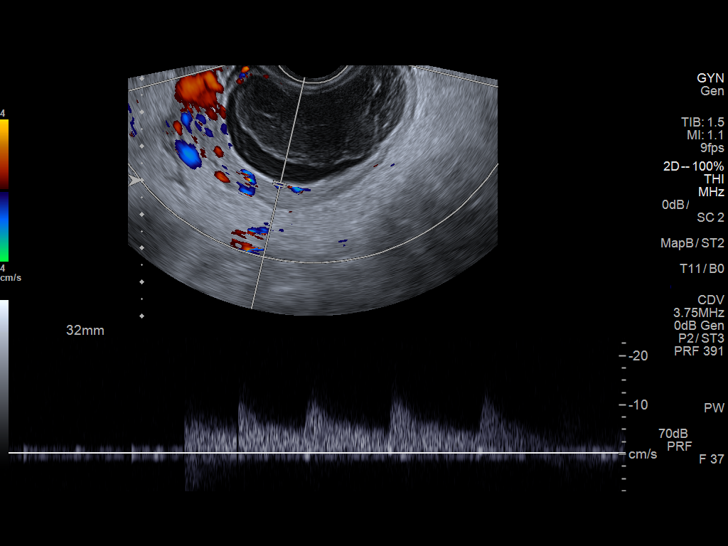
[im 73/88]
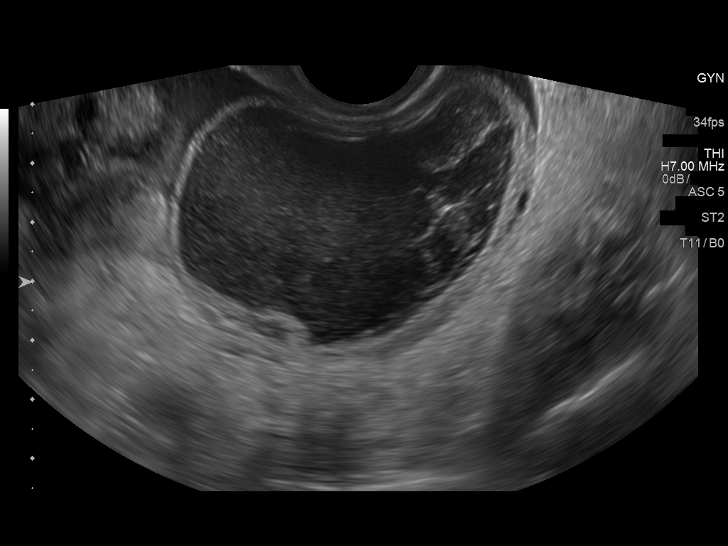
[im 80/88]
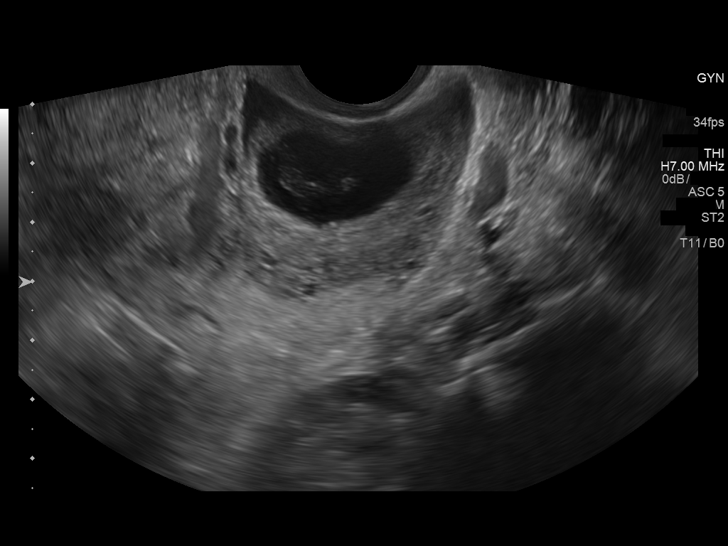
[im 88/88]
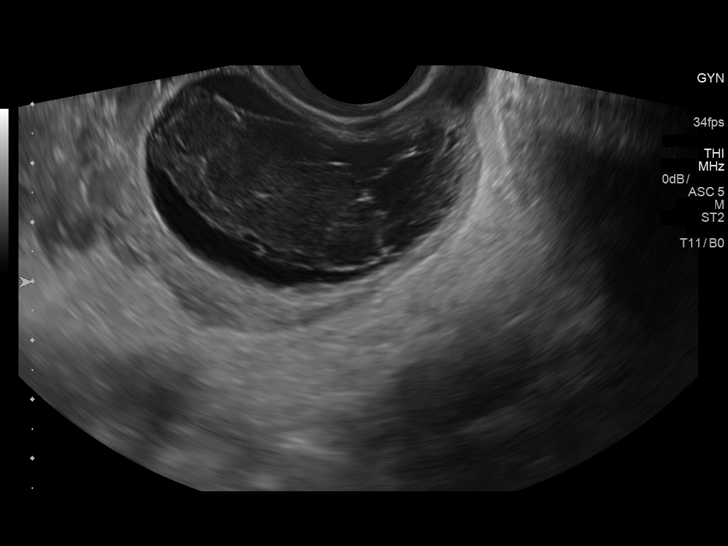

[13 of 25 positions shown; findings below may reference images not displayed]

FINDINGS: Uterus

Measurements: 9.7 x 5.1 x 4.0 cm. No fibroids or other mass
visualized.

Endometrium

Thickness: 15.5 mm.  No focal abnormality visualized.

Right ovary

Measurements: 3.8 x 2.5 x 1.9 cm. Normal appearance/no adnexal mass.

Left ovary

Measurements: 6.6 x 6.8 x 5.2 cm. Large, oval, circumscribed cyst
containing diffuse internal echoes and thin internal septations
without internal blood flow. This measures 5.9 x 5.9 x 4.5 cm.

Pulsed Doppler evaluation of both ovaries demonstrates normal
low-resistance arterial and venous waveforms.

Other findings

Small amount of free peritoneal fluid containing low level internal
echoes.
IMPRESSION: 1. Interval 6.8 cm complicated left ovarian cyst. This is most
likely a hemorrhagic cyst. Other possibilities include an infected
cyst and endometrioma.
2. Small amount of free peritoneal fluid containing low level
internal echoes. This could represent hemorrhagic or infected fluid.
3. Normal appearing uterus and right ovary.
4. No evidence of ovarian torsion.

## 2016-11-19 ENCOUNTER — Ambulatory Visit: Payer: Self-pay | Admitting: Family Medicine

## 2016-11-21 ENCOUNTER — Encounter: Payer: Self-pay | Admitting: Internal Medicine

## 2016-11-21 ENCOUNTER — Ambulatory Visit: Payer: Self-pay | Attending: Family Medicine | Admitting: Internal Medicine

## 2016-11-21 VITALS — BP 129/89 | HR 76 | Temp 98.9°F | Resp 16 | Wt 225.4 lb

## 2016-11-21 DIAGNOSIS — J45909 Unspecified asthma, uncomplicated: Secondary | ICD-10-CM | POA: Insufficient documentation

## 2016-11-21 DIAGNOSIS — F17210 Nicotine dependence, cigarettes, uncomplicated: Secondary | ICD-10-CM | POA: Insufficient documentation

## 2016-11-21 DIAGNOSIS — G4700 Insomnia, unspecified: Secondary | ICD-10-CM | POA: Insufficient documentation

## 2016-11-21 DIAGNOSIS — E669 Obesity, unspecified: Secondary | ICD-10-CM | POA: Insufficient documentation

## 2016-11-21 DIAGNOSIS — Z6836 Body mass index (BMI) 36.0-36.9, adult: Secondary | ICD-10-CM | POA: Insufficient documentation

## 2016-11-21 MED ORDER — FLUTICASONE FUROATE-VILANTEROL 100-25 MCG/INH IN AEPB: INHALATION_SPRAY | RESPIRATORY_TRACT | 3 refills | Status: DC

## 2016-11-21 MED ORDER — ALBUTEROL SULFATE HFA 108 (90 BASE) MCG/ACT IN AERS: 2.0000 | INHALATION_SPRAY | RESPIRATORY_TRACT | 11 refills | Status: DC | PRN

## 2016-11-21 MED FILL — $BREO ELLIPTA 100-25 MCG IH: 100-25 MCG | 30 days supply | Qty: 60 | Fill #4

## 2016-11-21 NOTE — Progress Notes (Signed)
Patient ID: Catherine Porter, female    DOB: 09/23/1990  MRN: [id number]  CC: Establish Care; Asthma; and Referral (pulmonogist)   Subjective: Catherine Porter is a 26 y.o. female who presents for f/u on asthma Her concerns today include:  Asthma: -needing RF on Ventolin -She wonders whether she needs referral to a pulmonologist to confirm diagnosis of asthma. She states that sometimes when she is seen in the emergency room they have questioned whether she has asthma.  -started 5 yrs ago after birth of her son with cough and SOB at nights.  Symptoms were worse during seasonn/weather changes  hosp in past for 3 days.  Never intubated.  Never had PFTs. Under good control for past 3 yrs.  initially on Symbicort and Ventolin but now on Breo and Ventolin as needed.   -Only time she has to use albuterol every 4 hours as when she runs out of the [place]. -smoke socially.   Not exercising as much as she use to.  Use to go to gym several times a wk. -appetite down x 3 wks  Problems falling asleep x 1 wk -mom recently got immigration orders to leave country or be deported. Patient is stressed about this. -normally in bed at 10 p.m but "I start rolling around and grab the phone."  -Denies drinking alcoholic or caffeinated beverages at nights  Patient Active Problem List   Diagnosis Date Noted  . Pelvic pain in female 07/17/2015  . Persistent asthma 03/30/2015     No current outpatient prescriptions on file prior to visit.   No current facility-administered medications on file prior to visit.     No Known Allergies  Social History   Social History  . Marital status: Married    Spouse name: N/A  . Number of children: N/A  . Years of education: N/A   Occupational History  . Not on file.   Social History Main Topics  . Smoking status: Former Smoker    Packs/day: 0.00    Years: 4.00    Types: Cigarettes    Quit date: 10/06/2013  . Smokeless tobacco: Never Used  . Alcohol use Yes    . Drug use: No  . Sexual activity: Yes    Birth control/ protection: IUD     Comment: IUD 2 years   Other Topics Concern  . Not on file   Social History Narrative  . No narrative on file    Family History  Problem Relation Age of Onset  . Anesthesia problems Neg Hx   . Asthma Neg Hx     Past Surgical History:  Procedure Laterality Date  . LAPAROSCOPIC CHOLECYSTECTOMY  2014  . TONSILLECTOMY AND ADENOIDECTOMY  ~ 2008  . TONSILLECTOMY/ADENOIDECTOMY/TURBINATE REDUCTION  ~ 2008    ROS: Review of Systems As above PHYSICAL EXAM: BP 129/89   Pulse 76   Temp 98.9 F (37.2 C) (Oral)   Resp 16   Wt 225 lb 6.4 oz (102.2 kg)   SpO2 95%   BMI 36.38 kg/m   Wt Readings from Last 3 Encounters:  11/21/16 225 lb 6.4 oz (102.2 kg)  07/17/15 212 lb (96.2 kg)  06/20/15 210 lb 15.7 oz (95.7 kg)    Physical Exam  General appearance - alert, well appearing, obese female and in no distress Mental status - alert, oriented to person, place, and time, normal mood, behavior, speech, dress, motor activity, and thought processes Mouth - mucous membranes moist, pharynx normal without lesions Neck -  supple, no significant adenopathy Chest - clear to auscultation, no wheezes, rales or rhonchi, symmetric air entry Heart - normal rate, regular rhythm, normal S1, S2, no murmurs, rubs, clicks or gallops Extremities - peripheral pulses normal, no pedal edema, no clubbing or cyanosis   Depression screen PHQ 2/9 11/21/2016  Decreased Interest 0  Down, Depressed, Hopeless 2  PHQ - 2 Score 2  Altered sleeping 1  Tired, decreased energy 1  Change in appetite 2  Feeling bad or failure about yourself  2  Trouble concentrating 0  Moving slowly or fidgety/restless 0  Suicidal thoughts 0  PHQ-9 Score 8   GAD 7 : Generalized Anxiety Score 11/21/2016 07/17/2015  Nervous, Anxious, on Edge 0 3  Control/stop worrying 0 3  Worry too much - different things 2 3  Trouble relaxing 2 2  Restless 0 3   Easily annoyed or irritable 2 2  Afraid - awful might happen 2 0  Total GAD 7 Score 8 16     ASSESSMENT AND PLAN: 1. Persistent asthma without complication, unspecified asthma severity -I informed patient that her symptoms really do sound like asthma. Given that symptoms are under control I do not see the need for referral to a pulmonologist at this time. -I advised against smoking socially - albuterol (VENTOLIN HFA) 108 (90 Base) MCG/ACT inhaler; Inhale 2 puffs into the lungs every 4 (four) hours as needed for wheezing or shortness of breath.  Dispense: 54 g; Refill: 11 - fluticasone furoate-vilanterol (BREO ELLIPTA) 100-25 MCG/INH AEPB; INHALE 1 PUFF INTO THE LUNGS DAILY.  Dispense: 180 each; Refill: 3  2. Insomnia, unspecified type -Discussed good sleep hygiene including getting in bed around the same time every night and turning off or lights and sounds. Recent change in sleep pattern most likely due to stress and thought of her mother being deported.  3. Obesity (BMI 35.0-39.9 without comorbidity) Advised healthy eating and regular exercise. We discussed ways to incorporate exercise into her daily routine given that she has 2 young children. Patient advised to use Ventolin inhaler prior to exercise if in the past she got short of breath with exercise.  Patient was given the opportunity to ask questions.  Patient verbalized understanding of the plan and was able to repeat key elements of the plan.   No orders of the defined types were placed in this encounter.    Requested Prescriptions   Signed Prescriptions Disp Refills  . albuterol (VENTOLIN HFA) 108 (90 Base) MCG/ACT inhaler 54 g 11    Sig: Inhale 2 puffs into the lungs every 4 (four) hours as needed for wheezing or shortness of breath.  . fluticasone furoate-vilanterol (BREO ELLIPTA) 100-25 MCG/INH AEPB 180 each 3    Sig: INHALE 1 PUFF INTO THE LUNGS DAILY.    Return in about 6 months (around 05/23/2017).  Catherine Blue, MD, FACP

## 2016-11-21 NOTE — Patient Instructions (Signed)
Continue your inhalers as prescribed. I would encourage you to not smoke at all as discussed.  I recommend regular aerobic exercise 3-4 times a week for 30 minutes..  Use your albuterol inhaler before exercise if you get short of breath during exercise in the past..  Insomnia Insomnia is a sleep disorder that makes it difficult to fall asleep or to stay asleep. Insomnia can cause tiredness (fatigue), low energy, difficulty concentrating, mood swings, and poor performance at work or school. There are three different ways to classify insomnia:  Difficulty falling asleep.  Difficulty staying asleep.  Waking up too early in the morning.  Any type of insomnia can be long-term (chronic) or short-term (acute). Both are common. Short-term insomnia usually lasts for three months or less. Chronic insomnia occurs at least three times a week for longer than three months. What are the causes? Insomnia may be caused by another condition, situation, or substance, such as:  Anxiety.  Certain medicines.  Gastroesophageal reflux disease (GERD) or other gastrointestinal conditions.  Asthma or other breathing conditions.  Restless legs syndrome, sleep apnea, or other sleep disorders.  Chronic pain.  Menopause. This may include hot flashes.  Stroke.  Abuse of alcohol, tobacco, or illegal drugs.  Depression.  Caffeine.  Neurological disorders, such as Alzheimer disease.  An overactive thyroid (hyperthyroidism).  The cause of insomnia may not be known. What increases the risk? Risk factors for insomnia include:  Gender. Women are more commonly affected than men.  Age. Insomnia is more common as you get older.  Stress. This may involve your professional or personal life.  Income. Insomnia is more common in people with lower income.  Lack of exercise.  Irregular work schedule or night shifts.  Traveling between different time zones.  What are the signs or symptoms? If you have  insomnia, trouble falling asleep or trouble staying asleep is the main symptom. This may lead to other symptoms, such as:  Feeling fatigued.  Feeling nervous about going to sleep.  Not feeling rested in the morning.  Having trouble concentrating.  Feeling irritable, anxious, or depressed.  How is this treated? Treatment for insomnia depends on the cause. If your insomnia is caused by an underlying condition, treatment will focus on addressing the condition. Treatment may also include:  Medicines to help you sleep.  Counseling or therapy.  Lifestyle adjustments.  Follow these instructions at home:  Take medicines only as directed by your health care provider.  Keep regular sleeping and waking hours. Avoid naps.  Keep a sleep diary to help you and your health care provider figure out what could be causing your insomnia. Include: ? When you sleep. ? When you wake up during the night. ? How well you sleep. ? How rested you feel the next day. ? Any side effects of medicines you are taking. ? What you eat and drink.  Make your bedroom a comfortable place where it is easy to fall asleep: ? Put up shades or special blackout curtains to block light from outside. ? Use a white noise machine to block noise. ? Keep the temperature cool.  Exercise regularly as directed by your health care provider. Avoid exercising right before bedtime.  Use relaxation techniques to manage stress. Ask your health care provider to suggest some techniques that may work well for you. These may include: ? Breathing exercises. ? Routines to release muscle tension. ? Visualizing peaceful scenes.  Cut back on alcohol, caffeinated beverages, and cigarettes, especially close to bedtime.  These can disrupt your sleep.  Do not overeat or eat spicy foods right before bedtime. This can lead to digestive discomfort that can make it hard for you to sleep.  Limit screen use before bedtime. This  includes: ? Watching TV. ? Using your smartphone, tablet, and computer.  Stick to a routine. This can help you fall asleep faster. Try to do a quiet activity, brush your teeth, and go to bed at the same time each night.  Get out of bed if you are still awake after 15 minutes of trying to sleep. Keep the lights down, but try reading or doing a quiet activity. When you feel sleepy, go back to bed.  Make sure that you drive carefully. Avoid driving if you feel very sleepy.  Keep all follow-up appointments as directed by your health care provider. This is important. Contact a health care provider if:  You are tired throughout the day or have trouble in your daily routine due to sleepiness.  You continue to have sleep problems or your sleep problems get worse. Get help right away if:  You have serious thoughts about hurting yourself or someone else. This information is not intended to replace advice given to you by your health care provider. Make sure you discuss any questions you have with your health care provider. Document Released: 05/24/2000 Document Revised: 10/27/2015 Document Reviewed: 02/25/2014 Elsevier Interactive Patient Education  [employer]2018 [employer]

## 2016-11-25 MED FILL — !VENTOLIN HFA INHALER: 108 (90 BAS | 25 days supply | Qty: 18 | Fill #0

## 2016-12-23 MED FILL — !VENTOLIN HFA INHALER: 108 (90 BAS | 25 days supply | Qty: 18 | Fill #1

## 2016-12-24 MED FILL — !BREO ELLIPTA 100-25 MCG IN: 100-25 | 30 days supply | Qty: 60 | Fill #5

## 2017-01-18 IMAGING — CR DG CHEST 2V
2 series · 2 of 2 positions shown · non-contrast
Comparison: None.

CLINICAL DATA: Chest pain and cough

EXAM:
CHEST  2 VIEW

[w chest pa]
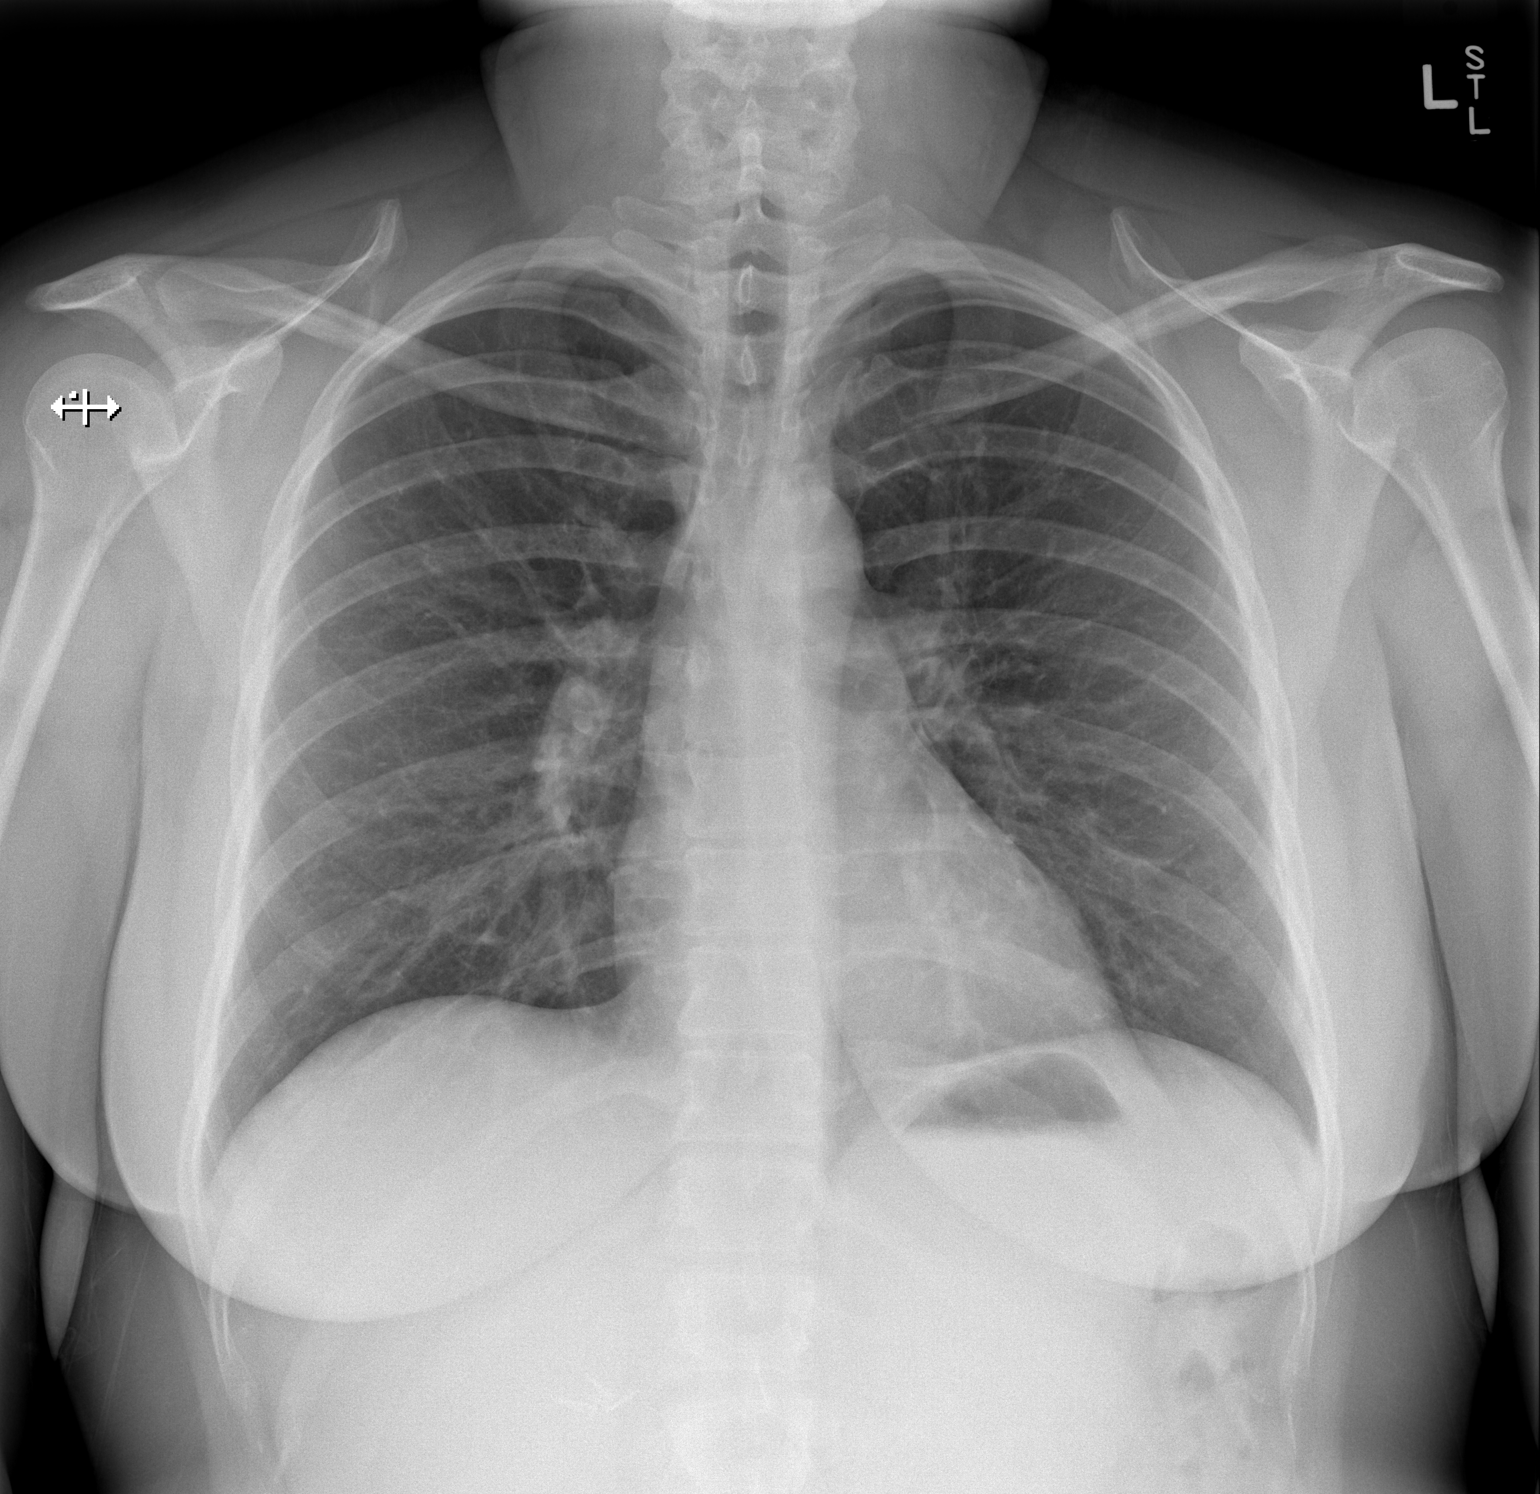

[w chest lat]
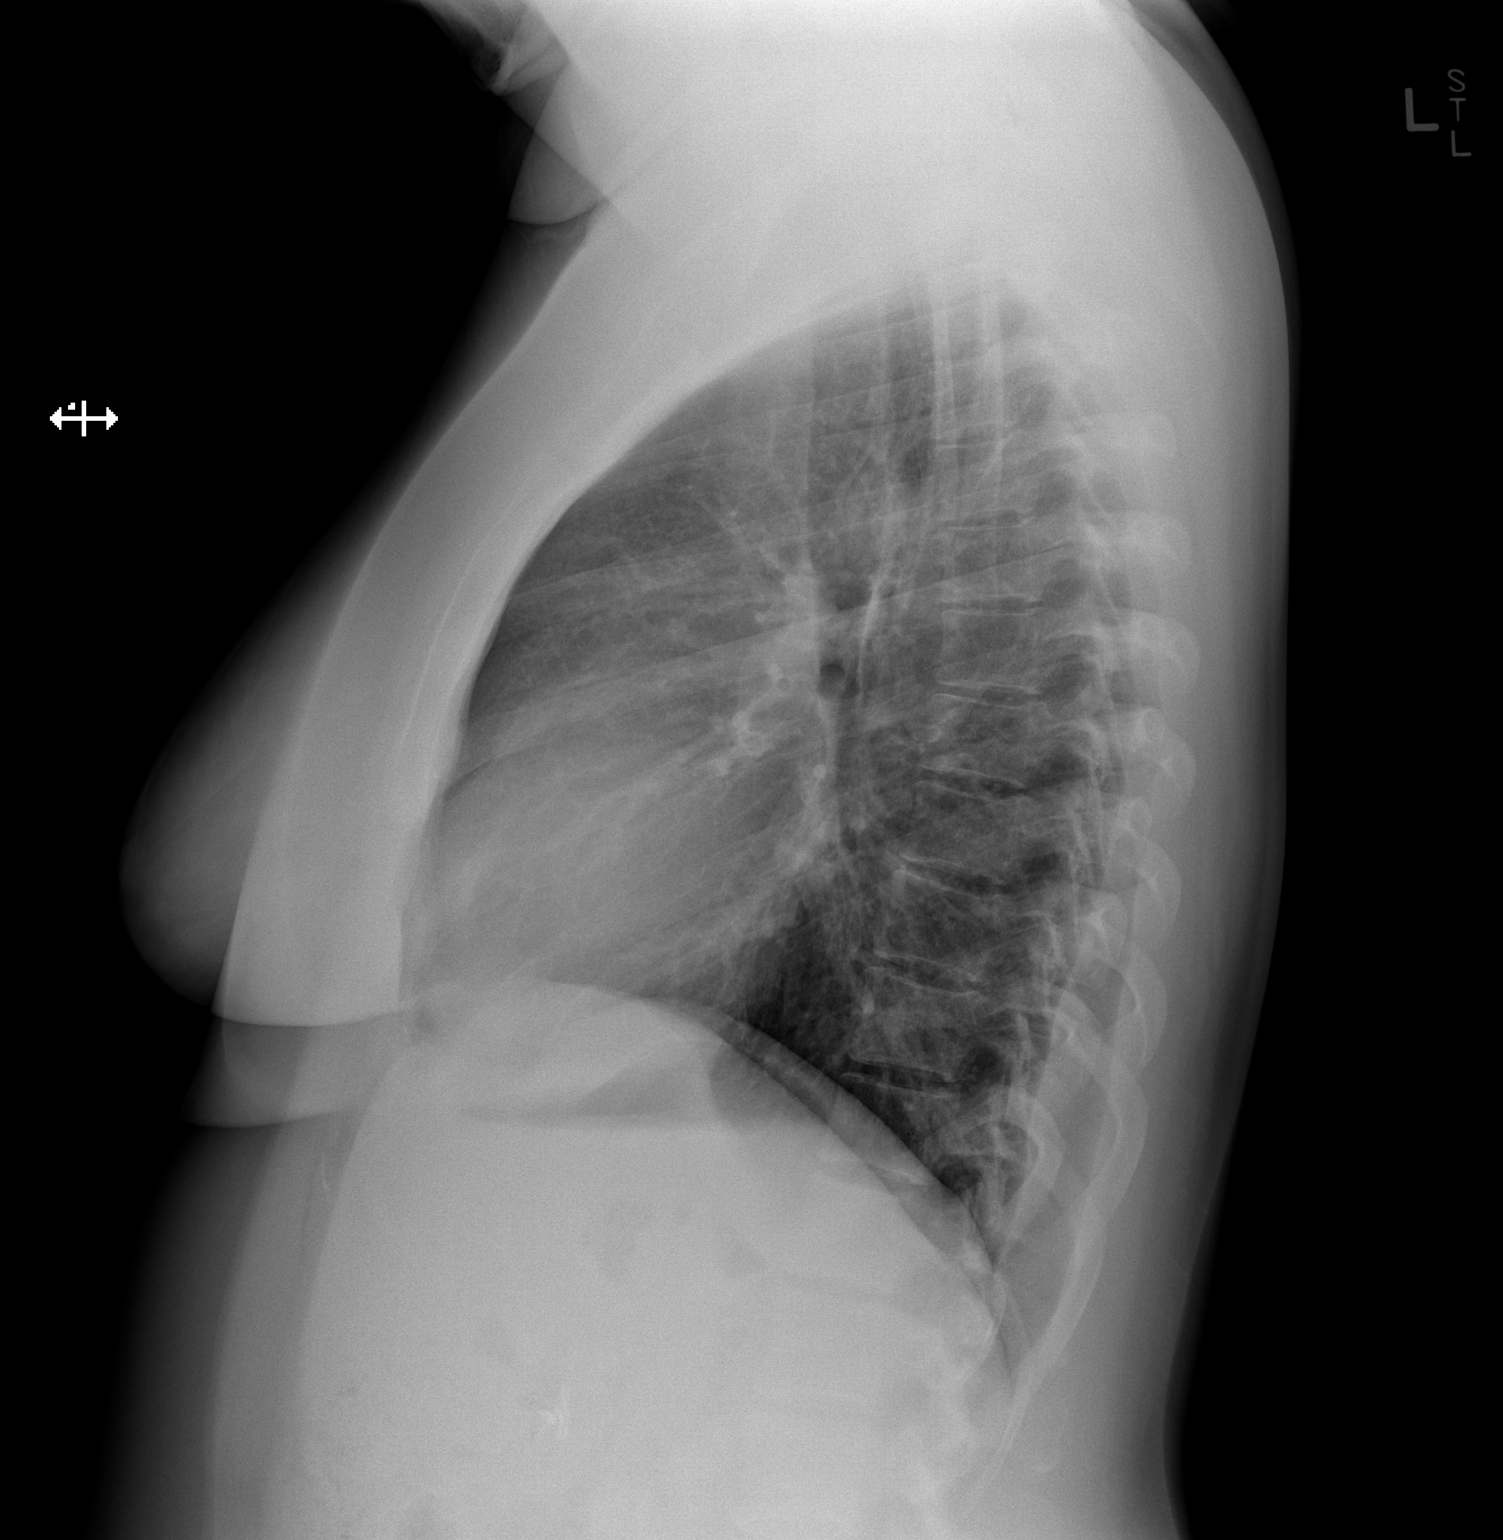

[2 of 2 positions shown; findings below may reference images not displayed]

FINDINGS: The heart size and mediastinal contours are within normal limits.
Both lungs are clear. The visualized skeletal structures are
unremarkable.
IMPRESSION: No active cardiopulmonary disease.

## 2017-01-27 IMAGING — DX DG CHEST 2V
2 series · 2 of 2 positions shown · non-contrast
Comparison: Chest radiograph dated 06/11/2015

CLINICAL DATA: 24-year-old female with shortness of breath and
cough

EXAM:
CHEST  2 VIEW

[chest pa]
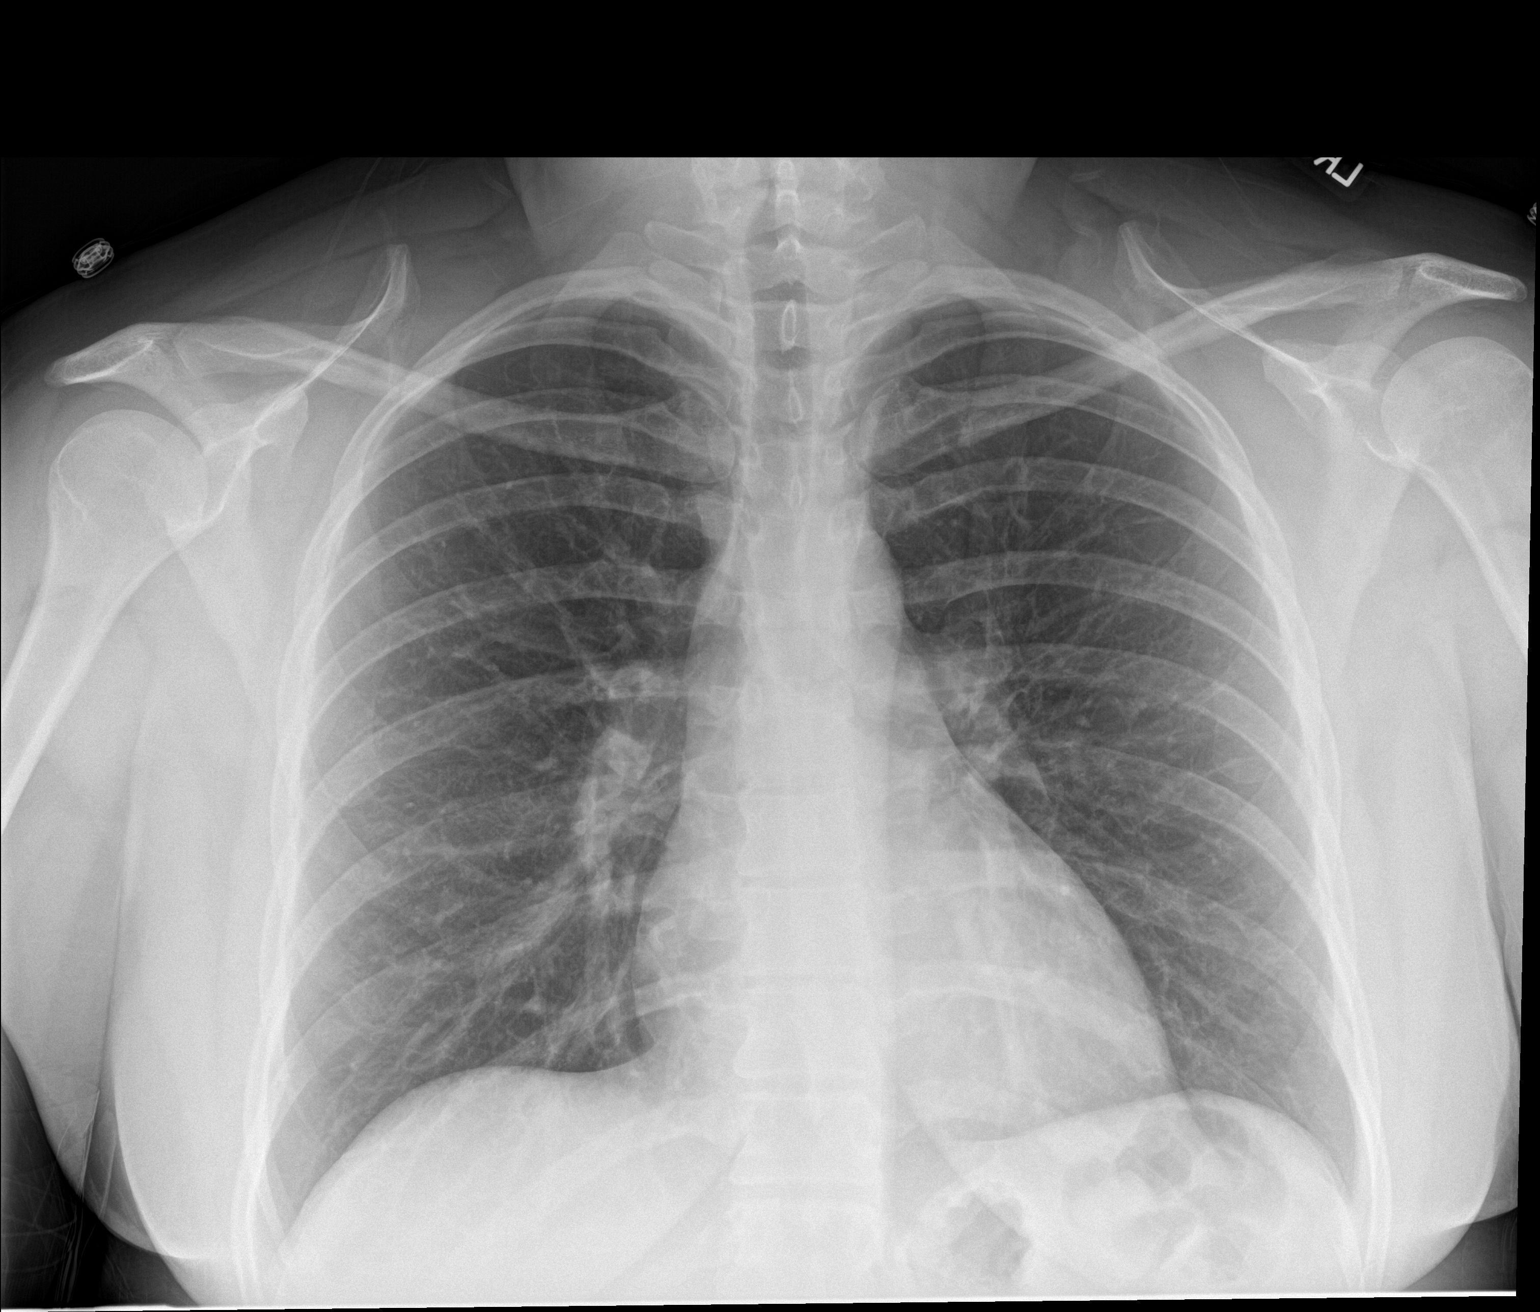

[chest lat]
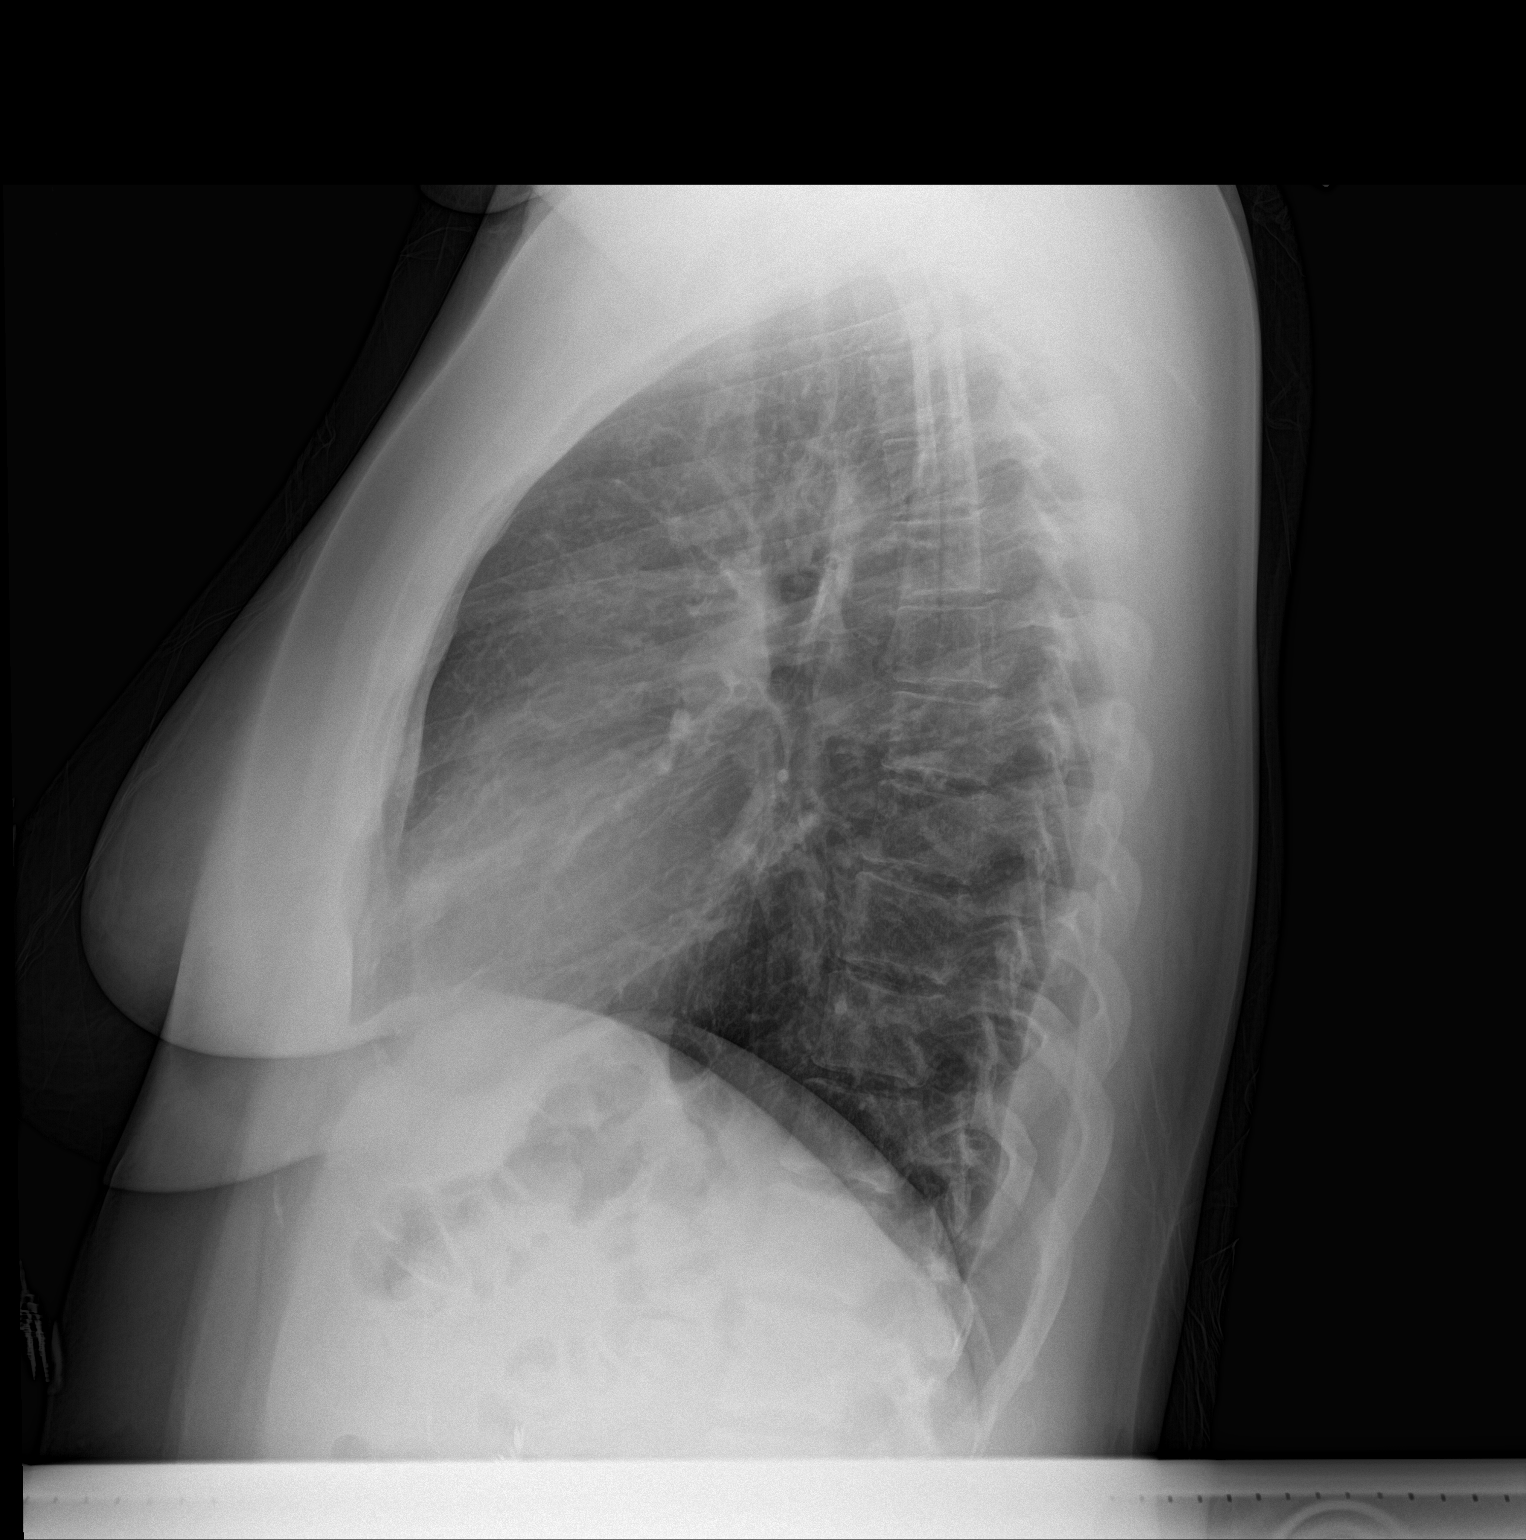

[2 of 2 positions shown; findings below may reference images not displayed]

FINDINGS: The heart size and mediastinal contours are within normal limits.
Both lungs are clear. The visualized skeletal structures are
unremarkable.
IMPRESSION: No active cardiopulmonary disease.

## 2017-02-17 MED FILL — **BREO ELLIPTA 100-25 MCG I: 100-25MCG | 30 days supply | Qty: 60 | Fill #6

## 2017-02-17 MED FILL — !VENTOLIN HFA INHALER: 108 (90 BAS | 16 days supply | Qty: 18 | Fill #2

## 2017-03-11 MED FILL — !VENTOLIN HFA INHALER: 108 (90 BAS | 16 days supply | Qty: 18 | Fill #3

## 2017-03-11 MED FILL — **BREO ELLIPTA 100-25 MCG I: 100-25MCG | 30 days supply | Qty: 60 | Fill #7

## 2017-03-20 ENCOUNTER — Encounter (HOSPITAL_COMMUNITY): Payer: Self-pay | Admitting: *Deleted

## 2017-03-20 DIAGNOSIS — Y92410 Unspecified street and highway as the place of occurrence of the external cause: Secondary | ICD-10-CM | POA: Diagnosis not present

## 2017-03-20 DIAGNOSIS — T07XXXA Unspecified multiple injuries, initial encounter: Secondary | ICD-10-CM | POA: Diagnosis not present

## 2017-03-20 DIAGNOSIS — J45909 Unspecified asthma, uncomplicated: Secondary | ICD-10-CM | POA: Insufficient documentation

## 2017-03-20 DIAGNOSIS — Z87891 Personal history of nicotine dependence: Secondary | ICD-10-CM | POA: Diagnosis not present

## 2017-03-20 DIAGNOSIS — Y999 Unspecified external cause status: Secondary | ICD-10-CM | POA: Insufficient documentation

## 2017-03-20 DIAGNOSIS — Z23 Encounter for immunization: Secondary | ICD-10-CM | POA: Diagnosis not present

## 2017-03-20 DIAGNOSIS — Y939 Activity, unspecified: Secondary | ICD-10-CM | POA: Insufficient documentation

## 2017-03-20 DIAGNOSIS — Z79899 Other long term (current) drug therapy: Secondary | ICD-10-CM | POA: Diagnosis not present

## 2017-03-20 DIAGNOSIS — S40812A Abrasion of left upper arm, initial encounter: Secondary | ICD-10-CM | POA: Diagnosis not present

## 2017-03-20 DIAGNOSIS — M25552 Pain in left hip: Secondary | ICD-10-CM | POA: Insufficient documentation
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
## 2017-03-21 ENCOUNTER — Emergency Department (HOSPITAL_COMMUNITY)
Admission: EM | Admit: 2017-03-21 | Discharge: 2017-03-21 | Disposition: A | Discharge status: Home / Self Care | Payer: No Typology Code available for payment source | Attending: Emergency Medicine | Admitting: Emergency Medicine

## 2017-03-21 DIAGNOSIS — M25552 Pain in left hip: Secondary | ICD-10-CM

## 2017-03-21 DIAGNOSIS — T07XXXA Unspecified multiple injuries, initial encounter: Secondary | ICD-10-CM

## 2017-03-21 MED ORDER — METHOCARBAMOL 500 MG PO TABS: 500.0000 mg | ORAL_TABLET | Freq: Two times a day (BID) | ORAL | 0 refills | Status: DC

## 2017-03-21 MED ORDER — TETANUS-DIPHTH-ACELL PERTUSSIS 5-2.5-18.5 LF-MCG/0.5 IM SUSP
0.5000 mL | Freq: Once | INTRAMUSCULAR | Status: AC
  Administered 2017-03-21: 0.5 mL via INTRAMUSCULAR
  Filled 2017-03-21: qty 0.5

## 2017-03-21 MED ORDER — NAPROXEN 500 MG PO TABS: 500.0000 mg | ORAL_TABLET | Freq: Two times a day (BID) | ORAL | 0 refills | Status: DC
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
## 2017-03-21 NOTE — Discharge Instructions (Signed)

## 2017-04-15 MED FILL — **BREO ELLIPTA 100-25 MCG I: 100-25MCG | 30 days supply | Qty: 60 | Fill #8

## 2017-04-15 MED FILL — !VENTOLIN HFA INHALER: 108 (90 BAS | 16 days supply | Qty: 18 | Fill #4

## 2017-06-04 MED FILL — !VENTOLIN HFA INHALER: 108 (90 BAS | 16 days supply | Qty: 18 | Fill #5

## 2017-06-09 ENCOUNTER — Other Ambulatory Visit: Payer: Self-pay | Admitting: Pharmacist

## 2017-06-09 DIAGNOSIS — J45909 Unspecified asthma, uncomplicated: Secondary | ICD-10-CM

## 2017-06-09 MED ORDER — FLUTICASONE FUROATE-VILANTEROL 100-25 MCG/INH IN AEPB: INHALATION_SPRAY | RESPIRATORY_TRACT | 0 refills | Status: DC

## 2017-06-16 MED FILL — !VENTOLIN HFA INHALER: 108 (90 BAS | 16 days supply | Qty: 18 | Fill #6

## 2017-06-17 ENCOUNTER — Emergency Department (HOSPITAL_COMMUNITY)
Admission: EM | Admit: 2017-06-17 | Discharge: 2017-06-17 | Disposition: A | Discharge status: Home / Self Care | Payer: Self-pay | Attending: Emergency Medicine | Admitting: Emergency Medicine

## 2017-06-17 ENCOUNTER — Other Ambulatory Visit: Payer: Self-pay

## 2017-06-17 ENCOUNTER — Encounter (HOSPITAL_COMMUNITY): Payer: Self-pay | Admitting: Emergency Medicine

## 2017-06-17 ENCOUNTER — Other Ambulatory Visit: Payer: Self-pay | Admitting: Pharmacist

## 2017-06-17 DIAGNOSIS — Z87891 Personal history of nicotine dependence: Secondary | ICD-10-CM | POA: Insufficient documentation

## 2017-06-17 DIAGNOSIS — J45909 Unspecified asthma, uncomplicated: Secondary | ICD-10-CM

## 2017-06-17 DIAGNOSIS — Z79899 Other long term (current) drug therapy: Secondary | ICD-10-CM | POA: Insufficient documentation

## 2017-06-17 DIAGNOSIS — J4521 Mild intermittent asthma with (acute) exacerbation: Secondary | ICD-10-CM | POA: Insufficient documentation

## 2017-06-17 MED ORDER — IPRATROPIUM-ALBUTEROL 0.5-2.5 (3) MG/3ML IN SOLN
3.0000 mL | Freq: Once | RESPIRATORY_TRACT | Status: AC
  Administered 2017-06-17: 3 mL via RESPIRATORY_TRACT
  Filled 2017-06-17: qty 3

## 2017-06-17 MED ORDER — PREDNISONE 10 MG PO TABS: 20.0000 mg | ORAL_TABLET | Freq: Two times a day (BID) | ORAL | 0 refills | Status: AC

## 2017-06-17 MED ORDER — ALBUTEROL SULFATE HFA 108 (90 BASE) MCG/ACT IN AERS
1.0000 | INHALATION_SPRAY | RESPIRATORY_TRACT | Status: DC | PRN
  Filled 2017-06-17: qty 6.7

## 2017-06-17 MED ORDER — FLUTICASONE FUROATE-VILANTEROL 100-25 MCG/INH IN AEPB
INHALATION_SPRAY | RESPIRATORY_TRACT | 0 refills | Status: DC
Start: 2017-06-17 — End: 2017-06-19

## 2017-06-17 MED ORDER — PREDNISONE 20 MG PO TABS
60.0000 mg | ORAL_TABLET | Freq: Once | ORAL | Status: AC
  Administered 2017-06-17: 60 mg via ORAL
  Filled 2017-06-17: qty 3

## 2017-06-17 MED ORDER — FLUTICASONE FUROATE-VILANTEROL 100-25 MCG/INH IN AEPB: INHALATION_SPRAY | RESPIRATORY_TRACT | 0 refills | Status: DC

## 2017-06-17 NOTE — ED Notes (Signed)
Pt had steady gait, while ambulating to the bathroom. 

## 2017-06-17 NOTE — ED Provider Notes (Signed)
[facility] EMERGENCY DEPARTMENT Provider Note   CSN: [id number] Arrival date & time: 06/17/17  1523     History   Chief Complaint Chief Complaint  Patient presents with  . Asthma    HPI Catherine Porter is a 27 y.o. female with a history of asthma who presents to the emergency department today for asthma exacerbation.  Patient is currently on Breo and albuterol for her asthma. She recently ran out of the [facility] and albuterol yesterday morning.  Last night she started having the onset of wheezing, shortness of breath and chest tightness that she feels is the same as her her typical asthma exacerbation.  She has not tried any interventions for this.  She denies any preceding URI symptoms. No fever, cough, chest pain. Patient notes last admission for asthma was in January 2017. No history of intubations for asthma.   HPI  Past Medical History:  Diagnosis Date  . Arthritis    "fingers" (06/20/2015)  . Asthma   . Gestational HTN 2012   when pregnant with son   . Migraine    "not often anymore" (06/20/2015)    Patient Active Problem List   Diagnosis Date Noted  . Pelvic pain in female 07/17/2015  . Persistent asthma 03/30/2015    Past Surgical History:  Procedure Laterality Date  . LAPAROSCOPIC CHOLECYSTECTOMY  2014  . TONSILLECTOMY AND ADENOIDECTOMY  ~ 2008  . TONSILLECTOMY/ADENOIDECTOMY/TURBINATE REDUCTION  ~ 2008    OB History    Gravida Para Term Preterm AB Living   2 2 2  0 0 2   SAB TAB Ectopic Multiple Live Births   0 0 0 0 2      Obstetric Comments   2 children, one boy and girl       Home Medications    Prior to Admission medications   Medication Sig Start Date End Date Taking? Authorizing Provider  albuterol (VENTOLIN HFA) 108 (90 Base) MCG/ACT inhaler Inhale 2 puffs into the lungs every 4 (four) hours as needed for wheezing or shortness of breath. 11/21/16   Marcine Matar, MD  fluticasone furoate-vilanterol (BREO ELLIPTA) 100-25  MCG/INH AEPB INHALE 1 PUFF INTO THE LUNGS DAILY. 06/17/17   Marcine Matar, MD  methocarbamol (ROBAXIN) 500 MG tablet Take 1 tablet (500 mg total) by mouth 2 (two) times daily. 03/21/17   Muthersbaugh, Dahlia Client, PA-C  naproxen (NAPROSYN) 500 MG tablet Take 1 tablet (500 mg total) by mouth 2 (two) times daily with a meal. 03/21/17   Muthersbaugh, Dahlia Client, PA-C    Family History Family History  Problem Relation Age of Onset  . Anesthesia problems Neg Hx   . Asthma Neg Hx     Social History Social History   Tobacco Use  . Smoking status: Former Smoker    Packs/day: 0.00    Years: 4.00    Pack years: 0.00    Types: Cigarettes    Last attempt to quit: 10/06/2013    Years since quitting: 3.6  . Smokeless tobacco: Never Used  Substance Use Topics  . Alcohol use: Yes  . Drug use: No     Allergies   Patient has no known allergies.   Review of Systems Review of Systems  All other systems reviewed and are negative.    Physical Exam Updated Vital Signs BP 114/87 (BP Location: Left Arm)   Pulse 88   Temp 98 F (36.7 C) (Oral)   Resp 16   Ht 5\' 6"  (1.676 m)  Wt 102.1 kg (225 lb)   LMP 04/17/2017 (Approximate)   SpO2 98%   BMI 36.32 kg/m   Physical Exam  Constitutional: She appears well-developed and well-nourished. No distress.  HENT:  Head: Normocephalic and atraumatic.  Right Ear: Hearing, tympanic membrane, external ear and ear canal normal. No foreign bodies. Tympanic membrane is not injected, not perforated, not erythematous, not retracted and not bulging.  Left Ear: Hearing, tympanic membrane, external ear and ear canal normal. No foreign bodies. Tympanic membrane is not injected, not perforated, not erythematous, not retracted and not bulging.  Nose: Nose normal. No mucosal edema, rhinorrhea, sinus tenderness, septal deviation or nasal septal hematoma.  No foreign bodies. Right sinus exhibits no maxillary sinus tenderness and no frontal sinus tenderness. Left sinus  exhibits no maxillary sinus tenderness and no frontal sinus tenderness.  Mouth/Throat: Uvula is midline, oropharynx is clear and moist and mucous membranes are normal. No tonsillar exudate.  The patient has normal phonation and is in control of secretions. No stridor.  Midline uvula without edema. Soft palate rises symmetrically.  No tonsillar erythema or exudates. No PTA. Tongue protrusion is normal. No trismus. No creptius on neck palpation and patient has good dentition. No gingival erythema or fluctuance noted. Mucus membranes moist.  Eyes: Conjunctivae, EOM and lids are normal. Pupils are equal, round, and reactive to light. Right eye exhibits no discharge. Left eye exhibits no discharge. Right conjunctiva is not injected. Left conjunctiva is not injected. No scleral icterus.  Neck: Trachea normal, normal range of motion, full passive range of motion without pain and phonation normal. Neck supple. No spinous process tenderness and no muscular tenderness present. No neck rigidity. No tracheal deviation and normal range of motion present.  Cardiovascular: Normal rate, regular rhythm and intact distal pulses.  No murmur heard. Pulses:      Radial pulses are 2+ on the right side, and 2+ on the left side.       Dorsalis pedis pulses are 2+ on the right side, and 2+ on the left side.       Posterior tibial pulses are 2+ on the right side, and 2+ on the left side.  No lower extremity swelling or edema. Calves symmetric in size bilaterally.  Pulmonary/Chest: Effort normal. No stridor. She has wheezes (expiratory). She exhibits no tenderness.  Abdominal: Soft. Bowel sounds are normal. There is no tenderness. There is no rebound and no guarding.  Musculoskeletal: She exhibits no edema.  Lymphadenopathy:       Head (right side): No submental, no submandibular and no tonsillar adenopathy present.       Head (left side): No submental, no submandibular and no tonsillar adenopathy present.    She has no  cervical adenopathy.  Neurological: She is alert.  Skin: Skin is warm and dry. No rash noted. She is not diaphoretic.  Psychiatric: She has a normal mood and affect.  Nursing note and vitals reviewed.    ED Treatments / Results  Labs (all labs ordered are listed, but only abnormal results are displayed) Labs Reviewed - No data to display  EKG  EKG Interpretation None       Radiology No results found.  Procedures Procedures (including critical care time)  Medications Ordered in ED Medications  ipratropium-albuterol (DUONEB) 0.5-2.5 (3) MG/3ML nebulizer solution 3 mL (not administered)  predniSONE (DELTASONE) tablet 60 mg (not administered)  ipratropium-albuterol (DUONEB) 0.5-2.5 (3) MG/3ML nebulizer solution 3 mL (3 mLs Nebulization Given 06/17/17 1550)     Initial  Impression / Assessment and Plan / ED Course  I have reviewed the triage vital signs and the nursing notes.  Pertinent labs & imaging results that were available during my care of the patient were reviewed by me and considered in my medical decision making (see chart for details).     This is a 27 year old female presenting for asthma exacerbation after running out of her home medications yesterday morning.  She is without preceding URI symptoms and is afebrile in the department.  Lung exam with diffuse expiratory wheezes.  No rhonchi or rales.  Do not feel chest x-ray is needed at this time.  Will give patient DuoNeb treatment and reevaluate.  Lung exam improved after nebulizer treatment. No current signs of respiratory distress.  Prednisone given in the ED and pt will be discharged with 5 day burst. Pt states they are breathing at baseline.  Will refill patient's home asthma medications.  Albuterol inhaler given in the department.  Return precautions discussed.  Pt has been instructed to continue using prescribed medications and to speak with PCP about today's exacerbation.   Final Clinical Impressions(s) / ED  Diagnoses   Final diagnoses:  Mild intermittent asthma with exacerbation    ED Discharge Orders        Ordered    fluticasone furoate-vilanterol (BREO ELLIPTA) 100-25 MCG/INH AEPB    Comments:  Must have office visit for refills - last refill   06/17/17 1649    predniSONE (DELTASONE) 10 MG tablet  2 times daily with meals     06/17/17 1649       Jacinto Halim, PA-C 06/17/17 1650    Margarita Grizzle, MD 06/18/17 1558

## 2017-06-17 NOTE — ED Triage Notes (Signed)
Pt to ED with c/o having a asthma attack   Pt st's onset last pm.  St's she is out of her meds and can't get them till tomorrow

## 2017-06-17 NOTE — Discharge Instructions (Signed)
You were seen here today for symptoms related to your known Asthma.   Please follow-up with your doctor in regards to your asthma exacerbation in the next 3 days for further evaluation and management of your asthma. Read the instructions below to learn more about factors that can trigger asthma. Use your albuterol inhaler as directed. Your more than welcome to return to the emergency department if you become short of breath, your albuterol inhaler does not relieve symptoms, you are having chest pain associated with difficulty breathing, or any symptoms that are concerning to you.   Take medications as prescribed.   IDENTIFY AND CONTROL FACTORS THAT MAKE YOUR ASTHMA WORSE A number of common things can set off or make your asthma symptoms worse (asthma triggers). Keep track of your asthma symptoms for several weeks, detailing all the environmental and emotional factors that are linked with your asthma. When you have an asthma attack, go back to your asthma diary to see which factor, or combination of factors, might have contributed to it. Once you know what these factors are, you can take steps to control many of them.  Allergies: If you have allergies and asthma, it is important to take asthma prevention steps at home. Asthma attacks (worsening of asthma symptoms) can be triggered by allergies, which can cause temporary increased inflammation of your airways. Minimizing contact with the substance to which you are allergic will help prevent an asthma attack. Animal Dander:  Some people are allergic to the flakes of skin or dried saliva from animals with fur or feathers. Keep these pets out of your home.  If you can't keep a pet outdoors, keep the pet out of your bedroom and other sleeping areas at all times, and keep the door closed.  Remove carpets and furniture covered with cloth from your home. If that is not possible, keep the pet away from fabric-covered furniture and carpets.  Dust Mites: Many  people with asthma are allergic to dust mites. Dust mites are tiny bugs that are found in every home, in mattresses, pillows, carpets, fabric-covered furniture, bedcovers, clothes, stuffed toys, fabric, and other fabric-covered items.  Cover your mattress in a special dust-proof cover.  Cover your pillow in a special dust-proof cover, or wash the pillow each week in hot water. Water must be hotter than 130 F to kill dust mites. Cold or warm water used with detergent and bleach can also be effective.  Wash the sheets and blankets on your bed each week in hot water.  Try not to sleep or lie on cloth-covered cushions.  Call ahead when traveling and ask for a smoke-free hotel room. Bring your own bedding and pillows, in case the hotel only supplies feather pillows and down comforters, which may contain dust mites and cause asthma symptoms.  Remove carpets from your bedroom and those laid on concrete, if you can.  Keep stuffed toys out of the bed, or wash the toys weekly in hot water or cooler water with detergent and bleach.  Cockroaches: Many people with asthma are allergic to the droppings and remains of cockroaches.  Keep food and garbage in closed containers. Never leave food out.  Use poison baits, traps, powders, gels, or paste (for example, boric acid).  If a spray is used to kill cockroaches, stay out of the room until the odor goes away.  Indoor Mold: Fix leaky faucets, pipes, or other sources of water that have mold around them.  Clean moldy surfaces with a cleaner that  has bleach in it.  Pollen and Outdoor Mold: When pollen or mold spore counts are high, try to keep your windows closed.  Stay indoors with windows closed from late morning to afternoon, if you can. Pollen and some mold spore counts are highest at that time.  Ask your caregiver whether you need to take or increase anti-inflammatory medicine before your allergy season starts.  Irritants:  Tobacco smoke is an irritant. If you  smoke, ask your caregiver how you can quit. Ask family members to quit smoking, too. Do not allow smoking in your home or car.  If possible, do not use a wood-burning stove, kerosene heater, or fireplace. Minimize exposure to all sources of smoke, including incense, candles, fires, and fireworks.  Try to stay away from strong odors and sprays, such as perfume, talcum powder, hair spray, and paints.  Decrease humidity in your home and use an indoor air cleaning device. Reduce indoor humidity to below 60 percent. Dehumidifiers or central air conditioners can do this.  Try to have someone else vacuum for you once or twice a week, if you can. Stay out of rooms while they are being vacuumed and for a short while afterward.  If you vacuum, use a dust mask from a hardware store, a double-layered or microfilter vacuum cleaner bag, or a vacuum cleaner with a HEPA filter.  Sulfites in foods and beverages can be irritants. Do not drink beer or wine, or eat dried fruit, processed potatoes, or shrimp if they cause asthma symptoms.  Cold air can trigger an asthma attack. Cover your nose and mouth with a scarf on cold or windy days.  Several health conditions can make asthma more difficult to manage, including runny nose, sinus infections, reflux disease, psychological stress, and sleep apnea. Your caregiver will treat these conditions, as well.  Avoid close contact with people who have a cold or the flu, since your asthma symptoms may get worse if you catch the infection from them. Wash your hands thoroughly after touching items that may have been handled by people with a respiratory infection.  Get a flu shot every year to protect against the flu virus, which often makes asthma worse for days or weeks. Also get a pneumonia shot once every five to 10 years.  Drugs: Aspirin and other painkillers can cause asthma attacks. 10% to 20% of people with asthma have sensitivity to aspirin or a group of painkillers called  non-steroidal anti-inflammatory drugs (NSAIDS), such as ibuprofen and naproxen. These drugs are used to treat pain and reduce fevers. Asthma attacks caused by any of these medicines can be severe and even fatal. These drugs must be avoided in people who have known aspirin sensitive asthma. Products with acetaminophen are considered safe for people who have asthma. It is important that people with aspirin sensitivity read labels of all over-the-counter drugs used to treat pain, colds, coughs, and fever.  Beta blockers and ACE inhibitors are other drugs which you should discuss with your caregiver, in relation to your asthma.   EXERCISE  If you have exercise-induced asthma, or are planning vigorous exercise, or exercise in cold, humid, or dry environments, prevent exercise-induced asthma by following your caregiver's advice regarding asthma treatment before exercising. Additional Information:  Your vital signs today were: BP 114/87 (BP Location: Left Arm)    Pulse 88    Temp 98 F (36.7 C) (Oral)    Resp 16    Ht 5\' 6"  (1.676 m)    Wt 102.1  kg (225 lb)    LMP 04/17/2017 (Approximate)    SpO2 98%    BMI 36.32 kg/m  If your blood pressure (BP) was elevated above 135/85 this visit, please have this repeated by your doctor within one month. ---------------

## 2017-06-18 MED FILL — predniSONE 10 MG TABS: 10 | 4 days supply | Qty: 16 | Fill #0

## 2017-06-18 MED FILL — !BREO ELLIPTA 100-25 MCG IN: 100-25 | 30 days supply | Qty: 60 | Fill #0

## 2017-06-19 ENCOUNTER — Other Ambulatory Visit: Payer: Self-pay

## 2017-06-19 DIAGNOSIS — J45909 Unspecified asthma, uncomplicated: Secondary | ICD-10-CM

## 2017-06-19 MED ORDER — FLUTICASONE FUROATE-VILANTEROL 100-25 MCG/INH IN AEPB: INHALATION_SPRAY | RESPIRATORY_TRACT | 0 refills | Status: DC

## 2017-07-23 MED FILL — !BREO ELLIPTA 100-25 MCG IN: 100-25 | 30 days supply | Qty: 60 | Fill #0

## 2017-07-23 MED FILL — !VENTOLIN HFA INHALER: 108 (90 BAS | 16 days supply | Qty: 18 | Fill #7

## 2017-10-13 ENCOUNTER — Other Ambulatory Visit: Payer: Self-pay | Admitting: Internal Medicine

## 2017-10-13 DIAGNOSIS — J45909 Unspecified asthma, uncomplicated: Secondary | ICD-10-CM

## 2017-10-13 MED FILL — !VENTOLIN HFA INHALER: 108 (90 BAS | 16 days supply | Qty: 18 | Fill #8

## 2017-10-14 ENCOUNTER — Other Ambulatory Visit: Payer: Self-pay | Admitting: *Deleted

## 2017-10-14 ENCOUNTER — Telehealth: Payer: Self-pay | Admitting: *Deleted

## 2017-10-14 ENCOUNTER — Other Ambulatory Visit: Payer: Self-pay | Admitting: Internal Medicine

## 2017-10-14 DIAGNOSIS — J45909 Unspecified asthma, uncomplicated: Secondary | ICD-10-CM

## 2017-10-14 MED ORDER — FLUTICASONE FUROATE-VILANTEROL 100-25 MCG/INH IN AEPB: INHALATION_SPRAY | RESPIRATORY_TRACT | 0 refills | Status: DC

## 2017-10-14 MED FILL — **BREO ELLIPTA 100-25 MCG I: 100-25MCG | 28 days supply | Qty: 56 | Fill #0

## 2017-10-14 NOTE — Telephone Encounter (Signed)
Pt came in to office with concerns of her fingertips and hands turning blue when she holds her arms down. She noticed this today. Denies pain. Hands are warm, not cold to touch nor does she feels tingling. Bilateral  palpable and normal pulses.   She has been off Breo x 3 days. No SOB, pt in no acute distress. She is able to speak in full sentences without difficulty. She has past history of smoking. She has appointment schedule June 4 with Dr. Laural Benes to establish care. Pt instructed to watch closely, if worsens with SOB or pain f/u for evaluation in ED or urgent care.

## 2017-10-15 NOTE — Telephone Encounter (Signed)
Noted  

## 2017-11-11 ENCOUNTER — Ambulatory Visit: Payer: Self-pay | Admitting: Internal Medicine

## 2017-11-20 ENCOUNTER — Ambulatory Visit: Payer: Self-pay | Admitting: Internal Medicine

## 2017-11-25 ENCOUNTER — Emergency Department (HOSPITAL_COMMUNITY)
Admission: EM | Admit: 2017-11-25 | Discharge: 2017-11-25 | Disposition: A | Discharge status: Home / Self Care | Payer: Self-pay | Attending: Emergency Medicine | Admitting: Emergency Medicine

## 2017-11-25 ENCOUNTER — Encounter (HOSPITAL_COMMUNITY): Payer: Self-pay

## 2017-11-25 DIAGNOSIS — J45909 Unspecified asthma, uncomplicated: Secondary | ICD-10-CM | POA: Insufficient documentation

## 2017-11-25 DIAGNOSIS — Z79899 Other long term (current) drug therapy: Secondary | ICD-10-CM | POA: Insufficient documentation

## 2017-11-25 DIAGNOSIS — Z87891 Personal history of nicotine dependence: Secondary | ICD-10-CM | POA: Insufficient documentation

## 2017-11-25 DIAGNOSIS — J4531 Mild persistent asthma with (acute) exacerbation: Secondary | ICD-10-CM | POA: Insufficient documentation

## 2017-11-25 MED ORDER — FLUTICASONE FUROATE-VILANTEROL 100-25 MCG/INH IN AEPB: INHALATION_SPRAY | RESPIRATORY_TRACT | 0 refills | Status: DC

## 2017-11-25 MED ORDER — ALBUTEROL SULFATE (2.5 MG/3ML) 0.083% IN NEBU
5.0000 mg | INHALATION_SOLUTION | Freq: Once | RESPIRATORY_TRACT | Status: AC
  Administered 2017-11-25: 5 mg via RESPIRATORY_TRACT
  Filled 2017-11-25: qty 6

## 2017-11-25 MED ORDER — DEXAMETHASONE 4 MG PO TABS
10.0000 mg | ORAL_TABLET | Freq: Once | ORAL | Status: AC
  Administered 2017-11-25: 10 mg via ORAL
  Filled 2017-11-25: qty 3

## 2017-11-25 MED ORDER — ALBUTEROL SULFATE HFA 108 (90 BASE) MCG/ACT IN AERS
2.0000 | INHALATION_SPRAY | Freq: Once | RESPIRATORY_TRACT | Status: AC
  Administered 2017-11-25: 2 via RESPIRATORY_TRACT
  Filled 2017-11-25: qty 6.7

## 2017-11-25 MED ORDER — ALBUTEROL SULFATE HFA 108 (90 BASE) MCG/ACT IN AERS: 2.0000 | INHALATION_SPRAY | RESPIRATORY_TRACT | 0 refills | Status: DC | PRN

## 2017-11-25 NOTE — ED Provider Notes (Signed)
[facility]   CSN: [id number] Arrival date & time: 11/25/17  0106     History   Chief Complaint Chief Complaint  Patient presents with  . Asthma    HPI Catherine Porter is a [age] y.o. female.  27 year old asthmatic presents to the emergency department for worsening shortness of breath and chest tightness.  She ran out of her Brio Ellipta 2 days ago and ran out of her albuterol inhaler yesterday.  She began to have worsening shortness of breath tonight.  She received a breathing treatment in triage which has completely improved her shortness of breath.  She currently has no complaints.  She is breathing at baseline.  No recent fevers or sick contacts.  No other complaints for this visit     Past Medical History:  Diagnosis Date  . Arthritis    "fingers" (06/20/2015)  . Asthma   . Gestational HTN 2012   when pregnant with son   . Migraine    "not often anymore" (06/20/2015)    Patient Active Problem List   Diagnosis Date Noted  . Pelvic pain in female 07/17/2015  . Persistent asthma 03/30/2015    Past Surgical History:  Procedure Laterality Date  . LAPAROSCOPIC CHOLECYSTECTOMY  2014  . TONSILLECTOMY AND ADENOIDECTOMY  ~ 2008  . TONSILLECTOMY/ADENOIDECTOMY/TURBINATE REDUCTION  ~ 2008     OB History    Gravida  2   Para  2   Term  2   Preterm  0   AB  0   Living  2     SAB  0   TAB  0   Ectopic  0   Multiple  0   Live Births  2        Obstetric Comments  2 children, one boy and girl         Home Medications    Prior to Admission medications   Medication Sig Start Date End Date Taking? Authorizing Provider  albuterol (VENTOLIN HFA) 108 (90 Base) MCG/ACT inhaler Inhale 2 puffs into the lungs every 4 (four) hours as needed for wheezing or shortness of breath. 11/25/17   Antony MaduraHumes, Ronnette Rump, PA-C  fluticasone furoate-vilanterol (BREO ELLIPTA) 100-25 MCG/INH AEPB INHALE 1 PUFF INTO THE LUNGS DAILY.  11/25/17   Antony MaduraHumes, Azha Constantin, PA-C  methocarbamol (ROBAXIN) 500 MG tablet Take 1 tablet (500 mg total) by mouth 2 (two) times daily. Patient not taking: Reported on 11/25/2017 03/21/17   Muthersbaugh, Dahlia ClientHannah, PA-C  naproxen (NAPROSYN) 500 MG tablet Take 1 tablet (500 mg total) by mouth 2 (two) times daily with a meal. Patient not taking: Reported on 11/25/2017 03/21/17   Muthersbaugh, Dahlia ClientHannah, PA-C    Family History Family History  Problem Relation Age of Onset  . Anesthesia problems Neg Hx   . Asthma Neg Hx     Social History Social History   Tobacco Use  . Smoking status: Former Smoker    Packs/day: 0.00    Years: 4.00    Pack years: 0.00    Types: Cigarettes    Last attempt to quit: 10/06/2013    Years since quitting: 4.1  . Smokeless tobacco: Never Used  Substance Use Topics  . Alcohol use: Yes  . Drug use: No     Allergies   Patient has no known allergies.   Review of Systems Review of Systems Ten systems reviewed and are negative for acute change, except as noted in the HPI.    Physical  Exam Updated Vital Signs BP (!) 131/93 (BP Location: Left Arm)   Pulse 76   Temp 98.2 F (36.8 C) (Oral)   Resp 20   Ht 5\' 5"  (1.651 m)   Wt 97.1 kg (214 lb)   SpO2 100%   BMI 35.61 kg/m   Physical Exam  Constitutional: She is oriented to person, place, and time. She appears well-developed and well-nourished. No distress.  Nontoxic appearing and in NAD  HENT:  Head: Normocephalic and atraumatic.  Eyes: Conjunctivae and EOM are normal. No scleral icterus.  Neck: Normal range of motion.  Cardiovascular: Normal rate, regular rhythm and intact distal pulses.  Pulmonary/Chest: Effort normal. No stridor. No respiratory distress. She has no wheezes. She has no rales.  Lungs CTAB. Respirations even and unlabored.  Musculoskeletal: Normal range of motion.  Neurological: She is alert and oriented to person, place, and time. She exhibits normal muscle tone. Coordination normal.    Skin: Skin is warm and dry. No rash noted. She is not diaphoretic. No erythema. No pallor.  Psychiatric: She has a normal mood and affect. Her behavior is normal.  Nursing note and vitals reviewed.    ED Treatments / Results  Labs (all labs ordered are listed, but only abnormal results are displayed) Labs Reviewed - No data to display  EKG None  Radiology No results found.  Procedures Procedures (including critical care time)  Medications Ordered in ED Medications  dexamethasone (DECADRON) tablet 10 mg (has no administration in time range)  albuterol (PROVENTIL HFA;VENTOLIN HFA) 108 (90 Base) MCG/ACT inhaler 2 puff (has no administration in time range)  albuterol (PROVENTIL) (2.5 MG/3ML) 0.083% nebulizer solution 5 mg (5 mg Nebulization Given 11/25/17 0114)     Initial Impression / Assessment and Plan / ED Course  I have reviewed the triage vital signs and the nursing notes.  Pertinent labs & imaging results that were available during my care of the patient were reviewed by me and considered in my medical decision making (see chart for details).     27 year old female with asthma presenting for asthma exacerbation.  She has been out of her Brio Ellipta and albuterol inhaler.  Symptoms resolved following albuterol nebulizer.  Lungs clear to auscultation.  No hypoxia.  She reports that she is breathing at baseline.  Plan for discharge with albuterol.  Refills given for asthma medications.  Return precautions discussed and provided. Patient discharged in stable condition with no unaddressed concerns.   Final Clinical Impressions(s) / ED Diagnoses   Final diagnoses:  Mild persistent asthma with exacerbation    ED Discharge Orders        Ordered    fluticasone furoate-vilanterol (BREO ELLIPTA) 100-25 MCG/INH AEPB    Note to Pharmacy:  Must have office visit for refills - last refill   11/25/17 0319    albuterol (VENTOLIN HFA) 108 (90 Base) MCG/ACT inhaler  Every 4 hours  PRN     11/25/17 0319       Antony Madura, PA-C 11/25/17 0330    Gilda Crease, MD 11/25/17 [phone]

## 2017-11-25 NOTE — Discharge Instructions (Signed)
Continue your daily prescribed medications.  Follow-up with your primary care doctor regarding your visit.

## 2017-11-25 NOTE — ED Triage Notes (Signed)
Pt reports that she began to have asthma attack several hours ago, audible wheezing, unrelieved by at home inhaler, speaks in short sentences, ran out of her other two inhalers today

## 2017-12-02 MED FILL — !BREO ELLIPTA 100-25 MCG IN: 100-25 | 30 days supply | Qty: 60 | Fill #0

## 2017-12-02 MED FILL — !VENTOLIN HFA INHALER: 108 (90 BAS | 48 days supply | Qty: 54 | Fill #0

## 2018-01-05 ENCOUNTER — Emergency Department (HOSPITAL_COMMUNITY)
Admission: EM | Admit: 2018-01-05 | Discharge: 2018-01-06 | Disposition: A | Discharge status: Home / Self Care | Payer: Self-pay | Attending: Emergency Medicine | Admitting: Emergency Medicine

## 2018-01-05 ENCOUNTER — Encounter (HOSPITAL_COMMUNITY): Payer: Self-pay

## 2018-01-05 ENCOUNTER — Other Ambulatory Visit: Payer: Self-pay

## 2018-01-05 DIAGNOSIS — J4531 Mild persistent asthma with (acute) exacerbation: Secondary | ICD-10-CM | POA: Insufficient documentation

## 2018-01-05 DIAGNOSIS — Z87891 Personal history of nicotine dependence: Secondary | ICD-10-CM | POA: Insufficient documentation

## 2018-01-05 DIAGNOSIS — J45909 Unspecified asthma, uncomplicated: Secondary | ICD-10-CM

## 2018-01-05 MED ORDER — ALBUTEROL SULFATE (2.5 MG/3ML) 0.083% IN NEBU
5.0000 mg | INHALATION_SOLUTION | Freq: Once | RESPIRATORY_TRACT | Status: AC
  Administered 2018-01-05: 5 mg via RESPIRATORY_TRACT
  Filled 2018-01-05: qty 6

## 2018-01-05 NOTE — ED Triage Notes (Signed)
Pt states that he asthma has been flaring up and she has been out of meds for two days, wheezing all lobes

## 2018-01-06 ENCOUNTER — Other Ambulatory Visit: Payer: Self-pay

## 2018-01-06 DIAGNOSIS — J45909 Unspecified asthma, uncomplicated: Secondary | ICD-10-CM

## 2018-01-06 MED ORDER — ALBUTEROL SULFATE HFA 108 (90 BASE) MCG/ACT IN AERS
2.0000 | INHALATION_SPRAY | Freq: Once | RESPIRATORY_TRACT | Status: AC
  Administered 2018-01-06: 2 via RESPIRATORY_TRACT

## 2018-01-06 MED ORDER — ALBUTEROL SULFATE (2.5 MG/3ML) 0.083% IN NEBU
5.0000 mg | INHALATION_SOLUTION | Freq: Once | RESPIRATORY_TRACT | Status: AC
  Administered 2018-01-06: 5 mg via RESPIRATORY_TRACT
  Filled 2018-01-06: qty 6

## 2018-01-06 MED ORDER — FLUTICASONE FUROATE-VILANTEROL 100-25 MCG/INH IN AEPB: INHALATION_SPRAY | RESPIRATORY_TRACT | 0 refills | Status: DC

## 2018-01-06 MED ORDER — ALBUTEROL SULFATE HFA 108 (90 BASE) MCG/ACT IN AERS
INHALATION_SPRAY | RESPIRATORY_TRACT | Status: AC
  Filled 2018-01-06: qty 6.7

## 2018-01-06 MED ORDER — IPRATROPIUM BROMIDE 0.02 % IN SOLN
0.5000 mg | Freq: Once | RESPIRATORY_TRACT | Status: AC
  Administered 2018-01-06: 0.5 mg via RESPIRATORY_TRACT
  Filled 2018-01-06: qty 2.5

## 2018-01-06 MED ORDER — DEXAMETHASONE SODIUM PHOSPHATE 10 MG/ML IJ SOLN
10.0000 mg | Freq: Once | INTRAMUSCULAR | Status: AC
  Administered 2018-01-06: 10 mg via INTRAMUSCULAR
  Filled 2018-01-06: qty 1

## 2018-01-06 MED FILL — !BREO ELLIPTA 100-25 MCG IN: 100-25 | 30 days supply | Qty: 60 | Fill #0

## 2018-01-06 NOTE — ED Provider Notes (Signed)
[facility]   CSN: [id number] Arrival date & time: 01/05/18  2242     History   Chief Complaint Chief Complaint  Patient presents with  . Asthma    HPI Lenon CurtDaynez O Peffley is a 27 y.o. female.   27 year old female with a history of asthma presents to the emergency department for evaluation of shortness of breath and wheezing.  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  She has been out of her medications for the past 2 days and does not have scheduled follow-up with her doctor until next week.  She reports worsening shortness of breath tonight.  No associated fevers, syncope, hemoptysis.  Symptoms are consistent with past asthma exacerbations.     Past Medical History:  Diagnosis Date  . Arthritis    "fingers" (06/20/2015)  . Asthma   . Gestational HTN 2012   when pregnant with son   . Migraine    "not often anymore" (06/20/2015)    Patient Active Problem List   Diagnosis Date Noted  . Pelvic pain in female 07/17/2015  . Persistent asthma 03/30/2015    Past Surgical History:  Procedure Laterality Date  . LAPAROSCOPIC CHOLECYSTECTOMY  2014  . TONSILLECTOMY AND ADENOIDECTOMY  ~ 2008  . TONSILLECTOMY/ADENOIDECTOMY/TURBINATE REDUCTION  ~ 2008     OB History    Gravida  2   Para  2   Term  2   Preterm  0   AB  0   Living  2     SAB  0   TAB  0   Ectopic  0   Multiple  0   Live Births  2        Obstetric Comments  2 children, one boy and girl         Home Medications    Prior to Admission medications   Medication Sig Start Date End Date Taking? Authorizing Provider  albuterol (VENTOLIN HFA) 108 (90 Base) MCG/ACT inhaler Inhale 2 puffs into the lungs every 4 (four) hours as needed for wheezing or shortness of breath. 11/25/17   Antony MaduraHumes, Michaela Broski, PA-C  fluticasone furoate-vilanterol (BREO ELLIPTA) 100-25 MCG/INH AEPB INHALE 1  PUFF INTO THE LUNGS DAILY. 01/06/18   Antony MaduraHumes, Chuong Casebeer, PA-C  methocarbamol (ROBAXIN) 500 MG tablet Take 1 tablet (500 mg total) by mouth 2 (two) times daily. Patient not taking: Reported on 11/25/2017 03/21/17   Muthersbaugh, Dahlia ClientHannah, PA-C  naproxen (NAPROSYN) 500 MG tablet Take 1 tablet (500 mg total) by mouth 2 (two) times daily with a meal. Patient not taking: Reported on 11/25/2017 03/21/17   Muthersbaugh, Dahlia ClientHannah, PA-C    Family History Family History  Problem Relation Age of Onset  . Anesthesia problems Neg Hx   . Asthma Neg Hx     Social History Social History   Tobacco Use  . Smoking status: Former Smoker    Packs/day: 0.00    Years: 4.00    Pack years: 0.00    Types: Cigarettes    Last attempt to quit: 10/06/2013    Years since quitting: 4.2  . Smokeless tobacco: Never Used  Substance Use Topics  . Alcohol use: Yes  . Drug use: No     Allergies   Patient has no known allergies.   Review of Systems Review of Systems Ten systems reviewed and are negative for acute change, except as noted  in the HPI.    Physical Exam Updated Vital Signs BP 126/82   Pulse 85   Temp 98 F (36.7 C) (Oral)   Resp 18   SpO2 98%   Physical Exam  Constitutional: She is oriented to person, place, and time. She appears well-developed and well-nourished. No distress.  Nontoxic appearing and in no distress  HENT:  Head: Normocephalic and atraumatic.  Eyes: Conjunctivae and EOM are normal. No scleral icterus.  Neck: Normal range of motion.  Cardiovascular: Normal rate, regular rhythm and intact distal pulses.  Pulmonary/Chest: Effort normal. No stridor. No respiratory distress. She has wheezes. She has no rales.  Very faint expiratory wheeze.  No tachypnea or accessory muscle use.  Chest expansion symmetric.  Musculoskeletal: Normal range of motion.  Neurological: She is alert and oriented to person, place, and time. She exhibits normal muscle tone. Coordination normal.  Skin: Skin is  warm and dry. No rash noted. She is not diaphoretic. No erythema. No pallor.  Psychiatric: She has a normal mood and affect. Her behavior is normal.  Nursing note and vitals reviewed.    ED Treatments / Results  Labs (all labs ordered are listed, but only abnormal results are displayed) Labs Reviewed - No data to display  EKG None  Radiology No results found.  Procedures Procedures (including critical care time)  Medications Ordered in ED Medications  albuterol (PROVENTIL HFA;VENTOLIN HFA) 108 (90 Base) MCG/ACT inhaler 2 puff (has no administration in time range)  albuterol (PROVENTIL) (2.5 MG/3ML) 0.083% nebulizer solution 5 mg (5 mg Nebulization Given 01/05/18 2255)  albuterol (PROVENTIL) (2.5 MG/3ML) 0.083% nebulizer solution 5 mg (5 mg Nebulization Given 01/06/18 0027)  ipratropium (ATROVENT) nebulizer solution 0.5 mg (0.5 mg Nebulization Given 01/06/18 0027)  dexamethasone (DECADRON) injection 10 mg (10 mg Intramuscular Given 01/06/18 0031)    1:26 AM Lungs CTAB. VSS. Patient states she is breathing at baseline.   Initial Impression / Assessment and Plan / ED Course  I have reviewed the triage vital signs and the nursing notes.  Pertinent labs & imaging results that were available during my care of the patient were reviewed by me and considered in my medical decision making (see chart for details).     27 year old female presents for asthma exacerbation.  She has been out of her medications for 2 days.  Symptoms have improved with DuoNeb and IM Decadron.  The patient states that she is now breathing at baseline.  No URI type symptoms, fever, rales, hypoxia to suggest infectious etiology.  Patient states that symptoms are consistent with prior asthma exacerbations.  Will discharge with prescription for Brio Ellipta.  Albuterol inhaler given in the emergency department for use every 4-6 hours.  Encouraged primary care follow-up for recheck.  Return precautions discussed and  provided. Patient discharged in stable condition with no unaddressed concerns.   Final Clinical Impressions(s) / ED Diagnoses   Final diagnoses:  Mild persistent asthma with exacerbation    ED Discharge Orders        Ordered    fluticasone furoate-vilanterol (BREO ELLIPTA) 100-25 MCG/INH AEPB    Note to Pharmacy:  Must have office visit for refills - last refill   01/06/18 0127       Antony Madura, PA-C 01/06/18 0154    Shon Baton, MD 01/06/18 [phone]

## 2018-01-06 NOTE — ED Notes (Signed)
PT states understanding of care given, follow up care, and medication prescribed. PT ambulated from ED to car with a steady gait. 

## 2018-01-06 NOTE — Discharge Instructions (Addendum)
Continue your daily medications.  Follow-up with your primary doctor. °

## 2018-01-15 ENCOUNTER — Other Ambulatory Visit: Payer: Self-pay

## 2018-01-15 ENCOUNTER — Encounter: Payer: Self-pay | Admitting: Internal Medicine

## 2018-01-15 ENCOUNTER — Ambulatory Visit: Payer: Self-pay | Attending: Internal Medicine | Admitting: Internal Medicine

## 2018-01-15 DIAGNOSIS — J4540 Moderate persistent asthma, uncomplicated: Secondary | ICD-10-CM

## 2018-01-15 DIAGNOSIS — Z87891 Personal history of nicotine dependence: Secondary | ICD-10-CM | POA: Insufficient documentation

## 2018-01-15 DIAGNOSIS — Z9049 Acquired absence of other specified parts of digestive tract: Secondary | ICD-10-CM | POA: Insufficient documentation

## 2018-01-15 MED ORDER — FLUTICASONE FUROATE-VILANTEROL 100-25 MCG/INH IN AEPB: INHALATION_SPRAY | RESPIRATORY_TRACT | 6 refills | Status: DC

## 2018-01-15 MED ORDER — ALBUTEROL SULFATE HFA 108 (90 BASE) MCG/ACT IN AERS: 2.0000 | INHALATION_SPRAY | RESPIRATORY_TRACT | 11 refills | Status: DC | PRN

## 2018-01-15 NOTE — Patient Instructions (Signed)

## 2018-01-15 NOTE — Progress Notes (Signed)
HFU Medication RF

## 2018-01-15 NOTE — Progress Notes (Signed)
Patient ID: Catherine Porter, female    DOB: [birth year]  MRN: [id number]  CC: Hospitalization Follow-up   Subjective: Catherine Porter is a 27 y.o. female who presents for ER f/u on asthma Her concerns today include:    Seen in ER 7/30 due to SOB.  She was exposed to dust at work while doing some cleaning.  She had ran out of Breo and Ventolin inhaler for 2 days and had flareup because of this.  Both prescriptions were refilled through the ER and patient was told to follow-up.  Today she reports she is doing much better now that she is back on both inhalers.  Initially she had to use the Ventolin inhaler about 4 times a day but as the Virgel Bouquet gets into her system she is now using it less about once to twice a day.  She denies any cough  Patient Active Problem List   Diagnosis Date Noted  . Pelvic pain in female 07/17/2015  . Persistent asthma 03/30/2015     No current outpatient medications on file prior to visit.   No current facility-administered medications on file prior to visit.     No Known Allergies  Social History   Socioeconomic History  . Marital status: Married    Spouse name: Not on file  . Number of children: Not on file  . Years of education: Not on file  . Highest education level: Not on file  Occupational History  . Not on file  Social Needs  . Financial resource strain: Not on file  . Food insecurity:    Worry: Not on file    Inability: Not on file  . Transportation needs:    Medical: Not on file    Non-medical: Not on file  Tobacco Use  . Smoking status: Former Smoker    Packs/day: 0.00    Years: 4.00    Pack years: 0.00    Types: Cigarettes    Last attempt to quit: 10/06/2013    Years since quitting: 4.2  . Smokeless tobacco: Never Used  Substance and Sexual Activity  . Alcohol use: Yes    Comment: socially  . Drug use: No  . Sexual activity: Yes    Birth control/protection: IUD    Comment: IUD 2 years  Lifestyle  . Physical activity:   Days per week: Not on file    Minutes per session: Not on file  . Stress: Not on file  Relationships  . Social connections:    Talks on phone: Not on file    Gets together: Not on file    Attends religious service: Not on file    Active member of club or organization: Not on file    Attends meetings of clubs or organizations: Not on file    Relationship status: Not on file  . Intimate partner violence:    Fear of current or ex partner: Not on file    Emotionally abused: Not on file    Physically abused: Not on file    Forced sexual activity: Not on file  Other Topics Concern  . Not on file  Social History Narrative  . Not on file    Family History  Problem Relation Age of Onset  . Anesthesia problems Neg Hx   . Asthma Neg Hx     Past Surgical History:  Procedure Laterality Date  . LAPAROSCOPIC CHOLECYSTECTOMY  2014  . TONSILLECTOMY AND ADENOIDECTOMY  ~ 2008  . TONSILLECTOMY/ADENOIDECTOMY/TURBINATE REDUCTION  ~  2008    ROS: Review of Systems Negative except as stated above PHYSICAL EXAM: BP 124/87 (BP Location: Right Arm, Patient Position: Sitting, Cuff Size: Large)   Pulse 75   Temp 98.7 F (37.1 C) (Oral)   Resp 16   Wt 218 lb (98.9 kg)   LMP 12/25/2017 (Approximate)   SpO2 96%   BMI 36.28 kg/m   Physical Exam  General appearance - alert, well appearing, young female and in no distress Mental status - normal mood, behavior, speech, dress, motor activity, and thought processes Chest - clear to auscultation, no wheezes, rales or rhonchi, symmetric air entry Heart - normal rate, regular rhythm, normal S1, S2, no murmurs, rubs, clicks or gallops Extremities - peripheral pulses normal, no pedal edema, no clubbing or cyanosis  ASSESSMENT AND PLAN: 1. Moderate persistent asthma without complication Refill given on both medications.  Advised to use Breo daily and Ventolin as needed. - fluticasone furoate-vilanterol (BREO ELLIPTA) 100-25 MCG/INH AEPB; INHALE 1 PUFF  INTO THE LUNGS DAILY.  Dispense: 1 each; Refill: 6 - albuterol (VENTOLIN HFA) 108 (90 Base) MCG/ACT inhaler; Inhale 2 puffs into the lungs every 4 (four) hours as needed for wheezing or shortness of breath.  Dispense: 54 g; Refill: 11  Patient was given the opportunity to ask questions.  Patient verbalized understanding of the plan and was able to repeat key elements of the plan.   No orders of the defined types were placed in this encounter.    Requested Prescriptions   Signed Prescriptions Disp Refills  . fluticasone furoate-vilanterol (BREO ELLIPTA) 100-25 MCG/INH AEPB 1 each 6    Sig: INHALE 1 PUFF INTO THE LUNGS DAILY.  Marland Kitchen. albuterol (VENTOLIN HFA) 108 (90 Base) MCG/ACT inhaler 54 g 11    Sig: Inhale 2 puffs into the lungs every 4 (four) hours as needed for wheezing or shortness of breath.    Return if symptoms worsen or fail to improve.  Jonah Blueeborah Johnson, MD, FACP

## 2018-01-27 MED FILL — !BREO ELLIPTA 100-25 MCG IN: 100-25 | 30 days supply | Qty: 60 | Fill #0

## 2018-01-27 MED FILL — !VENTOLIN HFA INHALER: 108 (90 BAS | 16 days supply | Qty: 18 | Fill #0

## 2018-03-16 MED FILL — ALBUTEROL SULFATE HFA 108 (: 108 (90 BAS | 16 days supply | Qty: 18 | Fill #1

## 2018-03-16 MED FILL — !BREO ELLIPTA 100-25 MCG IN: 100-25 | 30 days supply | Qty: 60 | Fill #1

## 2018-04-15 MED FILL — !BREO ELLIPTA 100-25 MCG IN: 100-25 | 30 days supply | Qty: 60 | Fill #2

## 2018-04-15 MED FILL — ALBUTEROL SULFATE HFA 108 (: 108 (90 BAS | 16 days supply | Qty: 18 | Fill #2

## 2018-05-14 MED FILL — $BREO ELLIPTA 100-25 MCG IH: 100-25 MCG | 90 days supply | Qty: 180 | Fill #3

## 2018-06-24 ENCOUNTER — Telehealth: Payer: Self-pay | Admitting: Internal Medicine
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
## 2018-06-24 NOTE — Telephone Encounter (Signed)
Attempt to call patient back x 2 to follow- up.   Unable to leave a message.  At best, patient will need an office visit.
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
## 2018-06-29 ENCOUNTER — Ambulatory Visit: Payer: Self-pay | Admitting: Family Medicine

## 2018-07-20 ENCOUNTER — Ambulatory Visit: Payer: Self-pay | Attending: Family Medicine | Admitting: Internal Medicine

## 2018-07-20 ENCOUNTER — Encounter: Payer: Self-pay | Admitting: Internal Medicine

## 2018-07-20 VITALS — BP 140/79 | HR 80 | Temp 97.8°F | Resp 16 | Wt 224.0 lb

## 2018-07-20 DIAGNOSIS — Z124 Encounter for screening for malignant neoplasm of cervix: Secondary | ICD-10-CM

## 2018-07-20 DIAGNOSIS — F329 Major depressive disorder, single episode, unspecified: Secondary | ICD-10-CM

## 2018-07-20 DIAGNOSIS — T7421XA Adult sexual abuse, confirmed, initial encounter: Secondary | ICD-10-CM

## 2018-07-20 DIAGNOSIS — Z3202 Encounter for pregnancy test, result negative: Secondary | ICD-10-CM

## 2018-07-20 DIAGNOSIS — Z113 Encounter for screening for infections with a predominantly sexual mode of transmission: Secondary | ICD-10-CM

## 2018-07-20 DIAGNOSIS — N911 Secondary amenorrhea: Secondary | ICD-10-CM

## 2018-07-20 DIAGNOSIS — R4589 Other symptoms and signs involving emotional state: Secondary | ICD-10-CM

## 2018-07-20 LAB — POCT URINE PREGNANCY: PREG TEST UR: NEGATIVE
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
## 2018-07-21 LAB — CYTOLOGY - PAP
BACTERIAL VAGINITIS: NEGATIVE
Candida vaginitis: POSITIVE — AB
Chlamydia: POSITIVE — AB
DIAGNOSIS: NEGATIVE
Neisseria Gonorrhea: NEGATIVE
Trichomonas: NEGATIVE

## 2018-07-21 LAB — RPR: RPR: NONREACTIVE

## 2018-07-21 LAB — HIV ANTIBODY (ROUTINE TESTING W REFLEX): HIV SCREEN 4TH GENERATION: NONREACTIVE

## 2018-07-22 ENCOUNTER — Other Ambulatory Visit: Payer: Self-pay | Admitting: Internal Medicine

## 2018-07-22 ENCOUNTER — Telehealth: Payer: Self-pay | Admitting: Internal Medicine

## 2018-07-22 MED ORDER — FLUCONAZOLE 150 MG PO TABS: 150.0000 mg | ORAL_TABLET | Freq: Once | ORAL | 0 refills | Status: AC

## 2018-07-22 MED ORDER — AZITHROMYCIN 250 MG PO TABS: ORAL_TABLET | ORAL | 0 refills | Status: DC

## 2018-07-22 MED FILL — AZITHROMYCIN 250 MG TABLET: 250 | 1 days supply | Qty: 4 | Fill #0

## 2018-07-22 MED FILL — FLUCONAZOLE 150 MG TABS: 150 | 1 days supply | Qty: 1 | Fill #0

## 2018-07-22 NOTE — Telephone Encounter (Signed)
Patient called to check on their results. Please follow up.

## 2018-07-23 NOTE — Telephone Encounter (Signed)
Contacted pt to go over results pt is aware and pt is aware that results have to be reported to health department. Pt doesn't have any questions or concerns

## 2018-07-28 MED FILL — $VENTOLIN HFA 18G INHALER: 108 (90 BAS | 75 days supply | Qty: 54 | Fill #3

## 2018-08-07 MED FILL — $BREO ELLIPTA 100-25 MCG IH: 100-25 MCG | 30 days supply | Qty: 60 | Fill #4

## 2018-09-10 ENCOUNTER — Other Ambulatory Visit: Payer: Self-pay | Admitting: Internal Medicine

## 2018-09-10 ENCOUNTER — Telehealth: Payer: Self-pay | Admitting: Internal Medicine

## 2018-09-10 DIAGNOSIS — J4540 Moderate persistent asthma, uncomplicated: Secondary | ICD-10-CM

## 2018-09-10 MED FILL — $BREO ELLIPTA 100-25 MCG IH: 100-25 MCG | 60 days supply | Qty: 120 | Fill #0

## 2018-09-10 NOTE — Telephone Encounter (Signed)
RX sent

## 2018-09-10 NOTE — Telephone Encounter (Signed)
1) Medication(s) Requested (by name): fluticasone furoate-vilanterol (BREO ELLIPTA) 100-25 MCG/INH AEPB [[id number]  2) Pharmacy of Choice: [facility] 3) Special Requests: Pt states she reached pharmacy and they advised to contact dr for refills  Approved medications will be sent to the pharmacy, we will reach out if there is an issue.  Requests made after 3pm may not be addressed until the following business day!  If a patient is unsure of the name of the medication(s) please note and ask patient to call back when they are able to provide all info, do not send to responsible party until all information is available!

## 2018-10-21 ENCOUNTER — Ambulatory Visit: Payer: Self-pay | Attending: Internal Medicine

## 2018-10-21 ENCOUNTER — Other Ambulatory Visit: Payer: Self-pay

## 2018-10-21 DIAGNOSIS — Z113 Encounter for screening for infections with a predominantly sexual mode of transmission: Secondary | ICD-10-CM

## 2018-10-22 LAB — HIV ANTIBODY (ROUTINE TESTING W REFLEX): HIV Screen 4th Generation wRfx: NONREACTIVE

## 2018-11-20 MED FILL — $BREO ELLIPTA 100-25 MCG IH: 100-25 MCG | 30 days supply | Qty: 60 | Fill #1

## 2019-01-01 ENCOUNTER — Other Ambulatory Visit: Payer: Self-pay | Admitting: Internal Medicine

## 2019-01-01 DIAGNOSIS — J4540 Moderate persistent asthma, uncomplicated: Secondary | ICD-10-CM

## 2019-01-01 MED FILL — $VENTOLIN HFA 18G INHALER: 108 (90 BAS | 25 days supply | Qty: 18 | Fill #4

## 2019-01-01 MED FILL — $BREO ELLIPTA 100-25 MCG IH: 100-25 MCG | 30 days supply | Qty: 60 | Fill #0

## 2019-02-19 ENCOUNTER — Other Ambulatory Visit: Payer: Self-pay | Admitting: Internal Medicine

## 2019-02-19 DIAGNOSIS — J4540 Moderate persistent asthma, uncomplicated: Secondary | ICD-10-CM

## 2019-02-19 MED FILL — $VENTOLIN HFA 18G INHALER: 108 (90 BAS | 25 days supply | Qty: 18 | Fill #0

## 2019-02-23 ENCOUNTER — Telehealth: Payer: Self-pay | Admitting: Internal Medicine

## 2019-02-23 ENCOUNTER — Other Ambulatory Visit: Payer: Self-pay

## 2019-02-23 DIAGNOSIS — J4540 Moderate persistent asthma, uncomplicated: Secondary | ICD-10-CM

## 2019-02-23 MED ORDER — FLUTICASONE FUROATE-VILANTEROL 100-25 MCG/INH IN AEPB: INHALATION_SPRAY | RESPIRATORY_TRACT | 0 refills | Status: DC

## 2019-02-23 MED FILL — $BREO ELLIPTA 100-25 MCG IH: 100-25 MCG | 30 days supply | Qty: 60 | Fill #0

## 2019-02-23 NOTE — Telephone Encounter (Signed)
1) Medication(s) Requested (by name): -fluticasone furoate-vilanterol (BREO ELLIPTA) 100-25 MCG/INH AEPB   2) Pharmacy of Choice: -[facility] [place] 3) Special Requests:   Approved medications will be sent to the pharmacy, we will reach out if there is an issue.

## 2019-02-23 NOTE — Telephone Encounter (Signed)
Pt needs to be scheduled for PCP appointment. I have filled her inhaler for 1 month.

## 2019-02-23 NOTE — Telephone Encounter (Signed)
Luke may you fill if appropriate  

## 2019-03-23 ENCOUNTER — Other Ambulatory Visit: Payer: Self-pay | Admitting: Internal Medicine

## 2019-03-23 DIAGNOSIS — J4540 Moderate persistent asthma, uncomplicated: Secondary | ICD-10-CM

## 2019-03-24 ENCOUNTER — Other Ambulatory Visit: Payer: Self-pay | Admitting: Internal Medicine

## 2019-03-24 DIAGNOSIS — J4540 Moderate persistent asthma, uncomplicated: Secondary | ICD-10-CM

## 2019-03-26 ENCOUNTER — Telehealth: Payer: Self-pay | Admitting: Internal Medicine

## 2019-03-26 DIAGNOSIS — J4540 Moderate persistent asthma, uncomplicated: Secondary | ICD-10-CM

## 2019-03-26 MED ORDER — ALBUTEROL SULFATE HFA 108 (90 BASE) MCG/ACT IN AERS: 2.0000 | INHALATION_SPRAY | RESPIRATORY_TRACT | 0 refills | Status: DC | PRN

## 2019-03-26 MED FILL — $VENTOLIN HFA 18G INHALER: 108 (90 BAS | 16 days supply | Qty: 18 | Fill #0

## 2019-03-26 NOTE — Telephone Encounter (Signed)
New Message   1) Medication(s) Requested (by name): albuterol (VENTOLIN HFA) 108 (90 Base) MCG/ACT inhaler  2) Pharmacy of Choice: [facility]  3) Special Requests: Pt made an appt and want to know if she can get a temporary fill   Approved medications will be sent to the pharmacy, we will reach out if there is an issue.  Requests made after 3pm may not be addressed until the following business day!  If a patient is unsure of the name of the medication(s) please note and ask patient to call back when they are able to provide all info, do not send to responsible party until all information is available!

## 2019-03-26 NOTE — Telephone Encounter (Signed)
Refill sent for 1 month supply

## 2019-03-29 ENCOUNTER — Telehealth: Payer: Self-pay | Admitting: Internal Medicine

## 2019-03-29 DIAGNOSIS — J4540 Moderate persistent asthma, uncomplicated: Secondary | ICD-10-CM

## 2019-03-29 MED ORDER — FLUTICASONE FUROATE-VILANTEROL 100-25 MCG/INH IN AEPB: INHALATION_SPRAY | RESPIRATORY_TRACT | 0 refills | Status: DC

## 2019-03-29 MED FILL — $BREO ELLIPTA 100-25 MCG IH: 100-25 MCG | 30 days supply | Qty: 60 | Fill #0

## 2019-03-29 NOTE — Telephone Encounter (Signed)
Refill sent for 1 month

## 2019-03-29 NOTE — Telephone Encounter (Signed)
1) Medication(s) Requested (by name):fluticasone furoate-vilanterol (BREO ELLIPTA) 100-25 MCG/INH AEPB [[id number]]    2) Pharmacy of Choice:Pharmacy:  [facility] [place]  3) Special Requests: Patient needs it to last until appointment   Approved medications will be sent to the pharmacy, we will reach out if there is an issue.  Requests made after 3pm may not be addressed until the following business day!  If a patient is unsure of the name of the medication(s) please note and ask patient to call back when they are able to provide all info, do not send to responsible party until all information is available!

## 2019-04-05 ENCOUNTER — Other Ambulatory Visit: Payer: Self-pay

## 2019-04-05 ENCOUNTER — Emergency Department (HOSPITAL_COMMUNITY)
Admission: EM | Admit: 2019-04-05 | Discharge: 2019-04-06 | Disposition: A | Discharge status: Home / Self Care | Payer: Self-pay | Attending: Emergency Medicine | Admitting: Emergency Medicine

## 2019-04-05 ENCOUNTER — Encounter (HOSPITAL_COMMUNITY): Payer: Self-pay

## 2019-04-05 DIAGNOSIS — N3001 Acute cystitis with hematuria: Secondary | ICD-10-CM | POA: Insufficient documentation

## 2019-04-05 DIAGNOSIS — J45909 Unspecified asthma, uncomplicated: Secondary | ICD-10-CM | POA: Insufficient documentation

## 2019-04-05 DIAGNOSIS — Z87891 Personal history of nicotine dependence: Secondary | ICD-10-CM | POA: Insufficient documentation

## 2019-04-05 LAB — URINALYSIS, ROUTINE W REFLEX MICROSCOPIC
Bilirubin Urine: NEGATIVE
Glucose, UA: NEGATIVE mg/dL
Ketones, ur: NEGATIVE mg/dL
Nitrite: NEGATIVE
Protein, ur: 30 mg/dL — AB
Specific Gravity, Urine: 1.01 (ref 1.005–1.030)
pH: 8.5 — ABNORMAL HIGH (ref 5.0–8.0)

## 2019-04-05 LAB — URINALYSIS, MICROSCOPIC (REFLEX)

## 2019-04-05 MED ORDER — OXYCODONE-ACETAMINOPHEN 5-325 MG PO TABS
1.0000 | ORAL_TABLET | Freq: Once | ORAL | Status: AC
  Administered 2019-04-05: 1 via ORAL
  Filled 2019-04-05: qty 1

## 2019-04-05 MED ORDER — CEPHALEXIN 250 MG PO CAPS
500.0000 mg | ORAL_CAPSULE | Freq: Once | ORAL | Status: AC
  Administered 2019-04-05: 500 mg via ORAL
  Filled 2019-04-05: qty 2

## 2019-04-05 MED ORDER — KETOROLAC TROMETHAMINE 30 MG/ML IJ SOLN: 30.0000 mg | Freq: Once | INTRAMUSCULAR | Status: DC

## 2019-04-05 MED ORDER — ACETAMINOPHEN 325 MG PO TABS
650.0000 mg | ORAL_TABLET | Freq: Once | ORAL | Status: AC
  Administered 2019-04-05: 650 mg via ORAL
  Filled 2019-04-05: qty 2

## 2019-04-05 NOTE — ED Triage Notes (Signed)
Pt presents with lower back pain x4 days, pt reports she fell off the bed last Wednesday. Pt now with pelvic pain and burning with urination. Denies vaginal odor or d/c

## 2019-04-05 NOTE — ED Provider Notes (Signed)
[facility]   CSN: [id number] Arrival date & time: 04/05/19  1130     History   Chief Complaint Chief Complaint  Patient presents with  . Back Pain  . Urinary Tract Infection    HPI Catherine Porter is a 28 y.o. female.     HPI  This is a 28 year old female with a history asthma who presents with back pain and urinary symptoms.  Patient reports that she has had some lower back pain and pressure with urination since Thursday.  She denies overt dysuria.  She states that the back pain bilateral and achy in nature.  She states that it is not necessarily worse with movement.  Nothing seems to make it better or worse.  She has not taken anything at home.  She describes pressure with urination.  Denies any fevers or recent sick contacts.  Currently she rates her pain 8 out of 10.  Past Medical History:  Diagnosis Date  . Arthritis    "fingers" (06/20/2015)  . Asthma   . Gestational HTN 2012   when pregnant with son   . Migraine    "not often anymore" (06/20/2015)    Patient Active Problem List   Diagnosis Date Noted  . 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 Pelvic pain in female 07/17/2015  . Persistent asthma 03/30/2015    Past Surgical History:  Procedure Laterality Date  . LAPAROSCOPIC CHOLECYSTECTOMY  2014  . TONSILLECTOMY AND ADENOIDECTOMY  ~ 2008  . TONSILLECTOMY/ADENOIDECTOMY/TURBINATE REDUCTION  ~ 2008     OB History    Gravida  2   Para  2   Term  2   Preterm  0   AB  0   Living  2     SAB  0   TAB  0   Ectopic  0   Multiple  0   Live Births  2        Obstetric Comments  2 children, one boy and girl         Home Medications    Prior to Admission medications   Medication Sig Start Date End Date Taking? Authorizing Provider  albuterol (VENTOLIN HFA) 108 (90 Base) MCG/ACT inhaler Inhale 2 puffs into the lungs every 4 (four) hours as needed for wheezing or shortness of breath. Must have  office visit for refills 03/26/19   Marcine MatarJohnson, Deborah B, MD  azithromycin [facility](ZITHROMAX) 250 MG tablet Four tablets PO x 1 07/22/18   Marcine MatarJohnson, Deborah B, MD  cephALEXin (KEFLEX) 500 MG capsule Take 1 capsule (500 mg total) by mouth 3 (three) times daily. 04/06/19   Cal Gindlesperger, Mayer Maskerourtney F, MD  fluticasone furoate-vilanterol (BREO ELLIPTA) 100-25 MCG/INH AEPB Inhale 1 puff into the lungs daily. Must have office visit for refills 03/29/19   Marcine MatarJohnson, Deborah B, MD  naproxen (NAPROSYN) 500 MG tablet Take 1 tablet (500 mg total) by mouth 2 (two) times daily. 04/06/19   Elber Galyean, Mayer Maskerourtney F, MD    Family History Family History  Problem Relation Age of Onset  . Anesthesia problems Neg Hx   . Asthma Neg Hx     Social History Social History   Tobacco Use  . Smoking status: Former Smoker    Packs/day: 0.00    Years: 4.00    Pack years: 0.00    Types: Cigarettes    Quit date: 10/06/2013    Years since quitting: 5.5  . Smokeless tobacco: Never Used  Substance Use Topics  .  Alcohol use: Yes    Comment: socially  . Drug use: No     Allergies   Patient has no known allergies.   Review of Systems Review of Systems  Constitutional: Negative for fever.  Respiratory: Negative for shortness of breath.   Cardiovascular: Negative for chest pain.  Gastrointestinal: Negative for abdominal pain, nausea and vomiting.  Genitourinary: Negative for dysuria, flank pain and hematuria.       Pressure with urination  Musculoskeletal: Positive for back pain.  All other systems reviewed and are negative.    Physical Exam Updated Vital Signs BP (!) 147/113 (BP Location: Right Arm)   Pulse 74   Temp 98.8 F (37.1 C) (Oral)   Resp 16   Ht 1.626 m (5\' 4" )   SpO2 100%   BMI 38.45 kg/m   Physical Exam Vitals signs and nursing note reviewed.  Constitutional:      Appearance: She is well-developed. She is obese. She is not ill-appearing.  HENT:     Head: Normocephalic and atraumatic.  Eyes:     Pupils:  Pupils are equal, round, and reactive to light.  Neck:     Musculoskeletal: Neck supple.  Cardiovascular:     Rate and Rhythm: Normal rate and regular rhythm.     Heart sounds: Normal heart sounds.  Pulmonary:     Effort: Pulmonary effort is normal. No respiratory distress.     Breath sounds: No wheezing.  Abdominal:     Palpations: Abdomen is soft.     Tenderness: There is no abdominal tenderness. There is no right CVA tenderness or left CVA tenderness.  Musculoskeletal:        General: No tenderness.     Comments: No midline L-spine tenderness to palpation, step-off, or deformity  Skin:    General: Skin is warm and dry.  Neurological:     Mental Status: She is alert and oriented to person, place, and time.  Psychiatric:        Mood and Affect: Mood normal.      ED Treatments / Results  Labs (all labs ordered are listed, but only abnormal results are displayed) Labs Reviewed  URINALYSIS, ROUTINE W REFLEX MICROSCOPIC - Abnormal; Notable for the following components:      Result Value   pH 8.5 (*)    Hgb urine dipstick SMALL (*)    Protein, ur 30 (*)    Leukocytes,Ua TRACE (*)    All other components within normal limits  URINALYSIS, MICROSCOPIC (REFLEX) - Abnormal; Notable for the following components:   Bacteria, UA FEW (*)    All other components within normal limits  URINE CULTURE  I-STAT BETA HCG BLOOD, ED ([facility], [facility], [facility] ONLY)    EKG None  Radiology No results found.  Procedures Procedures (including critical care time)  Medications Ordered in ED Medications  ketorolac (TORADOL) 30 MG/ML injection 30 mg (has no administration in time range)  oxyCODONE-acetaminophen (PERCOCET/ROXICET) 5-325 MG per tablet 1 tablet (1 tablet Oral Given 04/05/19 1747)  cephALEXin (KEFLEX) capsule 500 mg (500 mg Oral Given 04/05/19 2359)  acetaminophen (TYLENOL) tablet 650 mg (650 mg Oral Given 04/05/19 2358)     Initial Impression / Assessment and Plan / ED Course  I have  reviewed the triage vital signs and the nursing notes.  Pertinent labs & imaging results that were available during my care of the patient were reviewed by me and considered in my medical decision making (see chart for details).  Patient presents with urinary pressure and back pain.  It does not lateralize.  She is overall nontoxic and vital signs are notable for blood pressure of 147/113.  No CVA tenderness or fever.  Urinalysis does suggest a urinary tract infection with 21-50 white cells and few bacteria.  Urine culture was sent.  Patient was given Tylenol and Toradol for pain.  Denies concerns for STDs.  Will treat with Keflex and have her follow-up with her primary physician.  She was given return precautions regarding progressive symptoms including worsening pain, fevers, nausea vomiting.  After history, exam, and medical workup I feel the patient has been appropriately medically screened and is safe for discharge home. Pertinent diagnoses were discussed with the patient. Patient was given return precautions.   Final Clinical Impressions(s) / ED Diagnoses   Final diagnoses:  Acute cystitis with hematuria    ED Discharge Orders         Ordered    cephALEXin (KEFLEX) 500 MG capsule  3 times daily     04/06/19 0039    naproxen (NAPROSYN) 500 MG tablet  2 times daily     04/06/19 0039           Sue Fernicola, Barbette Hair, MD 04/06/19 [phone]

## 2019-04-06 LAB — I-STAT BETA HCG BLOOD, ED (MC, WL, AP ONLY): I-stat hCG, quantitative: <5 m[IU]/mL (ref <5)

## 2019-04-06 MED ORDER — NAPROXEN 500 MG PO TABS: 500.0000 mg | ORAL_TABLET | Freq: Two times a day (BID) | ORAL | 0 refills | Status: DC

## 2019-04-06 MED ORDER — CEPHALEXIN 500 MG PO CAPS: 500.0000 mg | ORAL_CAPSULE | Freq: Three times a day (TID) | ORAL | 0 refills | Status: DC

## 2019-04-06 MED ORDER — KETOROLAC TROMETHAMINE 30 MG/ML IJ SOLN
30.0000 mg | Freq: Once | INTRAMUSCULAR | Status: AC
  Administered 2019-04-06: 30 mg via INTRAMUSCULAR
  Filled 2019-04-06: qty 1

## 2019-04-06 MED FILL — CEPHALEXIN 500 MG CAPSULE: 500 | 7 days supply | Qty: 21 | Fill #0

## 2019-04-06 MED FILL — NAPROXEN 500 MG TABLET: 500 | 15 days supply | Qty: 30 | Fill #0

## 2019-04-06 NOTE — ED Notes (Signed)
Reviewed discharge instructions with pt.  Pt verbalized understanding

## 2019-04-06 NOTE — Discharge Instructions (Addendum)
You were seen today for urinary symptoms and back pain.  You have evidence of a urinary tract infection.  Take medications as prescribed.  Follow-up with your primary physician.  If you develop worsening unilateral back pain, fevers, nausea, vomiting, you should be reevaluated.

## 2019-04-07 LAB — URINE CULTURE: Culture: >=100000 — AB

## 2019-04-08 ENCOUNTER — Telehealth (HOSPITAL_BASED_OUTPATIENT_CLINIC_OR_DEPARTMENT_OTHER): Payer: Self-pay

## 2019-04-08 NOTE — Telephone Encounter (Signed)
Post ED Visit - Positive Culture Follow-up  Culture report reviewed by antimicrobial stewardship pharmacist: [place] Team []  Elenor Quinones, Pharm.D. []  Heide Guile, Pharm.D., BCPS AQ-ID []  Parks Neptune, Pharm.D., BCPS []  Alycia Rossetti, Pharm.D., BCPS []  [place], [place].D., BCPS, AAHIVP []  Legrand Como, Pharm.D., BCPS, AAHIVP []  Salome Arnt, PharmD, BCPS []  Johnnette Gourd, PharmD, BCPS []  Hughes Better, PharmD, BCPS []  Leeroy Cha, PharmD []  Laqueta Linden, PharmD, BCPS []  Albertina Parr, PharmD Velia Meyer B. 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  Catherine Porter 04/08/2019, 10:56 AM

## 2019-04-26 ENCOUNTER — Telehealth: Payer: Self-pay | Admitting: Internal Medicine

## 2019-04-26 DIAGNOSIS — J4540 Moderate persistent asthma, uncomplicated: Secondary | ICD-10-CM

## 2019-04-26 MED ORDER — FLUTICASONE FUROATE-VILANTEROL 100-25 MCG/INH IN AEPB: INHALATION_SPRAY | RESPIRATORY_TRACT | 0 refills | Status: DC

## 2019-04-26 MED ORDER — ALBUTEROL SULFATE HFA 108 (90 BASE) MCG/ACT IN AERS: 2.0000 | INHALATION_SPRAY | RESPIRATORY_TRACT | 0 refills | Status: DC | PRN

## 2019-04-26 MED FILL — $VENTOLIN HFA 18G INHALER: 108 (90 BAS | 16 days supply | Qty: 18 | Fill #0

## 2019-04-26 MED FILL — !BREO ELLIPTA 100-25 MCG IN: 100-25 | 30 days supply | Qty: 60 | Fill #0

## 2019-04-26 NOTE — Telephone Encounter (Signed)
1) Medication(s) Requested (by name): -albuterol (VENTOLIN HFA) 108 (90 Base) MCG/ACT inhaler -fluticasone furoate-vilanterol (BREO ELLIPTA) 100-25 MCG/INH AEPB   2) Pharmacy of Choice: -[facility] [place] [address]  3) Special Requests: -Pt needs enough until 05/03/2019

## 2019-05-03 ENCOUNTER — Ambulatory Visit: Payer: Self-pay | Attending: Internal Medicine | Admitting: Internal Medicine

## 2019-05-03 ENCOUNTER — Encounter: Payer: Self-pay | Admitting: Internal Medicine

## 2019-05-03 ENCOUNTER — Other Ambulatory Visit: Payer: Self-pay

## 2019-05-03 DIAGNOSIS — J4540 Moderate persistent asthma, uncomplicated: Secondary | ICD-10-CM

## 2019-05-03 DIAGNOSIS — Z23 Encounter for immunization: Secondary | ICD-10-CM

## 2019-05-03 MED ORDER — FLUTICASONE FUROATE-VILANTEROL 100-25 MCG/INH IN AEPB: INHALATION_SPRAY | RESPIRATORY_TRACT | 12 refills | Status: DC

## 2019-05-03 MED ORDER — ALBUTEROL SULFATE HFA 108 (90 BASE) MCG/ACT IN AERS: 2.0000 | INHALATION_SPRAY | RESPIRATORY_TRACT | 12 refills | Status: DC | PRN

## 2019-05-03 NOTE — Progress Notes (Signed)
Virtual Visit via Telephone Note Due to current restrictions/limitations of in-office visits due to the COVID-19 pandemic, this scheduled clinical appointment was converted to a telehealth visit  I connected with Catherine Porter on 05/03/19 at 3:13 p.m by telephone and verified that I am speaking with the correct person using two identifiers. I am in my office.  The patient is at home.  Only the patient and myself participated in this encounter.  I discussed the limitations, risks, security and privacy concerns of performing an evaluation and management service by telephone and the availability of in person appointments. I also discussed with the patient that there may be a patient responsible charge related to this service. The patient expressed understanding and agreed to proceed.   History of Present Illness: Pt with hx of asthma.  Asthma:  Request  RF on Breo and Albuterol.  Going well except 2 wks ago when weather got very cold.  Had mild flare at that time.  -using Breo once a day.  Uses Albuterol about 2 x wk; more when she is having flare.  -no coughing, wheezing or SOB at this time.  No fever    Observations/Objective:  No direct observation done   Assessment and Plan: 1. Moderate persistent asthma without complication Good control on current regiment Avoid being around persons with acute resp illness - albuterol (VENTOLIN HFA) 108 (90 Base) MCG/ACT inhaler; Inhale 2 puffs into the lungs every 4 (four) hours as needed for wheezing or shortness of breath.  Dispense: 18 g; Refill: 12 - fluticasone furoate-vilanterol (BREO ELLIPTA) 100-25 MCG/INH AEPB; Inhale 1 puff into the lungs daily.  Dispense: 60 each; Refill: 12  2. Need for influenza vaccination Will come next week for nurse only visit   Follow Up Instructions: PRN   I discussed the assessment and treatment plan with the patient. The patient was provided an opportunity to ask questions and all were answered. The  patient agreed with the plan and demonstrated an understanding of the instructions.   The patient was advised to call back or seek an in-person evaluation if the symptoms worsen or if the condition fails to improve as anticipated.  I provided 4 minutes of non-face-to-face time during this encounter.   Karle Plumber, MD

## 2019-05-10 ENCOUNTER — Ambulatory Visit: Payer: Self-pay | Admitting: Pharmacist

## 2019-06-08 MED FILL — !BREO ELLIPTA 100-25 MCG IN: 100-25 | 30 days supply | Qty: 60 | Fill #0

## 2019-06-08 MED FILL — !VENTOLIN HFA INHALER: 108 (90 BAS | 16 days supply | Qty: 18 | Fill #0

## 2019-07-30 MED FILL — !VENTOLIN HFA INHALER: 108 (90 BAS | 16 days supply | Qty: 18 | Fill #1

## 2019-07-30 MED FILL — !BREO ELLIPTA 100-25 MCG IN: 100-25 | 30 days supply | Qty: 60 | Fill #1

## 2019-09-30 MED FILL — !BREO ELLIPTA 100-25 MCG IN: 100-25 | 30 days supply | Qty: 60 | Fill #2

## 2019-09-30 MED FILL — ALBUTEROL SULFATE HFA 108 (: 108 (90 BAS | 16 days supply | Qty: 18 | Fill #2

## 2019-12-01 MED FILL — BREO ELLIPTA 100-25 MCG INH: 100-25 | 30 days supply | Qty: 60 | Fill #3

## 2019-12-01 MED FILL — ALBUTEROL SULFATE HFA 108 (: 108 (90 BAS | 16 days supply | Qty: 18 | Fill #3

## 2020-01-24 MED FILL — $BREO ELLIPTA 100-25 MCG IH: 100-25 MCG | 90 days supply | Qty: 180 | Fill #4

## 2020-01-24 MED FILL — ALBUTEROL SULFATE HFA 108 (: 108 (90 BAS | 16 days supply | Qty: 18 | Fill #4

## 2020-04-24 MED FILL — $BREO ELLIPTA 100-25 MCG IH: 100-25 MCG | 90 days supply | Qty: 180 | Fill #5

## 2020-08-17 ENCOUNTER — Other Ambulatory Visit: Payer: Self-pay | Admitting: Internal Medicine

## 2020-08-17 DIAGNOSIS — J4540 Moderate persistent asthma, uncomplicated: Secondary | ICD-10-CM

## 2020-08-17 MED FILL — ALBUTEROL SULFATE HFA 108 (: 108 (90 BAS | 25 days supply | Qty: 18 | Fill #0

## 2020-08-17 NOTE — Telephone Encounter (Signed)
Requested medication (s) are due for refill today: yes  Requested medication (s) are on the active medication list: yes  Last refill: 05/03/19  Future visit scheduled:yes  Notes to clinic: no assigned protocol  Requested Prescriptions  Pending Prescriptions Disp Refills   fluticasone furoate-vilanterol (BREO ELLIPTA) 100-25 MCG/INH AEPB 60 each 12    Sig: Inhale 1 puff into the lungs daily.      Pulmonology:  Combination Products Failed - 08/17/2020 11:55 AM      Failed - Valid encounter within last 12 months    Recent Outpatient Visits           1 year ago Moderate persistent asthma without complication   [place] [facility] Marcine Matar, MD   2 years ago Secondary amenorrhea   [facility] Marcine Matar, MD   2 years ago Moderate persistent asthma without complication   [place] [facility] Marcine Matar, MD   3 years ago Insomnia, unspecified type   [facility] Marcine Matar, MD   5 years ago Healthcare maintenance   [facility] [place], [place], MD                 Signed Prescriptions Disp Refills   VENTOLIN HFA 108 (90 Base) MCG/ACT inhaler 18 g 0    Sig: INHALE 2 PUFFS INTO THE LUNGS EVERY 4 (FOUR) HOURS AS NEEDED FOR WHEEZING OR SHORTNESS OF BREATH. MUST HAVE VISIT - LAST FILL!      Pulmonology:  Beta Agonists Failed - 08/17/2020 11:30 AM      Failed - One inhaler should last at least one month. If the patient is requesting refills earlier, contact the patient to check for uncontrolled symptoms.      Failed - Valid encounter within last 12 months    Recent Outpatient Visits           1 year ago Moderate persistent asthma without complication   [facility] Marcine Matar, MD   2 years ago Secondary amenorrhea   [facility] [facility] Marcine Matar, MD    2 years ago Moderate persistent asthma without complication   [place] [facility] Marcine Matar, MD   3 years ago Insomnia, unspecified type   [facility] Marcine Matar, MD   5 years ago Healthcare maintenance   [facility] Dessa Phi, MD

## 2020-08-17 NOTE — Telephone Encounter (Signed)
Refill request for Ventolin inhaler last filled 04/26/2019 with pharmacy note "MUST HAVE VISIT - LAST FILL!";  Pt notified; decision tree completed; the pt normally sees DR Jonah Blue but she has no avaiilability until May 5. 2022; the pt would like to be seen sooner; she was offered and accepted virtual appt with Dr Delford Field on 09/19/20 at 1600; will route to office for notification.  Requested medication (s) are due for refill today: yes  Requested medication (s) are on the active medication list: yes  Last refill: 05/03/2019  Future visit scheduled: yes  Notes to clinic:  No assigned protocol     Requested Prescriptions  Pending Prescriptions Disp Refills  . VENTOLIN HFA 108 (90 Base) MCG/ACT inhaler [Pharmacy Med Name: VENTOLIN HFA 18G INHALER 108 (90 BAS Aerosol] 18 g 0    Sig: INHALE 2 PUFFS INTO THE LUNGS EVERY 4 (FOUR) HOURS AS NEEDED FOR WHEEZING OR SHORTNESS OF BREATH. MUST HAVE VISIT - LAST FILL!     Pulmonology:  Beta Agonists Failed - 08/17/2020 11:30 AM      Failed - One inhaler should last at least one month. If the patient is requesting refills earlier, contact the patient to check for uncontrolled symptoms.      Failed - Valid encounter within last 12 months    Recent Outpatient Visits          1 year ago Moderate persistent asthma without complication   [place] [facility] Marcine Matar, MD   2 years ago Secondary amenorrhea   [place] [facility] Marcine Matar, MD   2 years ago Moderate persistent asthma without complication   [place] [facility] Marcine Matar, MD   3 years ago Insomnia, unspecified type   [facility] [facility] Marcine Matar, MD   5 years ago Healthcare maintenance   [place] [facility] [place], [place], MD             . fluticasone furoate-vilanterol (BREO ELLIPTA) 100-25 MCG/INH AEPB 60 each 12    Sig: Inhale  1 puff into the lungs daily.     There is no refill protocol information for this order

## 2020-09-07 ENCOUNTER — Ambulatory Visit: Payer: Self-pay | Admitting: *Deleted

## 2020-09-07 NOTE — Telephone Encounter (Signed)
Contacted pt and made aware to be waiting for a call to schedule a virtual appt so that she can get her inhalers refilled. Pt states she understands. Will forward message to Dorene Grebe to reach out to pt to schedule

## 2020-09-07 NOTE — Telephone Encounter (Signed)
Shortness of breath starting at least a month ago; she was running low on her Virgel Bouquet and has been using it every other day; she has been out of the medication 3 days; her last dose was 09/04/20; she has been using her Albuterol inhaler every 1-2 hrs; she says she has been wheezing and struggling to catch her breath; she has shortness of breath at rest; she also has a dry cough; recommendations made per nurse triage protocol; the pt would like to be seen in the office; attempted to contact office flow coordinator twice but line was busy; pt also advised that Virgel Bouquet is a maintenance med and since she is actively having symptoms, she requires prompt evaluation since and should not wait for a response from the office; she verbalized understanding and will go to the ED; she sees Dr Jonah [facility]; will rotue to office for notification; the pt would also like a callback; she can be contacted at [phone]  Reason for Disposition . [1] MODERATE difficulty breathing (e.g., speaks in phrases, SOB even at rest, pulse 100-120) AND [2] NEW-onset or WORSE than normal  Answer Assessment - Initial Assessment Questions 1. RESPIRATORY STATUS: "Describe your breathing?" (e.g., wheezing, shortness of breath, unable to speak, severe coughing)      Shortness of breath 2. ONSET: "When did this breathing problem begin?"     At least a month ago 3. PATTERN "Does the difficult breathing come and go, or has it been constant since it started?"      Worsening since using Breo inhaler every other day 4. SEVERITY: "How bad is your breathing?" (e.g., mild, moderate, severe)    - MILD: No SOB at rest, mild SOB with walking, speaks normally in sentences, can lay down, no retractions, pulse < 100.    - MODERATE: SOB at rest, SOB with minimal exertion and prefers to sit, cannot lie down flat, speaks in phrases, mild retractions, audible wheezing, pulse 100-120.    - SEVERE: Very SOB at rest, speaks in  single words, struggling to breathe, sitting hunched forward, retractions, pulse > 120      Moderate to severe 5. RECURRENT SYMPTOM: "Have you had difficulty breathing before?" If Yes, ask: "When was the last time?" and "What happened that time?"   yes history of asthma 6. CARDIAC HISTORY: "Do you have any history of heart disease?" (e.g., heart attack, angina, bypass surgery, angioplasty)     HTN during pregnancy 7. LUNG HISTORY: "Do you have any history of lung disease?"  (e.g., pulmonary embolus, asthma, emphysema)     asthma 8. CAUSE: "What do you think is causing the breathing problem?"     ashtma 9. OTHER SYMPTOMS: "Do you have any other symptoms? (e.g., dizziness, runny nose, cough, chest pain, fever)   Wheezing, dry cough   10. PREGNANCY: "Is there any chance you are pregnant?" "When was your last menstrual period?"       No. irregular periods, LMP 07/11/20  11. TRAVEL: "Have you traveled out of the country in the last month?" (e.g., travel history, exposures)  Protocols used: BREATHING DIFFICULTY-A-AH

## 2020-09-08 ENCOUNTER — Other Ambulatory Visit: Payer: Self-pay | Admitting: Nurse Practitioner

## 2020-09-08 ENCOUNTER — Telehealth (INDEPENDENT_AMBULATORY_CARE_PROVIDER_SITE_OTHER): Payer: Self-pay | Admitting: Nurse Practitioner

## 2020-09-08 DIAGNOSIS — J4540 Moderate persistent asthma, uncomplicated: Secondary | ICD-10-CM

## 2020-09-08 MED ORDER — FLUTICASONE FUROATE-VILANTEROL 100-25 MCG/INH IN AEPB: INHALATION_SPRAY | RESPIRATORY_TRACT | 12 refills | Status: DC

## 2020-09-08 MED ORDER — ALBUTEROL SULFATE HFA 108 (90 BASE) MCG/ACT IN AERS: INHALATION_SPRAY | RESPIRATORY_TRACT | 0 refills | Status: DC

## 2020-09-08 MED FILL — BREO ELLIPTA 100-25 MCG INH: 100-25 | 30 days supply | Qty: 60 | Fill #0

## 2020-09-08 MED FILL — ALBUTEROL SULFATE HFA 108 (: 108 (90 BAS | 30 days supply | Qty: 18 | Fill #0

## 2020-09-08 NOTE — Patient Instructions (Addendum)
Asthma:  Will refill inhalers  Take as directed  Call with any worsening symptoms   Asthma Action Plan, Adult An asthma action plan helps you understand how to manage your asthma and what to do when you have an asthma attack. The action plan is a color-coded plan that lists the symptoms that indicate whether or not your condition is under control and what actions to take.  If you have symptoms in the green zone, it means you are doing well.  If you have symptoms in the yellow zone, it means you are having problems.  If you have symptoms in the red zone, you need medical care right away. Follow the plan that you and your health care provider develop. Review your plan with your health care provider at each visit. What triggers your asthma? Knowing the things that can trigger an asthma attack or make your asthma symptoms worse is very important. Talk to your health care provider about your asthma triggers and how to avoid them. Record your known asthma triggers here: _______________ What is your personal best peak flow reading? If you use a peak flow meter, determine your personal best reading. Record it here: _______________ Red zone Symptoms in this zone mean that you should get medical help right away. You will likely feel distressed and have symptoms at rest that restrict your activity. You are in the red zone if:  You are breathing hard and quickly.  Your nose opens wide, your ribs show, and your neck muscles become visible when you breathe in.  Your lips, fingers, or toes are a bluish color.  You have trouble speaking in full sentences.  Your peak flow reading is less than __________ (less than 50% of your personal best).  Your symptoms do not improve within 15-20 minutes after you use your reliever or rescue medicine (bronchodilator). If you have any of these symptoms:  Call your local emergency services (911 in the [place] or go to the nearest emergency room.  Use your  reliever or rescue medicine. ? Start a nebulizer treatment or take 2-4 puffs from a metered-dose inhaler with a spacer. ? Repeat this action every 15-20 minutes until help arrives.   Yellow zone Symptoms in this zone mean that your condition may be getting worse. You may have symptoms that interfere with exercise, are noticeably worse after exposure to triggers, or are worse at the first sign of a cold (upper respiratory infection). These may include:  Waking from sleep.  Coughing, especially at night or first thing in the morning.  Mild wheezing.  Chest tightness.  A peak flow reading that is __________ to __________ (50-79% of your personal best). If you have any of these symptoms:  Add the following medicine to the ones that you use daily: ? Reliever or rescue medicine and dosage: _______________ ? Additional medicine and dosage: _______________ Call your health care provider if:  You remain in the yellow zone for __________ hours.  You are using a reliever or rescue medicine more than 2-3 times a week.   Green zone This zone means that your asthma is under control. You may not have any symptoms while you are in the green zone. This means that you:  Have no coughing or wheezing, even while you are working or playing.  Sleep through the night.  Are breathing well.  Have a peak flow reading that is above __________ (80% of your personal best or greater). If you are in the green zone, continue to  manage your asthma as directed:  Take these medicines every day: ? Controller medicine and dosage: _______________ ? Controller medicine and dosage: _______________ ? Controller medicine and dosage: _______________ ? Controller medicine and dosage: _______________  Before exercise, use this reliever or rescue medicine: _______________ Call your health care provider if you are using a reliever or rescue medicine more than 2-3 times a week.   Where to find more information You can  find more information about asthma from:  Centers for Disease Control and Prevention: SendThoughts.com.pt  American Lung Association: www.lung.org This information is not intended to replace advice given to you by your health care provider. Make sure you discuss any questions you have with your health care provider. Document Revised: 09/21/2019 Document Reviewed: 09/21/2019 Elsevier Patient Education  2021 [employer].

## 2020-09-08 NOTE — Progress Notes (Signed)
Virtual Visit via Telephone Note  I connected with Catherine Porter on 09/08/20 at 10:30 AM EDT by telephone and verified that I am speaking with the correct person using two identifiers.  Location: Patient: home Provider: office   I discussed the limitations, risks, security and privacy concerns of performing an evaluation and management service by telephone and the availability of in person appointments. I also discussed with the patient that there may be a patient responsible charge related to this service. The patient expressed understanding and agreed to proceed.   History of Present Illness:   Patient presents today for medication refill.  She was last seen by her PCP Jonah Blue and 05/03/2019.  She needs refills on her inhalers today.  She does have a history of asthma and has been without her inhalers for the past 4 days.  She states that she is experiencing shortness of breath due to to being out of medications.  This is happened to her in the past as well.  She does not feel that she is having an asthma flare at this time. Denies f/c/s, n/v/d, hemoptysis, PND, leg swelling.      Observations/Objective:   Vitals with BMI 04/06/2019 04/05/2019 04/05/2019  Height - - 5\' 4"   Weight - - -  BMI - - -  Systolic 137 147 -  Diastolic 92 113 -  Pulse 80 74 -      Assessment and Plan:  Asthma:  Will refill inhalers  Take as directed  Call with any worsening symptoms    Follow Up Instructions:  Will need 6 month follow up with PCP    I discussed the assessment and treatment plan with the patient. The patient was provided an opportunity to ask questions and all were answered. The patient agreed with the plan and demonstrated an understanding of the instructions.   The patient was advised to call back or seek an in-person evaluation if the symptoms worsen or if the condition fails to improve as anticipated.  I provided 23 minutes of non-face-to-face time during  this encounter.   , NP

## 2020-09-18 NOTE — Progress Notes (Deleted)
   Subjective:    Patient ID: Catherine Porter, female    DOB: [birth year], 30 y.o.   MRN: [id number]  30 y.o.F PCP Catherine Porter    09/19/20 Here for med refills Hx Asthma   Asthma Her past medical history is significant for asthma.      Review of Systems     Objective:   Physical Exam        Assessment & Plan:

## 2020-09-19 ENCOUNTER — Ambulatory Visit: Payer: Self-pay | Admitting: Critical Care Medicine

## 2020-09-19 ENCOUNTER — Other Ambulatory Visit: Payer: Self-pay

## 2020-10-02 ENCOUNTER — Other Ambulatory Visit: Payer: Self-pay | Admitting: Internal Medicine

## 2020-10-02 ENCOUNTER — Other Ambulatory Visit: Payer: Self-pay

## 2020-10-02 DIAGNOSIS — J4540 Moderate persistent asthma, uncomplicated: Secondary | ICD-10-CM

## 2020-10-02 MED FILL — Fluticasone Furoate-Vilanterol Aero Powd BA 100-25 MCG/ACT: RESPIRATORY_TRACT | 30 days supply | Qty: 60 | Fill #0 | Status: AC

## 2020-10-02 NOTE — Telephone Encounter (Signed)
   Notes to clinic:  medication filled by a historical provider  Review for refill   Requested Prescriptions  Pending Prescriptions Disp Refills   albuterol (VENTOLIN HFA) 108 (90 Base) MCG/ACT inhaler 18 g 0    Sig: INHALE 2 PUFFS INTO THE LUNGS EVERY 4 (FOUR) HOURS AS NEEDED FOR WHEEZING OR SHORTNESS OF BREATH. MUST HAVE VISIT - LAST FILL!      There is no refill protocol information for this order

## 2020-10-03 ENCOUNTER — Other Ambulatory Visit: Payer: Self-pay

## 2020-10-04 ENCOUNTER — Other Ambulatory Visit: Payer: Self-pay

## 2020-10-05 ENCOUNTER — Other Ambulatory Visit: Payer: Self-pay

## 2020-10-09 ENCOUNTER — Other Ambulatory Visit: Payer: Self-pay

## 2020-10-12 ENCOUNTER — Other Ambulatory Visit: Payer: Self-pay

## 2020-11-10 ENCOUNTER — Emergency Department (HOSPITAL_COMMUNITY)
Admission: EM | Admit: 2020-11-10 | Discharge: 2020-11-10 | Discharge status: Against Medical Advice | Payer: Self-pay | Attending: Emergency Medicine | Admitting: Emergency Medicine

## 2020-11-10 ENCOUNTER — Other Ambulatory Visit: Payer: Self-pay | Admitting: Internal Medicine

## 2020-11-10 DIAGNOSIS — Z5321 Procedure and treatment not carried out due to patient leaving prior to being seen by health care provider: Secondary | ICD-10-CM | POA: Insufficient documentation

## 2020-11-10 DIAGNOSIS — J45901 Unspecified asthma with (acute) exacerbation: Secondary | ICD-10-CM | POA: Insufficient documentation

## 2020-11-10 DIAGNOSIS — J4540 Moderate persistent asthma, uncomplicated: Secondary | ICD-10-CM

## 2020-11-10 NOTE — Telephone Encounter (Signed)
   Notes to clinic: medication was filled by a different provider  Review for continue use and refill   Requested Prescriptions  Pending Prescriptions Disp Refills   albuterol (VENTOLIN HFA) 108 (90 Base) MCG/ACT inhaler 18 g 0    Sig: INHALE 2 PUFFS INTO THE LUNGS EVERY 4 (FOUR) HOURS AS NEEDED FOR WHEEZING OR SHORTNESS OF BREATH. MUST HAVE VISIT - LAST FILL!      Pulmonology:  Beta Agonists Failed - 11/10/2020 10:24 AM      Failed - One inhaler should last at least one month. If the patient is requesting refills earlier, contact the patient to check for uncontrolled symptoms.      Failed - Valid encounter within last 12 months    Recent Outpatient Visits           1 year ago Moderate persistent asthma without complication   [place] Marcine Matar, MD   2 years ago Secondary amenorrhea   [place] [facility] Marcine Matar, MD   2 years ago Moderate persistent asthma without complication   [place] [facility] Marcine Matar, MD   3 years ago Insomnia, unspecified type   [facility] Marcine Matar, MD   5 years ago Healthcare maintenance   [facility] Dessa Phi, MD       Future Appointments             In 3 weeks Marcine Matar, MD [facility]

## 2020-11-10 NOTE — Telephone Encounter (Signed)
Medication Refill - Medication: albuterol (VENTOLIN HFA) 108 (90 Base) MCG/ACT inhaler     Preferred Pharmacy (with phone number or street name): [facility]  Agent: Please be advised that RX refills may take up to 3 business days. We ask that you follow-up with your pharmacy.

## 2020-11-10 NOTE — ED Notes (Signed)
Patient left the facility

## 2020-11-10 NOTE — ED Provider Notes (Cosign Needed)
I went in to triage patient and she immediately told me that she had to leave due to something going on with her daughter.  She states that "I know I need to stay here to get treated but I really have to go take care of my daughter."  I informed her that she can return anytime to seek treatment and evaluation but I did urge her to stay today   Dietrich Pates, PA-C 11/10/20 2026

## 2020-11-14 ENCOUNTER — Other Ambulatory Visit: Payer: Self-pay

## 2020-11-14 MED FILL — Fluticasone Furoate-Vilanterol Aero Powd BA 100-25 MCG/ACT: RESPIRATORY_TRACT | 90 days supply | Qty: 180 | Fill #1 | Status: AC

## 2020-11-14 MED FILL — Fluticasone Furoate-Vilanterol Aero Powd BA 100-25 MCG/ACT: RESPIRATORY_TRACT | 30 days supply | Qty: 60 | Fill #1 | Status: CN

## 2020-11-20 ENCOUNTER — Other Ambulatory Visit: Payer: Self-pay

## 2020-11-29 ENCOUNTER — Telehealth: Payer: Self-pay | Admitting: Internal Medicine

## 2020-11-29 NOTE — Telephone Encounter (Signed)
Provider is virtual 12/04/2020 called Pt left Vm to call [phone] to make a new appt for physical

## 2020-12-04 ENCOUNTER — Encounter: Payer: Self-pay | Admitting: Internal Medicine

## 2020-12-05 ENCOUNTER — Encounter: Payer: Self-pay | Admitting: Internal Medicine

## 2020-12-19 ENCOUNTER — Ambulatory Visit: Payer: Self-pay | Admitting: Nurse Practitioner

## 2021-01-03 ENCOUNTER — Encounter: Payer: Self-pay | Admitting: Physician Assistant

## 2021-01-16 ENCOUNTER — Encounter: Payer: Self-pay | Admitting: Internal Medicine

## 2021-02-16 ENCOUNTER — Other Ambulatory Visit: Payer: Self-pay

## 2021-02-16 ENCOUNTER — Ambulatory Visit: Payer: Self-pay | Attending: Physician Assistant | Admitting: Internal Medicine

## 2021-02-16 ENCOUNTER — Encounter: Payer: Self-pay | Admitting: Internal Medicine

## 2021-02-16 VITALS — BP 132/93 | HR 71 | Resp 16 | Wt 225.0 lb

## 2021-02-16 DIAGNOSIS — J4540 Moderate persistent asthma, uncomplicated: Secondary | ICD-10-CM

## 2021-02-16 DIAGNOSIS — Z23 Encounter for immunization: Secondary | ICD-10-CM

## 2021-02-16 MED ORDER — ALBUTEROL SULFATE HFA 108 (90 BASE) MCG/ACT IN AERS
2.0000 | INHALATION_SPRAY | RESPIRATORY_TRACT | 6 refills | Status: DC | PRN
  Filled 2021-02-16: qty 18, 16d supply, fill #0
  Filled 2021-04-25: qty 18, 16d supply, fill #1
  Filled 2021-06-06: qty 18, 16d supply, fill #2
  Filled 2021-07-25: qty 18, 16d supply, fill #0
  Filled 2021-09-13: qty 18, 16d supply, fill #1
  Filled 2021-11-09: qty 18, 16d supply, fill #2
  Filled 2021-12-31: qty 18, 28d supply, fill #3

## 2021-02-16 MED ORDER — FLUTICASONE FUROATE-VILANTEROL 100-25 MCG/INH IN AEPB
1.0000 | INHALATION_SPRAY | Freq: Every day | RESPIRATORY_TRACT | 6 refills | Status: DC
  Filled 2021-02-16: qty 60, 30d supply, fill #0

## 2021-02-16 NOTE — Progress Notes (Signed)
Patient ID: Catherine Porter, female    DOB: Nov 22, 1990  MRN: [id number]  CC: Medication Refill   Subjective: Catherine Porter is a 30 y.o. female who presents for f/u asthma Her concerns today include:  Pt with hx of asthma.  Asthma: doing well with Breo.  Has not used Albuterol in a while but out of it.  No recent flares.   Quit smoking 2 yrs ago. Agrees to flu vaccine.  No COVID vaccines as yet.  States she just has not had time. 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   No current facility-administered medications on file prior to visit.    No Known Allergies  Social History   Socioeconomic History   Marital status: Legally Separated    Spouse name: Not on file   Number of children: Not on file   Years of education: Not on file   Highest education level: Not on file  Occupational History   Not on file  Tobacco Use   Smoking status: Former    Packs/day: 0.00    Years: 4.00    Pack years: 0.00    Types: Cigarettes    Quit date: 10/06/2013    Years since quitting: 7.3   Smokeless tobacco: Never  Vaping Use   Vaping Use: Never used  Substance and Sexual Activity   Alcohol use: Yes    Comment: socially   Drug use: No   Sexual activity: Yes    Birth control/protection: I.U.D.    Comment: IUD 2 years  Other Topics Concern   Not on file  Social History Narrative   Not on file   Social Determinants of Health   Financial Resource Strain: Not on file  Food Insecurity: Not on file  Transportation Needs: Not on file  Physical Activity: Not on file  Stress: Not on file  Social Connections: Not on file  Intimate Partner Violence: Not on file    Family History  Problem Relation Age of Onset   Anesthesia problems Neg Hx    Asthma Neg Hx     Past Surgical History:  Procedure Laterality  Date   LAPAROSCOPIC CHOLECYSTECTOMY  2014   TONSILLECTOMY AND ADENOIDECTOMY  ~ 2008   TONSILLECTOMY/ADENOIDECTOMY/TURBINATE REDUCTION  ~ 2008    ROS: Review of Systems Negative except as stated above  PHYSICAL EXAM: BP (!) 132/93   Pulse 71   Resp 16   Wt 225 lb (102.1 kg)   SpO2 97%   BMI 36.32 kg/m   Physical Exam  General appearance - alert, well appearing, obese young female and in no distress Mental status - normal mood, behavior, speech, dress, motor activity, and thought processes Chest - clear to auscultation, no wheezes, rales or rhonchi, symmetric air entry Heart - normal rate, regular rhythm, normal S1, S2, no murmurs, rubs, clicks or gallops Extremities - peripheral pulses normal, no pedal edema, no clubbing or cyanosis   CMP Latest Ref Rng & Units 07/04/2015 06/20/2015 03/30/2015  Glucose 65 - 99 mg/dL 175(Z) 025(E) 527(P)  BUN 6 - 20 mg/dL 10 12 6(L)  Creatinine 0.44 - 1.00 mg/dL 8.24 2.35 3.61  Sodium 135 - 145 mmol/L 139 138 141  Potassium 3.5 - 5.1 mmol/L 4.2 3.9 4.8  Chloride 101 - 111 mmol/L 103 105 103  CO2 22 -  32 mmol/L 25 23 25   Calcium 8.9 - 10.3 mg/dL 9.2 8.9 9.2  Total Protein 6.5 - 8.1 g/dL 6.3(L) - -  Total Bilirubin 0.3 - 1.2 mg/dL 0.5 - -  Alkaline Phos 38 - 126 U/L 64 - -  AST 15 - 41 U/L 17 - -  ALT 14 - 54 U/L 25 - -   Lipid Panel  No results found for: CHOL, TRIG, HDL, CHOLHDL, VLDL, LDLCALC, LDLDIRECT  CBC    Component Value Date/Time   WBC 13.2 (H) 07/04/2015 0226   RBC 4.63 07/04/2015 0226   HGB 14.2 07/04/2015 0226   HCT 42.5 07/04/2015 0226   PLT 257 07/04/2015 0226   MCV 91.8 07/04/2015 0226   MCH 30.7 07/04/2015 0226   MCHC 33.4 07/04/2015 0226   RDW 13.0 07/04/2015 0226   LYMPHSABS 4.2 (H) 06/21/2015 0824   MONOABS 0.6 06/21/2015 0824   EOSABS 0.9 (H) 06/21/2015 0824   BASOSABS 0.1 06/21/2015 0824    ASSESSMENT AND PLAN: 1. Moderate persistent asthma without complication Well-controlled on Breo.  Refills given on  inhalers.  Encourage patient to get COVID-19 vaccine. - albuterol (VENTOLIN HFA) 108 (90 Base) MCG/ACT inhaler; Inhale 2 puffs into the lungs every 4 (four) hours as needed for wheezing or shortness of breath.  Dispense: 18 g; Refill: 6 - fluticasone furoate-vilanterol (BREO ELLIPTA) 100-25 MCG/INH AEPB; INHALE 1 PUFF INTO THE LUNGS DAILY.  Dispense: 60 each; Refill: 6  2. Need for immunization against influenza - Flu Vaccine QUAD 63mo+IM (Fluarix, Fluzone & Alfiuria Quad PF)  Addendum: I note after patient had already left that her blood pressure was elevated.  I called and left a message on her cell phone informing her that the blood pressure was elevated and I would like to get her back in in 4 to 6 weeks to recheck.  Advised to limit the salt in the foods or salt adversely affects the blood pressure.  Message sent to our administrative pool for them to schedule an appointment in 4 to 6 weeks  Patient was given the opportunity to ask questions.  Patient verbalized understanding of the plan and was able to repeat key elements of the plan.   Orders Placed This Encounter  Procedures   Flu Vaccine QUAD 65mo+IM (Fluarix, Fluzone & Alfiuria Quad PF)     Requested Prescriptions   Signed Prescriptions Disp Refills   albuterol (VENTOLIN HFA) 108 (90 Base) MCG/ACT inhaler 18 g 6    Sig: Inhale 2 puffs into the lungs every 4 (four) hours as needed for wheezing or shortness of breath.   fluticasone furoate-vilanterol (BREO ELLIPTA) 100-25 MCG/INH AEPB 60 each 6    Sig: INHALE 1 PUFF INTO THE LUNGS DAILY.    Return if symptoms worsen or fail to improve.  5mo, MD, FACP

## 2021-02-16 NOTE — Patient Instructions (Signed)
Please get the COVID vaccines as discussed today.

## 2021-02-19 ENCOUNTER — Telehealth: Payer: Self-pay

## 2021-02-19 ENCOUNTER — Other Ambulatory Visit: Payer: Self-pay

## 2021-02-19 NOTE — Telephone Encounter (Signed)
-----   Message from Marcine Matar, MD sent at 02/16/2021  5:20 PM EDT ----- Please call and give f/u appt in 4-6 wks for recheck BP.

## 2021-02-19 NOTE — Telephone Encounter (Signed)
Pt has been scheduled and reminder has been mailed.  

## 2021-02-20 ENCOUNTER — Other Ambulatory Visit: Payer: Self-pay

## 2021-04-03 ENCOUNTER — Ambulatory Visit: Payer: Self-pay | Admitting: Internal Medicine

## 2021-04-25 ENCOUNTER — Other Ambulatory Visit: Payer: Self-pay | Admitting: Pharmacist

## 2021-04-25 ENCOUNTER — Other Ambulatory Visit: Payer: Self-pay

## 2021-04-25 MED ORDER — FLUTICASONE FUROATE-VILANTEROL 100-25 MCG/ACT IN AEPB
1.0000 | INHALATION_SPRAY | Freq: Every day | RESPIRATORY_TRACT | 11 refills | Status: DC
  Filled 2021-04-25: qty 60, 30d supply, fill #0
  Filled 2021-06-06: qty 60, 30d supply, fill #1
  Filled 2021-07-25: qty 60, 30d supply, fill #0
  Filled 2021-09-13: qty 60, 30d supply, fill #1
  Filled 2021-11-09: qty 60, 30d supply, fill #2
  Filled 2021-12-31: qty 60, 30d supply, fill #3
  Filled 2022-02-26: qty 60, 30d supply, fill #4
  Filled 2022-04-22: qty 60, 30d supply, fill #5

## 2021-04-26 ENCOUNTER — Other Ambulatory Visit: Payer: Self-pay

## 2021-05-29 ENCOUNTER — Other Ambulatory Visit: Payer: Self-pay

## 2021-06-06 ENCOUNTER — Other Ambulatory Visit: Payer: Self-pay

## 2021-06-08 ENCOUNTER — Other Ambulatory Visit: Payer: Self-pay

## 2021-06-19 ENCOUNTER — Other Ambulatory Visit: Payer: Self-pay

## 2021-07-25 ENCOUNTER — Other Ambulatory Visit: Payer: Self-pay

## 2021-09-12 ENCOUNTER — Other Ambulatory Visit: Payer: Self-pay

## 2021-09-13 ENCOUNTER — Other Ambulatory Visit: Payer: Self-pay

## 2021-09-14 ENCOUNTER — Other Ambulatory Visit: Payer: Self-pay

## 2021-09-25 ENCOUNTER — Other Ambulatory Visit (HOSPITAL_BASED_OUTPATIENT_CLINIC_OR_DEPARTMENT_OTHER): Payer: Self-pay

## 2021-11-09 ENCOUNTER — Other Ambulatory Visit: Payer: Self-pay

## 2021-11-15 ENCOUNTER — Other Ambulatory Visit: Payer: Self-pay

## 2021-12-31 ENCOUNTER — Other Ambulatory Visit: Payer: Self-pay

## 2022-02-01 ENCOUNTER — Other Ambulatory Visit: Payer: Self-pay

## 2022-02-25 ENCOUNTER — Other Ambulatory Visit: Payer: Self-pay

## 2022-02-26 ENCOUNTER — Other Ambulatory Visit: Payer: Self-pay

## 2022-02-27 ENCOUNTER — Other Ambulatory Visit: Payer: Self-pay

## 2022-03-19 ENCOUNTER — Other Ambulatory Visit: Payer: Self-pay

## 2022-04-16 ENCOUNTER — Other Ambulatory Visit: Payer: Self-pay

## 2022-04-22 ENCOUNTER — Other Ambulatory Visit: Payer: Self-pay | Admitting: Internal Medicine

## 2022-04-22 ENCOUNTER — Other Ambulatory Visit: Payer: Self-pay

## 2022-04-22 DIAGNOSIS — J4540 Moderate persistent asthma, uncomplicated: Secondary | ICD-10-CM

## 2022-04-25 ENCOUNTER — Other Ambulatory Visit: Payer: Self-pay

## 2022-06-06 ENCOUNTER — Other Ambulatory Visit: Payer: Self-pay

## 2022-06-06 ENCOUNTER — Other Ambulatory Visit: Payer: Self-pay | Admitting: Internal Medicine

## 2022-06-06 DIAGNOSIS — J4540 Moderate persistent asthma, uncomplicated: Secondary | ICD-10-CM

## 2022-06-06 MED ORDER — FLUTICASONE FUROATE-VILANTEROL 100-25 MCG/ACT IN AEPB
1.0000 | INHALATION_SPRAY | Freq: Every day | RESPIRATORY_TRACT | 1 refills | Status: DC
  Filled 2022-06-06: qty 120, 60d supply, fill #0

## 2022-06-06 MED ORDER — ALBUTEROL SULFATE HFA 108 (90 BASE) MCG/ACT IN AERS
2.0000 | INHALATION_SPRAY | RESPIRATORY_TRACT | 1 refills | Status: DC | PRN
  Filled 2022-06-06: qty 6.7, 17d supply, fill #0
  Filled 2022-09-25: qty 6.7, 17d supply, fill #1

## 2022-06-07 ENCOUNTER — Other Ambulatory Visit: Payer: Self-pay

## 2022-06-11 ENCOUNTER — Other Ambulatory Visit: Payer: Self-pay

## 2022-07-24 ENCOUNTER — Other Ambulatory Visit: Payer: Self-pay

## 2022-08-13 ENCOUNTER — Ambulatory Visit: Payer: Self-pay | Admitting: Internal Medicine

## 2022-09-25 ENCOUNTER — Other Ambulatory Visit: Payer: Self-pay

## 2022-09-25 ENCOUNTER — Other Ambulatory Visit: Payer: Self-pay | Admitting: Internal Medicine

## 2022-09-25 MED ORDER — FLUTICASONE FUROATE-VILANTEROL 100-25 MCG/ACT IN AEPB
1.0000 | INHALATION_SPRAY | Freq: Every day | RESPIRATORY_TRACT | 0 refills | Status: DC
  Filled 2022-09-25: qty 60, 30d supply, fill #0

## 2022-09-26 ENCOUNTER — Other Ambulatory Visit: Payer: Self-pay

## 2022-10-14 ENCOUNTER — Ambulatory Visit: Payer: Self-pay | Attending: Internal Medicine | Admitting: Nurse Practitioner

## 2022-10-21 ENCOUNTER — Other Ambulatory Visit: Payer: Self-pay

## 2022-11-19 ENCOUNTER — Other Ambulatory Visit: Payer: Self-pay | Admitting: *Deleted

## 2022-11-19 ENCOUNTER — Ambulatory Visit: Payer: Self-pay | Admitting: *Deleted

## 2022-11-19 DIAGNOSIS — J4540 Moderate persistent asthma, uncomplicated: Secondary | ICD-10-CM

## 2022-11-19 NOTE — Telephone Encounter (Signed)
  Chief Complaint: SOB chest soreness with breathing  Symptoms: sx started Sunday shortness of breath, thinks from pollen. Causes chest soreness when taking deep breath. Has been using albuterol inhaler more than prescribed. Using 3-4 times in an hour. Prescribed to use every 4 hours. On last puff of Breo ellipta Frequency: Sunday 11/17/22 Pertinent Negatives: Patient denies chest pain no difficulty breathing at rest. No fever reported no sweating  Disposition: [] ED /[] Urgent Care (no appt availability in office) / [] Appointment(In office/virtual)/ []  [facility]/ [] Home Care/ [] Refused Recommended Disposition /[x] [place] [facility]/ []  Follow-up with PCP Additional Notes:   Recommended mobile bus today and gave address. Patient requesting refills until next appt.12/02/22. please advise if refills can be given for breo ellipta and albuterol . Patient reports she is using up the "blue" inhaler.     Reason for Disposition  [1] MILD difficulty breathing (e.g., minimal/no SOB at rest, SOB with walking, pulse <100) AND [2] NEW-onset or WORSE than normal  Answer Assessment - Initial Assessment Questions 1. RESPIRATORY STATUS: "Describe your breathing?" (e.g., wheezing, shortness of breath, unable to speak, severe coughing)      Shortness of breath, wheezing a little 2. ONSET: "When did this breathing problem begin?"      Sunday  3. PATTERN "Does the difficult breathing come and go, or has it been constant since it started?"      Comes and goes  4. SEVERITY: "How bad is your breathing?" (e.g., mild, moderate, severe)    - MILD: No SOB at rest, mild SOB with walking, speaks normally in sentences, can lie down, no retractions, pulse < 100.    - MODERATE: SOB at rest, SOB with minimal exertion and prefers to sit, cannot lie down flat, speaks in phrases, mild retractions, audible wheezing, pulse 100-120.    - SEVERE: Very SOB at rest, speaks in single words, struggling to breathe, sitting  hunched forward, retractions, pulse > 120      Chest soreness with taking deep breath. Using inhalers more than directed 5. RECURRENT SYMPTOM: "Have you had difficulty breathing before?" If Yes, ask: "When was the last time?" and "What happened that time?"      Yes  6. CARDIAC HISTORY: "Do you have any history of heart disease?" (e.g., heart attack, angina, bypass surgery, angioplasty)      na 7. LUNG HISTORY: "Do you have any history of lung disease?"  (e.g., pulmonary embolus, asthma, emphysema)     Hx 8. CAUSE: "What do you think is causing the breathing problem?"      Using too  9. OTHER SYMPTOMS: "Do you have any other symptoms? (e.g., dizziness, runny nose, cough, chest pain, fever)     Thinks pollen  10. O2 SATURATION MONITOR:  "Do you use an oxygen saturation monitor (pulse oximeter) at home?" If Yes, ask: "What is your reading (oxygen level) today?" "What is your usual oxygen saturation reading?" (e.g., 95%)       na 11. PREGNANCY: "Is there any chance you are pregnant?" "When was your last menstrual period?"       na 12. TRAVEL: "Have you traveled out of the country in the last month?" (e.g., travel history, exposures)       na  Protocols used: Breathing Difficulty-A-AH

## 2022-11-20 ENCOUNTER — Other Ambulatory Visit: Payer: Self-pay

## 2022-11-20 ENCOUNTER — Other Ambulatory Visit: Payer: Self-pay | Admitting: Internal Medicine

## 2022-11-20 DIAGNOSIS — J4540 Moderate persistent asthma, uncomplicated: Secondary | ICD-10-CM

## 2022-11-20 NOTE — Telephone Encounter (Signed)
Noted  

## 2022-11-20 NOTE — Telephone Encounter (Signed)
Requested medication (s) are due for refill today: yes to both  Requested medication (s) are on the active medication list: yes  Last refill:  Breo: 09/25/22 #60   Albuterol: 06/06/22 6.7 grams 1 RF  Future visit scheduled: yes  Notes to clinic:  has appt in 1 week note attached that pt needs to be seen before refill request   Requested Prescriptions  Pending Prescriptions Disp Refills   fluticasone furoate-vilanterol (BREO ELLIPTA) 100-25 MCG/ACT AEPB 60 each 0    Sig: Inhale 1 puff into the lungs daily.     Pulmonology:  Combination Products Failed - 11/19/2022 10:02 AM      Failed - Valid encounter within last 12 months    Recent Outpatient Visits           1 year ago Moderate persistent asthma without complication   [place] [facility] Jonah Blue B, MD   3 years ago Moderate persistent asthma without complication   [place] [facility] Marcine Matar, MD   4 years ago Secondary amenorrhea   [place] [facility] Jonah Blue B, MD   4 years ago Moderate persistent asthma without complication   [place] [facility] Marcine Matar, MD   6 years ago Insomnia, unspecified type   [facility] Marcine Matar, MD       Future Appointments             In 1 week Claiborne Rigg, NP [place]             albuterol (VENTOLIN HFA) 108 (90 Base) MCG/ACT inhaler 6.7 g 1    Sig: Inhale 2 puffs into the lungs every 4 (four) hours as needed for wheezing or shortness of breath.     Pulmonology:  Beta Agonists 2 Failed - 11/19/2022 10:02 AM      Failed - Last BP in normal range    BP Readings from Last 1 Encounters:  02/16/21 (!) 132/93         Failed - Valid encounter within last 12 months    Recent Outpatient Visits           1 year ago Moderate persistent asthma without  complication   [place] [facility] Jonah Blue B, MD   3 years ago Moderate persistent asthma without complication   [place] [facility] Marcine Matar, MD   4 years ago Secondary amenorrhea   [place] [facility] Jonah Blue B, MD   4 years ago Moderate persistent asthma without complication   [place] [facility] Marcine Matar, MD   6 years ago Insomnia, unspecified type   [facility] Marcine Matar, MD       Future Appointments             In 1 week Claiborne Rigg, NP [facility]            Passed - Last Heart Rate in normal range    Pulse Readings from Last 1 Encounters:  02/16/21 71

## 2022-11-21 ENCOUNTER — Emergency Department (HOSPITAL_COMMUNITY): Payer: PRIVATE HEALTH INSURANCE

## 2022-11-21 ENCOUNTER — Other Ambulatory Visit: Payer: Self-pay

## 2022-11-21 ENCOUNTER — Encounter (HOSPITAL_COMMUNITY): Payer: Self-pay | Admitting: *Deleted

## 2022-11-21 ENCOUNTER — Emergency Department (HOSPITAL_COMMUNITY)
Admission: EM | Admit: 2022-11-21 | Discharge: 2022-11-21 | Disposition: A | Discharge status: Home / Self Care | Payer: PRIVATE HEALTH INSURANCE | Attending: Emergency Medicine | Admitting: Emergency Medicine

## 2022-11-21 ENCOUNTER — Other Ambulatory Visit: Payer: Self-pay | Admitting: Internal Medicine

## 2022-11-21 DIAGNOSIS — R1030 Lower abdominal pain, unspecified: Secondary | ICD-10-CM

## 2022-11-21 DIAGNOSIS — X58XXXA Exposure to other specified factors, initial encounter: Secondary | ICD-10-CM | POA: Insufficient documentation

## 2022-11-21 DIAGNOSIS — S39011A Strain of muscle, fascia and tendon of abdomen, initial encounter: Secondary | ICD-10-CM | POA: Insufficient documentation

## 2022-11-21 DIAGNOSIS — N760 Acute vaginitis: Secondary | ICD-10-CM | POA: Insufficient documentation

## 2022-11-21 DIAGNOSIS — B9689 Other specified bacterial agents as the cause of diseases classified elsewhere: Secondary | ICD-10-CM | POA: Insufficient documentation

## 2022-11-21 LAB — URINALYSIS, ROUTINE W REFLEX MICROSCOPIC
Bilirubin Urine: NEGATIVE
Glucose, UA: NEGATIVE mg/dL
Ketones, ur: NEGATIVE mg/dL
Leukocytes,Ua: NEGATIVE
Nitrite: NEGATIVE
Protein, ur: NEGATIVE mg/dL
Specific Gravity, Urine: 1.014 (ref 1.005–1.030)
pH: 6 (ref 5.0–8.0)

## 2022-11-21 LAB — COMPREHENSIVE METABOLIC PANEL
ALT: 38 U/L (ref 0–44)
AST: 23 U/L (ref 15–41)
Albumin: 3.7 g/dL (ref 3.5–5.0)
Alkaline Phosphatase: 68 U/L (ref 38–126)
Anion gap: 10 (ref 5–15)
BUN: 9 mg/dL (ref 6–20)
CO2: 24 mmol/L (ref 22–32)
Calcium: 9.4 mg/dL (ref 8.9–10.3)
Chloride: 103 mmol/L (ref 98–111)
Creatinine, Ser: 0.92 mg/dL (ref 0.44–1.00)
GFR, Estimated: >60 mL/min (ref >60)
Glucose, Bld: 111 mg/dL — ABNORMAL HIGH (ref 70–99)
Potassium: 3.9 mmol/L (ref 3.5–5.1)
Sodium: 137 mmol/L (ref 135–145)
Total Bilirubin: 0.2 mg/dL — ABNORMAL LOW (ref 0.3–1.2)
Total Protein: 6.9 g/dL (ref 6.5–8.1)

## 2022-11-21 LAB — CBC
HCT: 43 % (ref 36.0–46.0)
Hemoglobin: 14.3 g/dL (ref 12.0–15.0)
MCH: 30 pg (ref 26.0–34.0)
MCHC: 33.3 g/dL (ref 30.0–36.0)
MCV: 90.3 fL (ref 80.0–100.0)
Platelets: 278 10*3/uL (ref 150–400)
RBC: 4.76 MIL/uL (ref 3.87–5.11)
RDW: 12.5 % (ref 11.5–15.5)
WBC: 10.3 10*3/uL (ref 4.0–10.5)
nRBC: 0 % (ref 0.0–0.2)

## 2022-11-21 LAB — GC/CHLAMYDIA PROBE AMP (~~LOC~~) NOT AT ARMC
Chlamydia: NEGATIVE
Comment: NEGATIVE
Comment: NORMAL
Neisseria Gonorrhea: NEGATIVE

## 2022-11-21 LAB — WET PREP, GENITAL
Sperm: NONE SEEN
Trich, Wet Prep: NONE SEEN
WBC, Wet Prep HPF POC: <10 (ref <10)
Yeast Wet Prep HPF POC: NONE SEEN

## 2022-11-21 LAB — I-STAT BETA HCG BLOOD, ED (MC, WL, AP ONLY): I-stat hCG, quantitative: <5 m[IU]/mL (ref <5)

## 2022-11-21 LAB — LIPASE, BLOOD: Lipase: 37 U/L (ref 11–51)

## 2022-11-21 MED ORDER — KETOROLAC TROMETHAMINE 15 MG/ML IJ SOLN
15.0000 mg | Freq: Once | INTRAMUSCULAR | Status: AC
  Administered 2022-11-21: 15 mg via INTRAVENOUS
  Filled 2022-11-21: qty 1

## 2022-11-21 MED ORDER — IOHEXOL 350 MG/ML SOLN
75.0000 mL | Freq: Once | INTRAVENOUS | Status: AC | PRN
  Administered 2022-11-21: 75 mL via INTRAVENOUS

## 2022-11-21 MED ORDER — OXYCODONE-ACETAMINOPHEN 5-325 MG PO TABS
1.0000 | ORAL_TABLET | Freq: Once | ORAL | Status: AC
  Administered 2022-11-21: 1 via ORAL
  Filled 2022-11-21: qty 1

## 2022-11-21 MED ORDER — METRONIDAZOLE 500 MG PO TABS
500.0000 mg | ORAL_TABLET | Freq: Two times a day (BID) | ORAL | 0 refills | Status: DC
  Filled 2022-11-21: qty 14, 7d supply, fill #0

## 2022-11-21 MED ORDER — SODIUM CHLORIDE 0.9 % IV BOLUS
1000.0000 mL | Freq: Once | INTRAVENOUS | Status: AC
  Administered 2022-11-21: 1000 mL via INTRAVENOUS

## 2022-11-21 NOTE — Discharge Instructions (Addendum)
Today the cause of your abdominal pain is likely secondary to a muscle strain, however we discussed the risk of PID, with this and you would like to hold off until we get the gonorrhea chlamydia results back.  However you do have bacterial vaginosis, so sent some Flagyl to your pharmacy, please make sure you take it in completion, to get rid of the BV.  Return to the ER if you feel like your pain is getting worse, you have intractable nausea, vomiting, or have large amounts of blood in your urine.

## 2022-11-21 NOTE — ED Notes (Signed)
Discharge instructions discussed with pt. Verbalized understanding. VSS. No questions or concerns regarding discharge  

## 2022-11-21 NOTE — ED Triage Notes (Signed)
The pt has had rt flank and lower abd pain since yesterday and she noticed some bright red blood on tissue when she urinated no urinary frequency;  lmp last month some sl nausea

## 2022-11-21 NOTE — ED Provider Notes (Signed)
[place] EMERGENCY DEPARTMENT AT [facility] Provider Note   CSN: [id number] Arrival date & time: 11/21/22  0034     History  Chief Complaint  Patient presents with   Flank Pain    Catherine Porter is a 32 y.o. female, no pertinent past medical history, who presents to the ED secondary to bilateral back pain radiating to the right flank, that is been going on for the last day.  She states this comes and goes, and severe at times, and stabbing in nature, associated with some blood in the urine.  Denies any dysuria, urinary frequency, urgency.  Is not having any abnormal vaginal discharge.  Denies any nausea, vomiting.  Has not taken anything for this pain.  Has never had this pain before.  Is sexually active with her ex-husband, but states that she does not know if he is having sex with other people    Home Medications Prior to Admission medications   Medication Sig Start Date End Date Taking? Authorizing Provider  metroNIDAZOLE (FLAGYL) 500 MG tablet Take 1 tablet (500 mg total) by mouth 2 (two) times daily. 11/21/22  Yes Ivie Savitt L, PA  albuterol (VENTOLIN HFA) 108 (90 Base) MCG/ACT inhaler Inhale 2 puffs into the lungs every 4 (four) hours as needed for wheezing or shortness of breath. 06/06/22   Marcine Matar, MD  fluticasone furoate-vilanterol (BREO ELLIPTA) 100-25 MCG/ACT AEPB Inhale 1 puff into the lungs daily. 09/25/22   Marcine Matar, MD      Allergies    Patient has no known allergies.    Review of Systems   Review of Systems  Genitourinary:  Positive for flank pain. Negative for vaginal discharge.    Physical Exam Updated Vital Signs BP (!) 132/102 (BP Location: Right Arm)   Pulse 60   Temp 97.8 F (36.6 C) (Oral)   Resp 18   Ht 5\' 6"  (1.676 m)   Wt 102.1 kg   LMP 10/23/2022   SpO2 100%   BMI 36.33 kg/m  Physical Exam Vitals and nursing note reviewed.  Constitutional:      General: She is not in acute distress.    Appearance: She  is well-developed.  HENT:     Head: Normocephalic and atraumatic.  Eyes:     Conjunctiva/sclera: Conjunctivae normal.  Cardiovascular:     Rate and Rhythm: Normal rate and regular rhythm.     Heart sounds: No murmur heard. Pulmonary:     Effort: Pulmonary effort is normal. No respiratory distress.     Breath sounds: Normal breath sounds.  Abdominal:     Palpations: Abdomen is soft.     Tenderness: There is abdominal tenderness in the right lower quadrant. There is right CVA tenderness.  Musculoskeletal:        General: No swelling.     Cervical back: Neck supple.  Skin:    General: Skin is warm and dry.     Capillary Refill: Capillary refill takes less than 2 seconds.  Neurological:     Mental Status: She is alert.  Psychiatric:        Mood and Affect: Mood normal.     ED Results / Procedures / Treatments   Labs (all labs ordered are listed, but only abnormal results are displayed) Labs Reviewed  WET PREP, GENITAL - Abnormal; Notable for the following components:      Result Value   Clue Cells Wet Prep HPF POC PRESENT (*)    All other  components within normal limits  COMPREHENSIVE METABOLIC PANEL - Abnormal; Notable for the following components:   Glucose, Bld 111 (*)    Total Bilirubin 0.2 (*)    All other components within normal limits  URINALYSIS, ROUTINE W REFLEX MICROSCOPIC - Abnormal; Notable for the following components:   Hgb urine dipstick Julian Askin (*)    Bacteria, UA RARE (*)    All other components within normal limits  LIPASE, BLOOD  CBC  I-STAT BETA HCG BLOOD, ED ([facility], [facility], [facility] ONLY)  GC/CHLAMYDIA PROBE AMP ([place]) NOT AT [facility]    EKG None  Radiology CT ABDOMEN PELVIS W CONTRAST  Result Date: 11/21/2022 CLINICAL DATA:  Abdominal/flank pain, stone suspected. Right flank, lower abdominal pain. EXAM: CT ABDOMEN AND PELVIS WITH CONTRAST TECHNIQUE: Multidetector CT imaging of the abdomen and pelvis was performed using the standard protocol following  bolus administration of intravenous contrast. RADIATION DOSE REDUCTION: This exam was performed according to the departmental dose-optimization program which includes automated exposure control, adjustment of the mA and/or kV according to patient size and/or use of iterative reconstruction technique. CONTRAST:  75mL OMNIPAQUE IOHEXOL 350 MG/ML SOLN COMPARISON:  07/04/2015 FINDINGS: Lower chest: No acute abnormality Hepatobiliary: No focal liver abnormality is seen. Status post cholecystectomy. No biliary dilatation. Pancreas: No focal abnormality or ductal dilatation. Spleen: No focal abnormality.  Normal size. Adrenals/Urinary Tract: No adrenal abnormality. No focal renal abnormality. No stones or hydronephrosis. Urinary bladder is unremarkable. Stomach/Bowel: Normal appendix. Stomach, large and Crystalee Ventress bowel grossly unremarkable. Vascular/Lymphatic: No evidence of aneurysm or adenopathy. Reproductive: Uterus and adnexa unremarkable.  No mass. Other: No free fluid or free air. Musculoskeletal: No acute bony abnormality. IMPRESSION: No acute findings in the abdomen or pelvis. Electronically Signed   By: Charlett Nose M.D.   On: 11/21/2022 03:52    Procedures Procedures    Medications Ordered in ED Medications  oxyCODONE-acetaminophen (PERCOCET/ROXICET) 5-325 MG per tablet 1 tablet (1 tablet Oral Given 11/21/22 0106)  ketorolac (TORADOL) 15 MG/ML injection 15 mg (15 mg Intravenous Given 11/21/22 0344)  sodium chloride 0.9 % bolus 1,000 mL (0 mLs Intravenous Stopped 11/21/22 0452)  iohexol (OMNIPAQUE) 350 MG/ML injection 75 mL (75 mLs Intravenous Contrast Given 11/21/22 0338)    ED Course/ Medical Decision Making/ A&P Clinical Course as of 11/21/22 0452  Thu Nov 21, 2022  0437 Ketones, ur: NEGATIVE [BS]    Clinical Course User Index [BS] Symphanie Cederberg, Harley Alto, PA                             Medical Decision Making Patient is a 32 year old, here for low back pain radiating to the abdomenx1 day.  Denies any  nausea, vomiting, diarrhea, fever, chills.  Did have blood in her urine however today.  Given the pain that is intermittent, and it appears to be colicky nature we will obtain CT abdomen pelvis, for further evaluation, and if this is negative, we will obtain GC, wet prep for further evaluation.  Amount and/or Complexity of Data Reviewed Labs: ordered. Decision-making details documented in ED Course.    Details: Wet prep positive for clue cells, negative for trichomoniasis, labs within normal limits Radiology: ordered.    Details: CT abdomen pelvis shows no acute findings Discussion of management or test interpretation with external provider(s): Discussed with patient, negative CT, she is positive for clue cells.  She declined pelvic exam.  However did state that using the swab was not painful and  she has not had pain with sex.  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  I believe that her pain is likely secondary to an abdominal strain.  However given that she is positive for clue cells we will treat her for BV.  Given that she has no pain with sex, she is not tender to her cervix, with swabs, I think PID is less likely.  We discussed that I cannot definitively rule this out, discussed treatment anyway, she declined at this time, and would like to wait for GC to come back.  We discussed return precautions she voiced understanding.  Risk Prescription drug management.    Final Clinical Impression(s) / ED Diagnoses Final diagnoses:  Strain of abdominal muscle, initial encounter  Lower abdominal pain  Bacterial vaginosis    Rx / DC Orders ED Discharge Orders          Ordered    metroNIDAZOLE (FLAGYL) 500 MG tablet  2 times daily        11/21/22 0447              Seiji Wiswell, Harley Alto, PA 11/21/22 0458    Gilda Crease, MD 11/22/22 [phone]

## 2022-11-21 NOTE — ED Notes (Signed)
Patient transported to CT 

## 2022-12-02 ENCOUNTER — Other Ambulatory Visit: Payer: Self-pay

## 2022-12-02 ENCOUNTER — Encounter: Payer: Self-pay | Admitting: Nurse Practitioner

## 2022-12-02 ENCOUNTER — Ambulatory Visit: Payer: Self-pay | Attending: Nurse Practitioner | Admitting: Nurse Practitioner

## 2022-12-02 VITALS — BP 130/86 | HR 72 | Ht 66.0 in | Wt 225.2 lb

## 2022-12-02 DIAGNOSIS — I10 Essential (primary) hypertension: Secondary | ICD-10-CM

## 2022-12-02 DIAGNOSIS — J4541 Moderate persistent asthma with (acute) exacerbation: Secondary | ICD-10-CM

## 2022-12-02 MED ORDER — ALBUTEROL SULFATE HFA 108 (90 BASE) MCG/ACT IN AERS
2.0000 | INHALATION_SPRAY | RESPIRATORY_TRACT | 1 refills | Status: DC | PRN
Start: 2022-12-02 — End: 2023-07-02
  Filled 2022-12-02: qty 6.7, 17d supply, fill #0
  Filled 2023-02-25: qty 6.7, 17d supply, fill #1

## 2022-12-02 MED ORDER — AMLODIPINE BESYLATE 5 MG PO TABS
5.0000 mg | ORAL_TABLET | Freq: Every day | ORAL | 1 refills | Status: AC
Start: 2022-12-02 — End: ?
  Filled 2022-12-02: qty 90, 90d supply, fill #0

## 2022-12-02 MED ORDER — FLUTICASONE FUROATE-VILANTEROL 100-25 MCG/ACT IN AEPB
1.0000 | INHALATION_SPRAY | Freq: Every day | RESPIRATORY_TRACT | 6 refills | Status: DC
Start: 2022-12-02 — End: 2023-09-03
  Filled 2022-12-02 (×2): qty 60, 60d supply, fill #0
  Filled 2022-12-10: qty 60, 30d supply, fill #0
  Filled 2023-02-25: qty 60, 30d supply, fill #1
  Filled 2023-03-21: qty 180, 90d supply, fill #2
  Filled 2023-07-02: qty 120, 60d supply, fill #3

## 2022-12-02 MED ORDER — PREDNISONE 20 MG PO TABS
40.0000 mg | ORAL_TABLET | Freq: Every day | ORAL | 0 refills | Status: AC
Start: 2022-12-02 — End: 2022-12-05
  Filled 2022-12-02: qty 6, 3d supply, fill #0

## 2022-12-02 NOTE — Progress Notes (Signed)
Assessment & Plan:  Catherine Porter was seen today for medication refill and asthma.  Diagnoses and all orders for this visit:  Moderate persistent asthma with acute exacerbation -     albuterol (VENTOLIN HFA) 108 (90 Base) MCG/ACT inhaler; Inhale 2 puffs into the lungs every 4 (four) hours as needed for wheezing or shortness of breath. -     fluticasone furoate-vilanterol (BREO ELLIPTA) 100-25 MCG/ACT AEPB; Inhale 1 puff into the lungs daily. -     predniSONE (DELTASONE) 20 MG tablet; Take 2 tablets (40 mg total) by mouth daily with breakfast for 3 days.  Primary hypertension -     amLODipine (NORVASC) 5 MG tablet; Take 1 tablet (5 mg total) by mouth daily. Continue all antihypertensives as prescribed.  Reminded to bring in blood pressure log for follow  up appointment.  RECOMMENDATIONS: DASH/Mediterranean Diets are healthier choices for HTN.      Patient has been counseled on age-appropriate routine health concerns for screening and prevention. These are reviewed and up-to-date. Referrals have been placed accordingly. Immunizations are up-to-date or declined.    Subjective:   Chief Complaint  Patient presents with   Medication Refill   Asthma   Medication Refill Pertinent negatives include no chest pain, coughing, fever, headaches, myalgias, nausea or vomiting.  Asthma She complains of shortness of breath and wheezing. There is no cough. Pertinent negatives include no chest pain, fever, headaches, heartburn, malaise/fatigue, myalgias or weight loss. Her past medical history is significant for asthma.   Catherine Porter 32 y.o. female presents to office today for inhaler refill and with asthma exacerbation.    She has a PMH of asthma, gestational diabetes, migraine, HTN   HTN Blood pressure is elevated and has been elevated for quite some time. She was prescribed amlodipine over 7 years ago by another provider with this office but can not recall if she ever took this medication. She  also questions if blood pressure is elevated due to not eating today and not haven slept this morning.  BP Readings from Last 3 Encounters:  12/02/22 130/86  11/21/22 (!) 132/102  02/16/21 (!) 132/93     Asthma Current symptoms include wheezing. Associated symptoms include  none . The patient has been suffering from these symptoms for several days. Symptoms have been unchanged since their onset. Medications used in the past to treat these symptoms include beta agonist inhalers and combination beta agonists/steroid inhalers. Suspected precipitants include dust, occupational exposure, and hot temperature . Patient has not required Emergency Room treatment for these symptoms    Review of Systems  Constitutional:  Negative for fever, malaise/fatigue and weight loss.  HENT: Negative.  Negative for nosebleeds.   Eyes: Negative.  Negative for blurred vision, double vision and photophobia.  Respiratory:  Positive for shortness of breath and wheezing. Negative for cough.   Cardiovascular: Negative.  Negative for chest pain, palpitations and leg swelling.  Gastrointestinal: Negative.  Negative for heartburn, nausea and vomiting.  Musculoskeletal: Negative.  Negative for myalgias.  Neurological: Negative.  Negative for dizziness, focal weakness, seizures and headaches.  Psychiatric/Behavioral: Negative.  Negative for suicidal ideas.     Past Medical History:  Diagnosis Date   Arthritis    "fingers" (06/20/2015)   Asthma    Gestational HTN 2012   when pregnant with son    Migraine    "not often anymore" (06/20/2015)    Past Surgical History:  Procedure Laterality Date   LAPAROSCOPIC CHOLECYSTECTOMY  2014   TONSILLECTOMY AND  ADENOIDECTOMY  ~ 2008   TONSILLECTOMY/ADENOIDECTOMY/TURBINATE REDUCTION  ~ 2008    Family History  Problem Relation Age of Onset   Anesthesia problems Neg Hx    Asthma Neg Hx     Social History Reviewed with no changes to be made today.   Outpatient Medications  Prior to Visit  Medication Sig Dispense Refill   albuterol (VENTOLIN HFA) 108 (90 Base) MCG/ACT inhaler Inhale 2 puffs into the lungs every 4 (four) hours as needed for wheezing or shortness of breath. 6.7 g 1   fluticasone furoate-vilanterol (BREO ELLIPTA) 100-25 MCG/ACT AEPB Inhale 1 puff into the lungs daily. 60 each 0   metroNIDAZOLE (FLAGYL) 500 MG tablet Take 1 tablet (500 mg total) by mouth 2 (two) times daily. 14 tablet 0   No facility-administered medications prior to visit.    No Known Allergies     Objective:    BP 130/86   Pulse 72   Ht 5\' 6"  (1.676 m)   Wt 225 lb 3.2 oz (102.2 kg)   LMP 10/23/2022   SpO2 97%   BMI 36.35 kg/m  Wt Readings from Last 3 Encounters:  12/02/22 225 lb 3.2 oz (102.2 kg)  11/21/22 225 lb 1.4 oz (102.1 kg)  02/16/21 225 lb (102.1 kg)    Physical Exam Vitals and nursing note reviewed.  Constitutional:      Appearance: She is well-developed.  HENT:     Head: Normocephalic and atraumatic.  Cardiovascular:     Rate and Rhythm: Normal rate and regular rhythm.     Heart sounds: Normal heart sounds. No murmur heard.    No friction rub. No gallop.  Pulmonary:     Effort: Pulmonary effort is normal. No tachypnea or respiratory distress.     Breath sounds: Wheezing present. No decreased breath sounds, rhonchi or rales.  Chest:     Chest wall: No tenderness.  Abdominal:     General: Bowel sounds are normal.     Palpations: Abdomen is soft.  Musculoskeletal:        General: Normal range of motion.     Cervical back: Normal range of motion.  Skin:    General: Skin is warm and dry.  Neurological:     Mental Status: She is alert and oriented to person, place, and time.     Coordination: Coordination normal.  Psychiatric:        Behavior: Behavior normal. Behavior is cooperative.        Thought Content: Thought content normal.        Judgment: Judgment normal.          Patient has been counseled extensively about nutrition and  exercise as well as the importance of adherence with medications and regular follow-up. The patient was given clear instructions to go to ER or return to medical center if symptoms don't improve, worsen or new problems develop. The patient verbalized understanding.   Follow-up: Return in about 4 weeks (around 12/30/2022) for bp check with luke. See johnson in 3 months HTN.   Claiborne Rigg, FNP-BC [facility] [place], [place] [phone]   12/02/2022, 3:30 PM

## 2022-12-04 ENCOUNTER — Other Ambulatory Visit: Payer: Self-pay

## 2022-12-06 ENCOUNTER — Other Ambulatory Visit: Payer: Self-pay

## 2022-12-10 ENCOUNTER — Other Ambulatory Visit: Payer: Self-pay

## 2022-12-13 ENCOUNTER — Other Ambulatory Visit: Payer: Self-pay

## 2022-12-20 ENCOUNTER — Other Ambulatory Visit: Payer: Self-pay

## 2022-12-30 ENCOUNTER — Ambulatory Visit: Payer: Self-pay | Admitting: Pharmacist

## 2023-02-25 ENCOUNTER — Other Ambulatory Visit (HOSPITAL_COMMUNITY): Payer: Self-pay

## 2023-02-25 ENCOUNTER — Other Ambulatory Visit: Payer: Self-pay

## 2023-03-10 ENCOUNTER — Ambulatory Visit: Payer: Self-pay | Admitting: Internal Medicine

## 2023-03-21 ENCOUNTER — Other Ambulatory Visit: Payer: Self-pay

## 2023-03-26 ENCOUNTER — Other Ambulatory Visit: Payer: Self-pay

## 2023-04-02 ENCOUNTER — Other Ambulatory Visit: Payer: Self-pay

## 2023-05-12 ENCOUNTER — Other Ambulatory Visit: Payer: Self-pay

## 2023-06-02 ENCOUNTER — Other Ambulatory Visit: Payer: Self-pay

## 2023-07-02 ENCOUNTER — Other Ambulatory Visit: Payer: Self-pay

## 2023-07-02 ENCOUNTER — Other Ambulatory Visit: Payer: Self-pay | Admitting: Nurse Practitioner

## 2023-07-02 DIAGNOSIS — J4541 Moderate persistent asthma with (acute) exacerbation: Secondary | ICD-10-CM

## 2023-07-02 MED ORDER — ALBUTEROL SULFATE HFA 108 (90 BASE) MCG/ACT IN AERS
2.0000 | INHALATION_SPRAY | RESPIRATORY_TRACT | 0 refills | Status: DC | PRN
Start: 2023-07-02 — End: 2023-09-03
  Filled 2023-07-02 – 2023-08-19 (×2): qty 6.7, 17d supply, fill #0

## 2023-07-14 ENCOUNTER — Other Ambulatory Visit: Payer: Self-pay

## 2023-08-19 ENCOUNTER — Other Ambulatory Visit: Payer: Self-pay

## 2023-08-19 ENCOUNTER — Other Ambulatory Visit: Payer: Self-pay | Admitting: Nurse Practitioner

## 2023-08-19 DIAGNOSIS — J4541 Moderate persistent asthma with (acute) exacerbation: Secondary | ICD-10-CM

## 2023-08-22 ENCOUNTER — Other Ambulatory Visit: Payer: Self-pay

## 2023-09-01 ENCOUNTER — Other Ambulatory Visit: Payer: Self-pay

## 2023-09-03 ENCOUNTER — Other Ambulatory Visit: Payer: Self-pay

## 2023-09-03 ENCOUNTER — Ambulatory Visit: Payer: Self-pay | Attending: Physician Assistant | Admitting: Physician Assistant

## 2023-09-03 ENCOUNTER — Encounter: Payer: Self-pay | Admitting: Physician Assistant

## 2023-09-03 VITALS — BP 124/82 | HR 77 | Temp 98.5°F | Wt 221.0 lb

## 2023-09-03 DIAGNOSIS — J4541 Moderate persistent asthma with (acute) exacerbation: Secondary | ICD-10-CM

## 2023-09-03 DIAGNOSIS — R030 Elevated blood-pressure reading, without diagnosis of hypertension: Secondary | ICD-10-CM

## 2023-09-03 DIAGNOSIS — R739 Hyperglycemia, unspecified: Secondary | ICD-10-CM

## 2023-09-03 MED ORDER — CETIRIZINE HCL 10 MG PO TABS
10.0000 mg | ORAL_TABLET | Freq: Every day | ORAL | 1 refills | Status: DC
  Filled 2023-09-03: qty 90, 90d supply, fill #0

## 2023-09-03 MED ORDER — ALBUTEROL SULFATE HFA 108 (90 BASE) MCG/ACT IN AERS
2.0000 | INHALATION_SPRAY | RESPIRATORY_TRACT | 2 refills | Status: DC | PRN
  Filled 2023-09-03: qty 18, 17d supply, fill #0
  Filled 2023-10-28: qty 18, 17d supply, fill #1
  Filled 2024-02-24: qty 18, 17d supply, fill #2

## 2023-09-03 MED ORDER — FLUTICASONE FUROATE-VILANTEROL 100-25 MCG/ACT IN AEPB
1.0000 | INHALATION_SPRAY | Freq: Every day | RESPIRATORY_TRACT | 6 refills | Status: DC
  Filled 2023-09-03: qty 60, 30d supply, fill #0
  Filled 2023-10-28: qty 180, 90d supply, fill #1
  Filled 2024-02-24: qty 180, 90d supply, fill #2

## 2023-09-03 NOTE — Progress Notes (Signed)
 Refill albuterol and breo Does not take amlodipine

## 2023-09-03 NOTE — Patient Instructions (Signed)
 work at a goal of eliminating sugary drinks, candy, desserts, sweets, refined sugars, processed foods, and white carbohydrates.   Drink 64 to 80 ounces water daily  Normal Blood pressure is <130/<85

## 2023-09-03 NOTE — Progress Notes (Signed)
 Patient ID: Catherine Porter, female   DOB: [birth year], 33 y.o.   MRN: [id number]   Catherine Porter, is a 33 y.o. female  LKG:[id number]  GUY:[id number]  DOB - 02-24-1991  Chief Complaint  Patient presents with   Medication Refill       Subjective:   Catherine Porter is a 33 y.o. female here today for med RF.  She has not taken amlodipine in months.  She just stopped taking it bc she ran out of it.  Denies HA/dizziness/CP.  Works 3rd shift for [employer] and likes working 3rd shift.  She says wheezing is increased due to allergies.  Denies s/sx of illness.  Uses albuterol 2 to 3 times a week and sometimes not at all.     No problems updated.  ALLERGIES: No Known Allergies  PAST MEDICAL HISTORY: Past Medical History:  Diagnosis Date   Arthritis    "fingers" (06/20/2015)   Asthma    Gestational HTN 2012   when pregnant with son    Migraine    "not often anymore" (06/20/2015)    MEDICATIONS AT HOME: Prior to Admission medications   Medication Sig Start Date End Date Taking? Authorizing Provider  cetirizine (ZYRTEC) 10 MG tablet Take 1 tablet (10 mg total) by mouth daily. 09/03/23  Yes Anders Simmonds, PA-C  albuterol (VENTOLIN HFA) 108 (90 Base) MCG/ACT inhaler Inhale 2 puffs into the lungs every 4 (four) hours as needed for wheezing or shortness of breath. 09/03/23   Anders Simmonds, PA-C  amLODipine (NORVASC) 5 MG tablet Take 1 tablet (5 mg total) by mouth daily. Patient not taking: Reported on 09/03/2023 12/02/22   Claiborne Rigg, NP  fluticasone furoate-vilanterol (BREO ELLIPTA) 100-25 MCG/ACT AEPB Inhale 1 puff into the lungs daily. 09/03/23   Jolinda Pinkstaff, Marzella Schlein, PA-C    ROS: Neg HEENT Neg cardiac Neg GI Neg GU Neg MS Neg psych Neg neuro  Objective:   Vitals:   09/03/23 1412 09/03/23 1437  BP: (!) 136/93 124/82  Pulse: 77   Temp: 98.5 F (36.9 C)   TempSrc: Oral   Weight: 221 lb (100.2 kg)    Exam General appearance : Awake, alert, not in any distress. Speech  Clear. Not toxic looking HEENT: Atraumatic and Normocephalic Neck: Supple, no JVD. No cervical lymphadenopathy.  Chest: Good air entry bilaterally, CTAB.  No rales/rhonchi/wheezing CVS: S1 S2 regular, no murmurs.  Extremities: B/L Lower Ext shows no edema, both legs are warm to touch Neurology: Awake alert, and oriented X 3, CN II-XII intact, Non focal Skin: No Rash  Data Review Lab Results  Component Value Date   HGBA1C 5.50 07/17/2015   HGBA1C 5.7 (H) 08/09/2014    Assessment & Plan   1. Moderate persistent asthma with acute exacerbation - fluticasone furoate-vilanterol (BREO ELLIPTA) 100-25 MCG/ACT AEPB; Inhale 1 puff into the lungs daily.  Dispense: 60 each; Refill: 6 - albuterol (VENTOLIN HFA) 108 (90 Base) MCG/ACT inhaler; Inhale 2 puffs into the lungs every 4 (four) hours as needed for wheezing or shortness of breath.  Dispense: 18 g; Refill: 2 - cetirizine (ZYRTEC) 10 MG tablet; Take 1 tablet (10 mg total) by mouth daily.  Dispense: 90 tablet; Refill: 1  2. Elevated BP without diagnosis of hypertension (Primary) Second BP controlled.  Monitor OOO - Basic Metabolic Panel  3. High blood sugar Work at a goal of eliminating sugary drinks, candy, desserts, sweets, refined sugars, processed foods, and white carbohydrates.   - Basic Metabolic Panel - Hemoglobin  A1c    Return in about 1 year (around 09/02/2024), or if symptoms worsen or fail to improve.  The patient was given clear instructions to go to ER or return to medical center if symptoms don't improve, worsen or new problems develop. The patient verbalized understanding. The patient was told to call to get lab results if they haven't heard anything in the next week.      Georgian Co, PA-C [facility] [place], [place] [phone]   09/03/2023, 2:38 PM

## 2023-09-04 ENCOUNTER — Encounter: Payer: Self-pay | Admitting: Physician Assistant

## 2023-09-04 LAB — BASIC METABOLIC PANEL WITH GFR
BUN/Creatinine Ratio: 13 (ref 9–23)
BUN: 9 mg/dL (ref 6–20)
CO2: 25 mmol/L (ref 20–29)
Calcium: 9.7 mg/dL (ref 8.7–10.2)
Chloride: 103 mmol/L (ref 96–106)
Creatinine, Ser: 0.7 mg/dL (ref 0.57–1.00)
Glucose: 67 mg/dL — ABNORMAL LOW (ref 70–99)
Potassium: 4.4 mmol/L (ref 3.5–5.2)
Sodium: 143 mmol/L (ref 134–144)
eGFR: 119 mL/min/{1.73_m2} (ref >59)

## 2023-09-04 LAB — HEMOGLOBIN A1C
Est. average glucose Bld gHb Est-mCnc: 114 mg/dL
Hgb A1c MFr Bld: 5.6 % (ref 4.8–5.6)

## 2023-09-15 ENCOUNTER — Other Ambulatory Visit: Payer: Self-pay

## 2023-09-15 ENCOUNTER — Emergency Department (HOSPITAL_COMMUNITY)
Admission: EM | Admit: 2023-09-15 | Discharge: 2023-09-15 | Disposition: A | Discharge status: Home / Self Care | Payer: Self-pay | Attending: Emergency Medicine | Admitting: Emergency Medicine

## 2023-09-15 ENCOUNTER — Encounter (HOSPITAL_COMMUNITY): Payer: Self-pay | Admitting: Emergency Medicine

## 2023-09-15 ENCOUNTER — Emergency Department (HOSPITAL_COMMUNITY): Payer: Self-pay

## 2023-09-15 DIAGNOSIS — S8001XA Contusion of right knee, initial encounter: Secondary | ICD-10-CM | POA: Insufficient documentation

## 2023-09-15 DIAGNOSIS — W010XXA Fall on same level from slipping, tripping and stumbling without subsequent striking against object, initial encounter: Secondary | ICD-10-CM | POA: Insufficient documentation

## 2023-09-15 DIAGNOSIS — S8002XA Contusion of left knee, initial encounter: Secondary | ICD-10-CM | POA: Insufficient documentation

## 2023-09-15 DIAGNOSIS — R030 Elevated blood-pressure reading, without diagnosis of hypertension: Secondary | ICD-10-CM | POA: Insufficient documentation

## 2023-09-15 MED ORDER — ACETAMINOPHEN 500 MG PO TABS
1000.0000 mg | ORAL_TABLET | Freq: Once | ORAL | Status: AC
  Administered 2023-09-15: 1000 mg via ORAL
  Filled 2023-09-15: qty 2

## 2023-09-15 MED ORDER — METHOCARBAMOL 750 MG PO TABS
750.0000 mg | ORAL_TABLET | Freq: Three times a day (TID) | ORAL | 0 refills | Status: DC | PRN
  Filled 2023-09-15: qty 15, 5d supply, fill #0

## 2023-09-15 MED ORDER — IBUPROFEN 400 MG PO TABS
400.0000 mg | ORAL_TABLET | Freq: Once | ORAL | Status: AC
  Administered 2023-09-15: 400 mg via ORAL
  Filled 2023-09-15: qty 1

## 2023-09-15 NOTE — ED Triage Notes (Signed)
 Pt tripped and fell last night landed on both knees and felt them both pop. Pt was able to get up and walk after falling but states today both knees have been swelling and hurts to walk. Denies taking anything for pain.

## 2023-09-15 NOTE — ED Notes (Signed)
 Pt provided two ice packs for her knees

## 2023-09-15 NOTE — Discharge Instructions (Addendum)
 It was our pleasure to provide your ER care today - we hope that you feel better.  Your xrays look good - no fracture is noted. As we discussed, there is the possibility of soft tissue injury. Icepack to sore area. Take acetaminophen or ibuprofen as need for pain.  You may also try robaxin as need for muscle pain/spasm - no driving when taking.  Follow up with orthopedic doctor in 1-2 weeks if symptoms fail to improve/resolve - call office to arrange appointment.   Your blood pressure is high today - follow up with primary care doctor in the next couple weeks for recheck.   Return to ER if worse, new/severe pain, fevers, numbness/weakness, or other concern.

## 2023-09-15 NOTE — ED Provider Notes (Signed)
 [facility] AT [facility] Provider Note   CSN: [id number] Arrival date & time: 09/15/23  1145     History  Chief Complaint  Patient presents with   Knee Pain    Catherine Porter is a 33 y.o. female.  Pt with c/o mechanical fall last night, landed on front of both knees. Has been ambulatory since. C/o pain to both knees. No specific twisting injury recalled. Denies faintness or dizziness prior to fall, was mechanical fall. No loc. No head injury or headache. No neck or back pain. No other extremity pain or injury. Skin intact. No numbness/weakness. No hip or ankle pain. No anticoag use.   The history is provided by the patient and medical records.  Knee Pain Associated symptoms: no back pain, no fever and no neck pain        Home Medications Prior to Admission medications   Medication Sig Start Date End Date Taking? Authorizing Provider  albuterol (VENTOLIN HFA) 108 (90 Base) MCG/ACT inhaler Inhale 2 puffs into the lungs every 4 (four) hours as needed for wheezing or shortness of breath. 09/03/23   Anders Simmonds, PA-C  amLODipine (NORVASC) 5 MG tablet Take 1 tablet (5 mg total) by mouth daily. Patient not taking: Reported on 09/03/2023 12/02/22   Claiborne Rigg, NP  cetirizine (ZYRTEC) 10 MG tablet Take 1 tablet (10 mg total) by mouth daily. 09/03/23   Anders Simmonds, PA-C  fluticasone furoate-vilanterol (BREO ELLIPTA) 100-25 MCG/ACT AEPB Inhale 1 puff into the lungs daily. 09/03/23   Anders Simmonds, PA-C      Allergies    Patient has no known allergies.    Review of Systems   Review of Systems  Constitutional:  Negative for fever.  Respiratory:  Negative for shortness of breath.   Cardiovascular:  Negative for chest pain.  Gastrointestinal:  Negative for abdominal pain.  Musculoskeletal:  Negative for back pain and neck pain.  Skin:  Negative for wound.  Neurological:  Negative for weakness, numbness and headaches.    Physical  Exam Updated Vital Signs BP (!) 154/101   Pulse 69   Temp 98.2 F (36.8 C)   Resp 18   Ht 1.676 m (5\' 6" )   Wt 100.2 kg   LMP 08/18/2023 (Exact Date)   SpO2 100%   BMI 35.67 kg/m  Physical Exam Vitals and nursing note reviewed.  Constitutional:      Appearance: Normal appearance. She is well-developed.  HENT:     Head: Atraumatic.     Nose: Nose normal.     Mouth/Throat:     Mouth: Mucous membranes are moist.  Eyes:     General: No scleral icterus.    Conjunctiva/sclera: Conjunctivae normal.  Neck:     Trachea: No tracheal deviation.  Cardiovascular:     Rate and Rhythm: Normal rate.     Pulses: Normal pulses.  Pulmonary:     Effort: Pulmonary effort is normal. No respiratory distress.  Musculoskeletal:        General: No swelling.     Cervical back: Neck supple. No tenderness. No muscular tenderness.     Comments: CTLS spine, non tender, aligned, no step off. Tenderness bil knees anteriorly. No effusion. Bil knees are grossly stable on exam. No pain w rom at hip or ankle. Distal pulses palp bil lower extremities, 2+. No significant sts noted to thigh or lower leg.   Skin:    General: Skin is warm and dry.  Findings: No rash.  Neurological:     Mental Status: She is alert.     Comments: Alert, speech normal.   Psychiatric:        Mood and Affect: Mood normal.     ED Results / Procedures / Treatments   Labs (all labs ordered are listed, but only abnormal results are displayed) Labs Reviewed - No data to display  EKG None  Radiology DG Knee Complete 4 Views Left Result Date: 09/15/2023 CLINICAL DATA:  Fall, pain. EXAM: RIGHT KNEE - COMPLETE 4 VIEW; LEFT KNEE - COMPLETE 4 VIEW COMPARISON:  None Available. FINDINGS: No evidence of fracture, dislocation, or joint effusion. No evidence of arthropathy or other focal bone abnormality. Soft tissues are unremarkable. IMPRESSION: Negative. Electronically Signed   By: Layla Maw M.D.   On: 09/15/2023 12:44   DG  Knee Complete 4 Views Right Result Date: 09/15/2023 CLINICAL DATA:  Fall, pain. EXAM: RIGHT KNEE - COMPLETE 4 VIEW; LEFT KNEE - COMPLETE 4 VIEW COMPARISON:  None Available. FINDINGS: No evidence of fracture, dislocation, or joint effusion. No evidence of arthropathy or other focal bone abnormality. Soft tissues are unremarkable. IMPRESSION: Negative. Electronically Signed   By: Layla Maw M.D.   On: 09/15/2023 12:44    Procedures Procedures    Medications Ordered in ED Medications  ibuprofen (ADVIL) tablet 400 mg (has no administration in time range)  acetaminophen (TYLENOL) tablet 1,000 mg (has no administration in time range)    ED Course/ Medical Decision Making/ A&P                                 Medical Decision Making Problems Addressed: Contusion of left knee, initial encounter: acute illness or injury Contusion of right knee, initial encounter: acute illness or injury Elevated blood pressure reading: acute illness or injury Fall from slip, trip, or stumble, initial encounter: acute illness or injury with systemic symptoms that poses a threat to life or bodily functions  Amount and/or Complexity of Data Reviewed External Data Reviewed: notes. Radiology: ordered and independent interpretation performed. Decision-making details documented in ED Course.  Risk OTC drugs. Prescription drug management.   Imaging.   Reviewed nursing notes and prior charts for additional history.   No meds prior to arrival or today.   Acetaminophen po, ibupfofen po. Po fluids. Icepack.  Discussed ortho f/u 1-2 weeks if symptoms fail to improve/resolve.  Bp is high. Rec pcp f/u.          Final Clinical Impression(s) / ED Diagnoses Final diagnoses:  None    Rx / DC Orders ED Discharge Orders     None         Cathren Laine, MD 09/15/23 1534

## 2023-09-16 ENCOUNTER — Other Ambulatory Visit: Payer: Self-pay

## 2023-10-27 ENCOUNTER — Other Ambulatory Visit: Payer: Self-pay

## 2023-10-28 ENCOUNTER — Other Ambulatory Visit: Payer: Self-pay

## 2023-10-29 ENCOUNTER — Other Ambulatory Visit: Payer: Self-pay

## 2024-02-16 ENCOUNTER — Other Ambulatory Visit: Payer: Self-pay

## 2024-02-24 ENCOUNTER — Other Ambulatory Visit (HOSPITAL_COMMUNITY): Payer: Self-pay

## 2024-02-24 ENCOUNTER — Other Ambulatory Visit: Payer: Self-pay

## 2024-02-26 ENCOUNTER — Other Ambulatory Visit: Payer: Self-pay

## 2024-03-01 ENCOUNTER — Other Ambulatory Visit: Payer: Self-pay

## 2024-07-09 ENCOUNTER — Other Ambulatory Visit: Payer: Self-pay

## 2024-07-09 ENCOUNTER — Other Ambulatory Visit: Payer: Self-pay | Admitting: Internal Medicine

## 2024-07-09 DIAGNOSIS — J4541 Moderate persistent asthma with (acute) exacerbation: Secondary | ICD-10-CM

## 2024-07-12 ENCOUNTER — Other Ambulatory Visit: Payer: Self-pay

## 2024-07-13 ENCOUNTER — Other Ambulatory Visit: Payer: Self-pay | Admitting: Internal Medicine

## 2024-07-13 ENCOUNTER — Other Ambulatory Visit: Payer: Self-pay

## 2024-07-13 DIAGNOSIS — J4541 Moderate persistent asthma with (acute) exacerbation: Secondary | ICD-10-CM

## 2024-07-13 NOTE — Telephone Encounter (Unsigned)
 Copied from CRM [id number]. Topic: Clinical - Medication Question >> Jul 13, 2024 11:55 AM Gustabo D wrote: Pt wants to know if this can be filled before her appt in March because she is out and can't wait that long-  fluticasone  furoate-vilanterol (BREO ELLIPTA ) 100-25 MCG/ACT AEPB albuterol  (VENTOLIN  HFA) 108 (90 Base) MCG/ACT inhaler     [facility] [address] [place] [address] Phone: [phone] Fax: [phone] Hours: M-F 7:30a-7:00p

## 2024-07-14 ENCOUNTER — Ambulatory Visit: Payer: Self-pay | Admitting: Nurse Practitioner

## 2024-07-14 ENCOUNTER — Encounter: Payer: Self-pay | Admitting: Nurse Practitioner

## 2024-07-14 ENCOUNTER — Other Ambulatory Visit: Payer: Self-pay

## 2024-07-14 ENCOUNTER — Ambulatory Visit: Payer: Self-pay

## 2024-07-14 VITALS — BP 119/74 | HR 73 | Ht 66.0 in | Wt 201.0 lb

## 2024-07-14 DIAGNOSIS — J4541 Moderate persistent asthma with (acute) exacerbation: Secondary | ICD-10-CM

## 2024-07-14 MED ORDER — ALBUTEROL SULFATE HFA 108 (90 BASE) MCG/ACT IN AERS
2.0000 | INHALATION_SPRAY | RESPIRATORY_TRACT | 2 refills | Status: DC | PRN
  Filled 2024-07-14: qty 18, 17d supply, fill #0

## 2024-07-14 MED ORDER — FLUTICASONE FUROATE-VILANTEROL 100-25 MCG/ACT IN AEPB
1.0000 | INHALATION_SPRAY | Freq: Every day | RESPIRATORY_TRACT | 6 refills | Status: AC
  Filled 2024-07-14: qty 60, 30d supply, fill #0

## 2024-07-14 MED ORDER — FLUTICASONE FUROATE-VILANTEROL 100-25 MCG/ACT IN AEPB
1.0000 | INHALATION_SPRAY | Freq: Every day | RESPIRATORY_TRACT | 6 refills | Status: DC
  Filled 2024-07-14: qty 60, 60d supply, fill #0

## 2024-07-14 MED ORDER — PREDNISONE 20 MG PO TABS
20.0000 mg | ORAL_TABLET | Freq: Every day | ORAL | 0 refills | Status: AC
  Filled 2024-07-14: qty 5, 5d supply, fill #0

## 2024-07-14 MED ORDER — ALBUTEROL SULFATE HFA 108 (90 BASE) MCG/ACT IN AERS
2.0000 | INHALATION_SPRAY | RESPIRATORY_TRACT | 2 refills | Status: AC | PRN
  Filled 2024-07-14: qty 6.7, 17d supply, fill #0

## 2024-07-14 NOTE — Telephone Encounter (Signed)
 FYI Only or Action Required?: FYI only for provider: appointment scheduled on 2/4.  Patient was last seen in primary care on 09/03/2023 by Danton Jon HERO, PA-C.  Called Nurse Triage reporting Shortness of Breath and Wheezing.  Symptoms began x 2 days ago.  Interventions attempted: Nothing.  Symptoms are: unchanged.  Triage Disposition: See HCP Within 4 Hours (Or PCP Triage)  Patient/caregiver understands and will follow disposition?: Yes    Message from [place] F sent at 07/14/2024  8:59 AM EST  Reason for Triage: Patient having wheezing, shortness of breath - needs the fluticasonefuroate-vilanterol (BREO ELLIPTA ) 100-25 MCG/ACT AEPB and albuterol  (VENTOLIN  HFA) 108 (90 Base) MCG/ACT inhaler. Appt 08/16/24   Reason for Disposition  [1] MILD difficulty breathing (e.g., minimal/no SOB at rest, SOB with walking, pulse < 100) AND [2] NEW-onset or WORSE than normal  Answer Assessment - Initial Assessment Questions 1. RESPIRATORY STATUS: Describe your breathing? (e.g., wheezing, shortness of breath, unable to speak, severe coughing)      SOB, wheezing   2. ONSET: When did this breathing problem begin?      2 days   3. PATTERN Does the difficult breathing come and go, or has it been constant since it started?      Intermittent   4. SEVERITY: How bad is your breathing? (e.g., mild, moderate, severe)      Mild-moderate  5. RECURRENT SYMPTOM: Have you had difficulty breathing before? If Yes, ask: When was the last time? and What happened that time?      Yes  6. CARDIAC HISTORY: Do you have any history of heart disease? (e.g., heart attack, angina, bypass surgery, angioplasty)      No   7. LUNG HISTORY: Do you have any history of lung disease?  (e.g., pulmonary embolus, asthma, emphysema)     Asthma   8. CAUSE: What do you think is causing the breathing problem?      Asthma   9. OTHER SYMPTOMS: Do you have any other symptoms? (e.g., chest pain, cough,  dizziness, fever, runny nose)  Cough   11. PREGNANCY: Is there any chance you are pregnant? When was your last menstrual period?       No     Patient called in to triage with complaints of SOB, wheezing, mild cough This has been ongoing for 2 days. The refill requests were sent in yesterday for the fluticasonefuroate-vilanterol (BREO ELLIPTA ) 100-25 MCG/ACT AEPB and albuterol  (VENTOLIN  HFA) 108 (90 Base) MCG/ACT inhaler.   Appointment scheduled for further evaluation; Patient agrees with the plan of care, and will reach out if symptoms worsen or persist.  Protocols used: Breathing Difficulty-A-AH

## 2024-07-14 NOTE — Progress Notes (Unsigned)
 "  Assessment & Plan:  Catherine Porter was seen today for shortness of breath and asthma.  Diagnoses and all orders for this visit:  Moderate persistent asthma with acute exacerbation -     predniSONE  (DELTASONE ) 20 MG tablet; Take 1 tablet (20 mg total) by mouth daily with breakfast for 5 days. -     fluticasone  furoate-vilanterol (BREO ELLIPTA ) 100-25 MCG/ACT AEPB; Inhale 1 puff into the lungs daily. -     albuterol  (VENTOLIN  HFA) 108 (90 Base) MCG/ACT inhaler; Inhale 2 puffs into the lungs every 4 (four) hours as needed for wheezing or shortness of breath.    Patient has been counseled on age-appropriate routine health concerns for screening and prevention. These are reviewed and up-to-date. Referrals have been placed accordingly. Immunizations are up-to-date or declined.    Subjective:   Chief Complaint  Patient presents with   Shortness of Breath   Asthma    Catherine Porter 34 y.o. female presents to office today with mild asthma exacerbation.   She is a patient of Dr. Vicci  Catherine Porter has been without her asthma medications (both inhalers) for quite some time. She is currently experiencing cough, shortness of breath and wheezing. She is requesting for BREO and SABA to be filled today.      Review of Systems  Constitutional: Negative.   HENT: Negative.    Respiratory:  Positive for cough, shortness of breath and wheezing.   Cardiovascular: Negative.   Neurological:  Negative for dizziness and headaches.    Past Medical History:  Diagnosis Date   Arthritis    fingers (06/20/2015)   Asthma    Gestational HTN 2012   when pregnant with son    Migraine    not often anymore (06/20/2015)    Past Surgical History:  Procedure Laterality Date   LAPAROSCOPIC CHOLECYSTECTOMY  2014   TONSILLECTOMY AND ADENOIDECTOMY  ~ 2008   TONSILLECTOMY/ADENOIDECTOMY/TURBINATE REDUCTION  ~ 2008    Family History  Problem Relation Age of Onset   Anesthesia problems Neg Hx    Asthma  Neg Hx     Social History Reviewed with no changes to be made today.   Outpatient Medications Prior to Visit  Medication Sig Dispense Refill   albuterol  (VENTOLIN  HFA) 108 (90 Base) MCG/ACT inhaler Inhale 2 puffs into the lungs every 4 (four) hours as needed for wheezing or shortness of breath. 18 g 2   fluticasone  furoate-vilanterol (BREO ELLIPTA ) 100-25 MCG/ACT AEPB Inhale 1 puff into the lungs daily. 60 each 6   amLODipine  (NORVASC ) 5 MG tablet Take 1 tablet (5 mg total) by mouth daily. (Patient not taking: Reported on 07/14/2024) 90 tablet 1   cetirizine  (ZYRTEC ) 10 MG tablet Take 1 tablet (10 mg total) by mouth daily. 90 tablet 1   methocarbamol  (ROBAXIN ) 750 MG tablet Take 1 tablet (750 mg total) by mouth 3 (three) times daily as needed (muscle spasm/pain). (Patient not taking: Reported on 07/14/2024) 15 tablet 0   No facility-administered medications prior to visit.    Allergies[1]     Objective:    BP 119/74 (BP Location: Left Arm, Patient Position: Sitting, Cuff Size: Normal)   Pulse 73   Ht 5' 6 (1.676 m)   Wt 201 lb (91.2 kg)   LMP 07/14/2024 (Exact Date)   SpO2 98%   BMI 32.44 kg/m  Wt Readings from Last 3 Encounters:  07/14/24 201 lb (91.2 kg)  09/15/23 221 lb (100.2 kg)  09/03/23 221 lb (100.2 kg)  Physical Exam HENT:     Head: Normocephalic.  Neck:     Thyroid: No thyromegaly.  Cardiovascular:     Rate and Rhythm: Normal rate and regular rhythm.  Pulmonary:     Effort: No accessory muscle usage, prolonged expiration, respiratory distress or retractions.     Breath sounds: Decreased air movement present. Decreased breath sounds and wheezing present.  Neurological:     Mental Status: She is alert.          Patient has been counseled extensively about nutrition and exercise as well as the importance of adherence with medications and regular follow-up. The patient was given clear instructions to go to ER or return to medical center if symptoms don't  improve, worsen or new problems develop. The patient verbalized understanding.   Follow-up: Return if symptoms worsen or fail to improve.   Haze LELON Servant, FNP-BC [facility] [place], [place] [phone]   07/15/2024, 10:03 PM     [1] No Known Allergies  "

## 2024-07-15 ENCOUNTER — Encounter: Payer: Self-pay | Admitting: Nurse Practitioner

## 2024-08-16 ENCOUNTER — Ambulatory Visit: Payer: Self-pay | Admitting: Internal Medicine
